# Patient Record
Sex: Male | Born: 1965 | Race: White | Hispanic: No | Marital: Married | Smoking: Never smoker
Health system: Southern US, Community
[De-identification: ages and names within clinical notes are randomized; demographics above are authoritative.]

## PROBLEM LIST (undated history)

## (undated) DIAGNOSIS — I10 Essential (primary) hypertension: Secondary | ICD-10-CM

## (undated) DIAGNOSIS — Z87442 Personal history of urinary calculi: Secondary | ICD-10-CM

## (undated) DIAGNOSIS — M1990 Unspecified osteoarthritis, unspecified site: Secondary | ICD-10-CM

## (undated) HISTORY — PX: WRIST SURGERY: SHX841

## (undated) HISTORY — PX: TOTAL SHOULDER ARTHROPLASTY: SHX126

---

## 2001-11-14 ENCOUNTER — Emergency Department (HOSPITAL_COMMUNITY): Admission: EM | Admit: 2001-11-14 | Discharge: 2001-11-15 | Payer: Self-pay | Admitting: Emergency Medicine

## 2001-11-15 ENCOUNTER — Encounter: Payer: Self-pay | Admitting: Emergency Medicine

## 2006-05-03 ENCOUNTER — Other Ambulatory Visit: Payer: Self-pay

## 2006-05-03 ENCOUNTER — Emergency Department: Payer: Self-pay | Admitting: Emergency Medicine

## 2006-05-04 ENCOUNTER — Ambulatory Visit: Payer: Self-pay | Admitting: Emergency Medicine

## 2008-04-09 ENCOUNTER — Other Ambulatory Visit: Payer: Self-pay

## 2008-04-09 ENCOUNTER — Emergency Department: Payer: Self-pay | Admitting: Emergency Medicine

## 2011-12-30 ENCOUNTER — Inpatient Hospital Stay: Payer: Self-pay | Admitting: Internal Medicine

## 2011-12-30 DIAGNOSIS — G459 Transient cerebral ischemic attack, unspecified: Secondary | ICD-10-CM

## 2011-12-30 LAB — CBC
HGB: 15.2 g/dL (ref 13.0–18.0)
MCH: 30.2 pg (ref 26.0–34.0)
MCHC: 33.5 g/dL (ref 32.0–36.0)
MCV: 90 fL (ref 80–100)
Platelet: 190 10*3/uL (ref 150–440)
RBC: 5.05 10*6/uL (ref 4.40–5.90)
WBC: 7.7 10*3/uL (ref 3.8–10.6)

## 2011-12-30 LAB — HEMOGLOBIN A1C: Hemoglobin A1C: 5.6 % (ref 4.2–6.3)

## 2011-12-30 LAB — CK TOTAL AND CKMB (NOT AT ARMC)
CK, Total: 466 U/L — ABNORMAL HIGH (ref 35–232)
CK-MB: 2.2 ng/mL (ref 0.5–3.6)

## 2011-12-30 LAB — COMPREHENSIVE METABOLIC PANEL
Albumin: 3.5 g/dL (ref 3.4–5.0)
Alkaline Phosphatase: 64 U/L (ref 50–136)
Anion Gap: 5 — ABNORMAL LOW (ref 7–16)
Bilirubin,Total: 0.4 mg/dL (ref 0.2–1.0)
Calcium, Total: 8.3 mg/dL — ABNORMAL LOW (ref 8.5–10.1)
Chloride: 108 mmol/L — ABNORMAL HIGH (ref 98–107)
Co2: 26 mmol/L (ref 21–32)
Creatinine: 0.92 mg/dL (ref 0.60–1.30)
EGFR (African American): >60
Osmolality: 278 (ref 275–301)
Total Protein: 7.4 g/dL (ref 6.4–8.2)

## 2011-12-30 LAB — LIPID PANEL
HDL Cholesterol: 46 mg/dL (ref 40–60)
Ldl Cholesterol, Calc: 102 mg/dL — ABNORMAL HIGH (ref 0–100)
VLDL Cholesterol, Calc: 16 mg/dL (ref 5–40)

## 2011-12-30 LAB — TROPONIN I
Troponin-I: <0.02 ng/mL
Troponin-I: <0.02 ng/mL

## 2013-01-31 ENCOUNTER — Emergency Department: Payer: Self-pay | Admitting: Emergency Medicine

## 2013-08-03 ENCOUNTER — Emergency Department: Payer: Self-pay | Admitting: Emergency Medicine

## 2013-08-03 LAB — COMPREHENSIVE METABOLIC PANEL
Albumin: 3.6 g/dL (ref 3.4–5.0)
Alkaline Phosphatase: 72 U/L
BUN: 14 mg/dL (ref 7–18)
Calcium, Total: 9 mg/dL (ref 8.5–10.1)
Chloride: 105 mmol/L (ref 98–107)
Creatinine: 1.14 mg/dL (ref 0.60–1.30)
EGFR (Non-African Amer.): >60
Glucose: 131 mg/dL — ABNORMAL HIGH (ref 65–99)
Osmolality: 276 (ref 275–301)
Sodium: 137 mmol/L (ref 136–145)
Total Protein: 7.6 g/dL (ref 6.4–8.2)

## 2013-08-03 LAB — CBC
HGB: 15.6 g/dL (ref 13.0–18.0)
MCH: 29.8 pg (ref 26.0–34.0)
MCHC: 33.7 g/dL (ref 32.0–36.0)
RDW: 14.4 % (ref 11.5–14.5)
WBC: 9.4 10*3/uL (ref 3.8–10.6)

## 2013-08-03 LAB — URINALYSIS, COMPLETE
Bilirubin,UR: NEGATIVE
Leukocyte Esterase: NEGATIVE
Nitrite: NEGATIVE
Ph: 5 (ref 4.5–8.0)
RBC,UR: 19 /HPF (ref 0–5)
Specific Gravity: 1.015 (ref 1.003–1.030)
Squamous Epithelial: <1
WBC UR: 2 /HPF (ref 0–5)

## 2013-08-29 ENCOUNTER — Other Ambulatory Visit: Payer: Self-pay | Admitting: Orthopedic Surgery

## 2013-09-04 NOTE — Pre-Procedure Instructions (Signed)
Hector Hancock  09/04/2013   Your procedure is scheduled on:  09/06/13  Report to [facility] short stay admitting at ? AM.  Call this number if you have problems the morning of surgery: [phone]   Remember:   Do not eat food or drink liquids after midnight.   Take these medicines the morning of surgery with A SIP OF WATER: ?   Do not wear jewelry, make-up or nail polish.  Do not wear lotions, powders, or perfumes. You may wear deodorant.  Do not shave 48 hours prior to surgery. Men may shave face and neck.  Do not bring valuables to the hospital.  [facility] is not responsible                  for any belongings or valuables.               Contacts, dentures or bridgework may not be worn into surgery.  Leave suitcase in the car. After surgery it may be brought to your room.  For patients admitted to the hospital, discharge time is determined by your                treatment team.               Patients discharged the day of surgery will not be allowed to drive  home.  Name and phone number of your driver:   Special Instructions: Incentive Spirometry - Practice and bring it with you on the day of surgery. Shower using CHG 2 nights before surgery and the night before surgery.  If you shower the day of surgery use CHG.  Use special wash - you have one bottle of CHG for all showers.  You should use approximately 1/3 of the bottle for each shower.   Please read over the following fact sheets that you were given: Pain Booklet, Coughing and Deep Breathing, Blood Transfusion Information, MRSA Information and Surgical Site Infection Prevention

## 2013-09-05 ENCOUNTER — Encounter (HOSPITAL_COMMUNITY): Payer: Self-pay | Admitting: Pharmacy Technician

## 2013-09-05 ENCOUNTER — Encounter (HOSPITAL_COMMUNITY): Payer: Self-pay | Admitting: Certified Registered Nurse Anesthetist

## 2013-09-05 ENCOUNTER — Encounter (HOSPITAL_COMMUNITY)
Admission: RE | Admit: 2013-09-05 | Discharge: 2013-09-05 | Disposition: A | Discharge status: Home / Self Care | Payer: Worker's Compensation | Source: Ambulatory Visit | Attending: Orthopedic Surgery | Admitting: Orthopedic Surgery

## 2013-09-05 ENCOUNTER — Encounter (HOSPITAL_COMMUNITY): Payer: Self-pay

## 2013-09-05 HISTORY — DX: Essential (primary) hypertension: I10

## 2013-09-05 HISTORY — DX: Personal history of urinary calculi: Z87.442

## 2013-09-05 HISTORY — DX: Unspecified osteoarthritis, unspecified site: M19.90

## 2013-09-05 LAB — COMPREHENSIVE METABOLIC PANEL
ALT: 32 U/L (ref 0–53)
AST: 25 U/L (ref 0–37)
Albumin: 3.6 g/dL (ref 3.5–5.2)
Alkaline Phosphatase: 61 U/L (ref 39–117)
BUN: 13 mg/dL (ref 6–23)
CALCIUM: 8.8 mg/dL (ref 8.4–10.5)
CO2: 25 mEq/L (ref 19–32)
Chloride: 104 mEq/L (ref 96–112)
Creatinine, Ser: 0.97 mg/dL (ref 0.50–1.35)
GFR calc Af Amer: >90 mL/min (ref >90)
GFR calc non Af Amer: >90 mL/min (ref >90)
Glucose, Bld: 115 mg/dL — ABNORMAL HIGH (ref 70–99)
Potassium: 4.2 mEq/L (ref 3.7–5.3)
SODIUM: 142 meq/L (ref 137–147)
TOTAL PROTEIN: 7.2 g/dL (ref 6.0–8.3)
Total Bilirubin: 0.5 mg/dL (ref 0.3–1.2)

## 2013-09-05 LAB — CBC WITH DIFFERENTIAL/PLATELET
BASOS ABS: 0 10*3/uL (ref 0.0–0.1)
BASOS PCT: 0 % (ref 0–1)
EOS ABS: 0.1 10*3/uL (ref 0.0–0.7)
EOS PCT: 1 % (ref 0–5)
HCT: 46.1 % (ref 39.0–52.0)
Hemoglobin: 15.6 g/dL (ref 13.0–17.0)
Lymphocytes Relative: 26 % (ref 12–46)
Lymphs Abs: 1.7 10*3/uL (ref 0.7–4.0)
MCH: 30.4 pg (ref 26.0–34.0)
MCHC: 33.8 g/dL (ref 30.0–36.0)
MCV: 89.7 fL (ref 78.0–100.0)
Monocytes Absolute: 0.7 10*3/uL (ref 0.1–1.0)
Monocytes Relative: 10 % (ref 3–12)
Neutro Abs: 4.2 10*3/uL (ref 1.7–7.7)
Neutrophils Relative %: 63 % (ref 43–77)
PLATELETS: 217 10*3/uL (ref 150–400)
RBC: 5.14 MIL/uL (ref 4.22–5.81)
RDW: 14.4 % (ref 11.5–15.5)
WBC: 6.8 10*3/uL (ref 4.0–10.5)

## 2013-09-05 LAB — URINALYSIS, ROUTINE W REFLEX MICROSCOPIC
Bilirubin Urine: NEGATIVE
Glucose, UA: NEGATIVE mg/dL
HGB URINE DIPSTICK: NEGATIVE
KETONES UR: NEGATIVE mg/dL
Leukocytes, UA: NEGATIVE
Nitrite: NEGATIVE
PROTEIN: NEGATIVE mg/dL
Specific Gravity, Urine: 1.022 (ref 1.005–1.030)
UROBILINOGEN UA: 0.2 mg/dL (ref 0.0–1.0)
pH: 5.5 (ref 5.0–8.0)

## 2013-09-05 LAB — PROTIME-INR
INR: 0.96 (ref 0.00–1.49)
Prothrombin Time: 12.6 seconds (ref 11.6–15.2)

## 2013-09-05 LAB — ABO/RH: ABO/RH(D): A POS

## 2013-09-05 LAB — APTT: aPTT: 28 seconds (ref 24–37)

## 2013-09-05 LAB — SURGICAL PCR SCREEN
MRSA, PCR: NEGATIVE
STAPHYLOCOCCUS AUREUS: NEGATIVE

## 2013-09-05 MED ORDER — CEFAZOLIN SODIUM 10 G IJ SOLR
3.0000 g | INTRAMUSCULAR | Status: AC
  Administered 2013-09-06: 3 g via INTRAVENOUS
  Filled 2013-09-05: qty 3000

## 2013-09-05 NOTE — Pre-Procedure Instructions (Signed)
Hector Hancock  09/05/2013   Your procedure is scheduled on:  Wednesday September 06, 2012 at 11:46 AM.  Report to [facility] Short Stay Admitting at 8:45 AM.  Call this number if you have problems the morning of surgery: [phone]   Remember:   Do not eat food or drink liquids after midnight.   Take these medicines the morning of surgery with A SIP OF WATER: Hydrocodone if needed for pain   Do not wear jewelry.  Do not wear lotions, powders, or colognes. You may wear deodorant.  Men may shave face and neck.  Do not bring valuables to the hospital.  [facility] is not responsible for any belongings or valuables.               Contacts, dentures or bridgework may not be worn into surgery.  Leave suitcase in the car. After surgery it may be brought to your room.  For patients admitted to the hospital, discharge time is determined by your treatment team.               Patients discharged the day of surgery will not be allowed to drive home.  Name and phone number of your driver: Family/Friend  Special Instructions: Shower using CHG tonight and tomorrow morning before surgery.  If you shower the day of surgery use CHG soap only.  Use special wash - you have one bottle of CHG for all showers.  You should use approximately 1/3 of the bottle for each shower.   Please read over the following fact sheets that you were given: Pain Booklet, Coughing and Deep Breathing, Blood Transfusion Information, MRSA Information and Surgical Site Infection Prevention

## 2013-09-05 NOTE — Progress Notes (Signed)
Surgery time has been changed to 8:30am (per the OR Scheduler).........Marland Kitchenwill be here at 6:30AM

## 2013-09-05 NOTE — Progress Notes (Signed)
No Stress test found at [facility].

## 2013-09-05 NOTE — Progress Notes (Signed)
Patient informed Nurse that he had a stress test at [facility] in the last 5 years. Will request records. Patient denied having a cardiac cath or sleep study.

## 2013-09-05 NOTE — Progress Notes (Signed)
09/05/13 1149  OBSTRUCTIVE SLEEP APNEA  Score 4 or greater  Results sent to PCP   This patient has screened at risk for sleep apnea using the STOP Bang tool used during a pre-surgical visit. A score of 4 or greater is at risk for sleep apnea.

## 2013-09-06 ENCOUNTER — Encounter (HOSPITAL_COMMUNITY): Payer: Self-pay | Admitting: *Deleted

## 2013-09-06 ENCOUNTER — Ambulatory Visit (HOSPITAL_COMMUNITY)
Admission: RE | Admit: 2013-09-06 | Discharge: 2013-09-06 | Disposition: A | Discharge status: Home / Self Care | Payer: Worker's Compensation | Source: Ambulatory Visit | Attending: Orthopedic Surgery | Admitting: Orthopedic Surgery

## 2013-09-06 ENCOUNTER — Ambulatory Visit (HOSPITAL_COMMUNITY): Payer: Worker's Compensation | Admitting: Certified Registered Nurse Anesthetist

## 2013-09-06 ENCOUNTER — Encounter (HOSPITAL_COMMUNITY)
Admission: RE | Disposition: A | Discharge status: Home / Self Care | Payer: Self-pay | Source: Ambulatory Visit | Attending: Orthopedic Surgery

## 2013-09-06 ENCOUNTER — Ambulatory Visit (HOSPITAL_COMMUNITY): Payer: Worker's Compensation

## 2013-09-06 ENCOUNTER — Encounter (HOSPITAL_COMMUNITY): Payer: Worker's Compensation | Admitting: Certified Registered Nurse Anesthetist

## 2013-09-06 DIAGNOSIS — Z01818 Encounter for other preprocedural examination: Secondary | ICD-10-CM | POA: Insufficient documentation

## 2013-09-06 DIAGNOSIS — M533 Sacrococcygeal disorders, not elsewhere classified: Secondary | ICD-10-CM | POA: Insufficient documentation

## 2013-09-06 DIAGNOSIS — Z01810 Encounter for preprocedural cardiovascular examination: Secondary | ICD-10-CM | POA: Insufficient documentation

## 2013-09-06 DIAGNOSIS — M129 Arthropathy, unspecified: Secondary | ICD-10-CM | POA: Insufficient documentation

## 2013-09-06 DIAGNOSIS — Z01812 Encounter for preprocedural laboratory examination: Secondary | ICD-10-CM | POA: Insufficient documentation

## 2013-09-06 DIAGNOSIS — I10 Essential (primary) hypertension: Secondary | ICD-10-CM | POA: Insufficient documentation

## 2013-09-06 HISTORY — PX: SACROILIAC JOINT FUSION: SHX6088

## 2013-09-06 SURGERY — SACROILIAC JOINT FUSION
Anesthesia: General | Site: Hip | Laterality: Right

## 2013-09-06 MED ORDER — MIDAZOLAM HCL 2 MG/2ML IJ SOLN
INTRAMUSCULAR | Status: AC
  Filled 2013-09-06: qty 2

## 2013-09-06 MED ORDER — PROPOFOL 10 MG/ML IV BOLUS
INTRAVENOUS | Status: DC | PRN
  Administered 2013-09-06: 270 mg via INTRAVENOUS

## 2013-09-06 MED ORDER — LIDOCAINE HCL (CARDIAC) 20 MG/ML IV SOLN
INTRAVENOUS | Status: AC
  Filled 2013-09-06: qty 5

## 2013-09-06 MED ORDER — HYDROMORPHONE HCL PF 1 MG/ML IJ SOLN
0.2500 mg | INTRAMUSCULAR | Status: DC | PRN
  Administered 2013-09-06 (×2): 0.25 mg via INTRAVENOUS
  Administered 2013-09-06: 0.5 mg via INTRAVENOUS

## 2013-09-06 MED ORDER — POVIDONE-IODINE 7.5 % EX SOLN
Freq: Once | CUTANEOUS | Status: DC
  Filled 2013-09-06: qty 118

## 2013-09-06 MED ORDER — FENTANYL CITRATE 0.05 MG/ML IJ SOLN
INTRAMUSCULAR | Status: DC | PRN
  Administered 2013-09-06: 100 ug via INTRAVENOUS
  Administered 2013-09-06: 50 ug via INTRAVENOUS

## 2013-09-06 MED ORDER — BUPIVACAINE-EPINEPHRINE (PF) 0.25% -1:200000 IJ SOLN
INTRAMUSCULAR | Status: AC
  Filled 2013-09-06: qty 30

## 2013-09-06 MED ORDER — 0.9 % SODIUM CHLORIDE (POUR BTL) OPTIME
TOPICAL | Status: DC | PRN
  Administered 2013-09-06: 1000 mL

## 2013-09-06 MED ORDER — EPINEPHRINE HCL 0.1 MG/ML IJ SOSY
PREFILLED_SYRINGE | INTRAMUSCULAR | Status: AC
  Filled 2013-09-06: qty 10

## 2013-09-06 MED ORDER — MIDAZOLAM HCL 5 MG/5ML IJ SOLN
INTRAMUSCULAR | Status: DC | PRN
  Administered 2013-09-06: 2 mg via INTRAVENOUS

## 2013-09-06 MED ORDER — FENTANYL CITRATE 0.05 MG/ML IJ SOLN: 50.0000 ug | Freq: Once | INTRAMUSCULAR | Status: DC

## 2013-09-06 MED ORDER — PHENYLEPHRINE 40 MCG/ML (10ML) SYRINGE FOR IV PUSH (FOR BLOOD PRESSURE SUPPORT)
PREFILLED_SYRINGE | INTRAVENOUS | Status: AC
  Filled 2013-09-06: qty 10

## 2013-09-06 MED ORDER — ARTIFICIAL TEARS OP OINT
TOPICAL_OINTMENT | OPHTHALMIC | Status: AC
  Filled 2013-09-06: qty 3.5

## 2013-09-06 MED ORDER — HYDROMORPHONE HCL PF 1 MG/ML IJ SOLN
INTRAMUSCULAR | Status: AC
  Filled 2013-09-06: qty 1

## 2013-09-06 MED ORDER — OXYCODONE HCL 5 MG/5ML PO SOLN: 5.0000 mg | Freq: Once | ORAL | Status: AC | PRN

## 2013-09-06 MED ORDER — BUPIVACAINE-EPINEPHRINE PF 0.25-1:200000 % IJ SOLN
INTRAMUSCULAR | Status: DC | PRN
  Administered 2013-09-06: 17 mL

## 2013-09-06 MED ORDER — ROCURONIUM BROMIDE 100 MG/10ML IV SOLN
INTRAVENOUS | Status: DC | PRN
  Administered 2013-09-06: 50 mg via INTRAVENOUS

## 2013-09-06 MED ORDER — SODIUM CHLORIDE 0.9 % IJ SOLN
INTRAMUSCULAR | Status: AC
  Filled 2013-09-06: qty 10

## 2013-09-06 MED ORDER — LACTATED RINGERS IV SOLN
INTRAVENOUS | Status: DC | PRN
  Administered 2013-09-06 (×2): via INTRAVENOUS

## 2013-09-06 MED ORDER — LIDOCAINE HCL (CARDIAC) 20 MG/ML IV SOLN
INTRAVENOUS | Status: DC | PRN
  Administered 2013-09-06: 100 mg via INTRAVENOUS

## 2013-09-06 MED ORDER — ROCURONIUM BROMIDE 50 MG/5ML IV SOLN
INTRAVENOUS | Status: AC
  Filled 2013-09-06: qty 1

## 2013-09-06 MED ORDER — ATROPINE SULFATE 0.1 MG/ML IJ SOLN
INTRAMUSCULAR | Status: AC
  Filled 2013-09-06: qty 10

## 2013-09-06 MED ORDER — OXYCODONE HCL 5 MG PO TABS
ORAL_TABLET | ORAL | Status: AC
  Filled 2013-09-06: qty 1

## 2013-09-06 MED ORDER — EPHEDRINE SULFATE 50 MG/ML IJ SOLN
INTRAMUSCULAR | Status: AC
  Filled 2013-09-06: qty 1

## 2013-09-06 MED ORDER — NEOSTIGMINE METHYLSULFATE 1 MG/ML IJ SOLN
INTRAMUSCULAR | Status: AC
  Filled 2013-09-06: qty 10

## 2013-09-06 MED ORDER — PHENYLEPHRINE HCL 10 MG/ML IJ SOLN
INTRAMUSCULAR | Status: DC | PRN
  Administered 2013-09-06: 40 ug via INTRAVENOUS
  Administered 2013-09-06: 80 ug via INTRAVENOUS
  Administered 2013-09-06: 40 ug via INTRAVENOUS

## 2013-09-06 MED ORDER — GLYCOPYRROLATE 0.2 MG/ML IJ SOLN
INTRAMUSCULAR | Status: DC | PRN
  Administered 2013-09-06: .8 mg via INTRAVENOUS

## 2013-09-06 MED ORDER — PROPOFOL 10 MG/ML IV BOLUS
INTRAVENOUS | Status: AC
  Filled 2013-09-06: qty 20

## 2013-09-06 MED ORDER — FENTANYL CITRATE 0.05 MG/ML IJ SOLN
INTRAMUSCULAR | Status: AC
  Filled 2013-09-06: qty 5

## 2013-09-06 MED ORDER — PHENYLEPHRINE HCL 10 MG/ML IJ SOLN
10.0000 mg | INTRAVENOUS | Status: DC | PRN
  Administered 2013-09-06: 15 ug/min via INTRAVENOUS

## 2013-09-06 MED ORDER — GLYCOPYRROLATE 0.2 MG/ML IJ SOLN
INTRAMUSCULAR | Status: AC
  Filled 2013-09-06: qty 4

## 2013-09-06 MED ORDER — ONDANSETRON HCL 4 MG/2ML IJ SOLN
INTRAMUSCULAR | Status: DC | PRN
  Administered 2013-09-06: 4 mg via INTRAVENOUS

## 2013-09-06 MED ORDER — OXYCODONE HCL 5 MG PO TABS
5.0000 mg | ORAL_TABLET | Freq: Once | ORAL | Status: AC | PRN
  Administered 2013-09-06: 5 mg via ORAL

## 2013-09-06 MED ORDER — MIDAZOLAM HCL 2 MG/2ML IJ SOLN: 1.0000 mg | INTRAMUSCULAR | Status: DC | PRN

## 2013-09-06 MED ORDER — SUCCINYLCHOLINE CHLORIDE 20 MG/ML IJ SOLN
INTRAMUSCULAR | Status: AC
  Filled 2013-09-06: qty 1

## 2013-09-06 MED ORDER — PROMETHAZINE HCL 25 MG/ML IJ SOLN: 6.2500 mg | INTRAMUSCULAR | Status: DC | PRN

## 2013-09-06 MED ORDER — NEOSTIGMINE METHYLSULFATE 1 MG/ML IJ SOLN
INTRAMUSCULAR | Status: DC | PRN
  Administered 2013-09-06: 5 mg via INTRAVENOUS

## 2013-09-06 MED ORDER — ONDANSETRON HCL 4 MG/2ML IJ SOLN
INTRAMUSCULAR | Status: AC
  Filled 2013-09-06: qty 2

## 2013-09-06 SURGICAL SUPPLY — 62 items
APL SKNCLS STERI-STRIP NONHPOA (GAUZE/BANDAGES/DRESSINGS) ×1
BENZOIN TINCTURE PRP APPL 2/3 (GAUZE/BANDAGES/DRESSINGS) ×2 IMPLANT
BLADE SURG 10 STRL SS (BLADE) ×2 IMPLANT
BLADE SURG 11 STRL SS (BLADE) ×2 IMPLANT
BLADE SURG ROTATE 9660 (MISCELLANEOUS) ×1 IMPLANT
CANISTER SUCTION 2500CC (MISCELLANEOUS) ×2 IMPLANT
CLOTH BEACON ORANGE TIMEOUT ST (SAFETY) ×2 IMPLANT
COVER SURGICAL LIGHT HANDLE (MISCELLANEOUS) ×4 IMPLANT
DRAPE C-ARM 42X72 X-RAY (DRAPES) ×1 IMPLANT
DRAPE C-ARMOR (DRAPES) ×2 IMPLANT
DRAPE INCISE IOBAN 66X45 STRL (DRAPES) ×2 IMPLANT
DRAPE POUCH INSTRU U-SHP 10X18 (DRAPES) ×2 IMPLANT
DRAPE SURG 17X23 STRL (DRAPES) ×9 IMPLANT
DRILL CANNULATED 7.5MM (BIT) ×1 IMPLANT
DURAPREP 26ML APPLICATOR (WOUND CARE) ×2 IMPLANT
ELECT CAUTERY BLADE 6.4 (BLADE) ×2 IMPLANT
ELECT REM PT RETURN 9FT ADLT (ELECTROSURGICAL) ×2
ELECTRODE REM PT RTRN 9FT ADLT (ELECTROSURGICAL) ×1 IMPLANT
GAUZE SPONGE 4X4 16PLY XRAY LF (GAUZE/BANDAGES/DRESSINGS) ×2 IMPLANT
GLOVE BIO SURGEON STRL SZ7 (GLOVE) ×2 IMPLANT
GLOVE BIO SURGEON STRL SZ8 (GLOVE) ×2 IMPLANT
GLOVE BIOGEL PI IND STRL 7.0 (GLOVE) ×1 IMPLANT
GLOVE BIOGEL PI IND STRL 8 (GLOVE) ×1 IMPLANT
GLOVE BIOGEL PI INDICATOR 7.0 (GLOVE) ×1
GLOVE BIOGEL PI INDICATOR 8 (GLOVE) ×1
GOWN STRL NON-REIN LRG LVL3 (GOWN DISPOSABLE) ×4 IMPLANT
GOWN STRL REIN XL XLG (GOWN DISPOSABLE) ×2 IMPLANT
K-WIRE 2.4X250 BLUNT TIP (WIRE) ×2
K-WIRE 2.4X300 SHARP (WIRE) ×6
KIT BASIN OR (CUSTOM PROCEDURE TRAY) ×2 IMPLANT
KIT ROOM TURNOVER OR (KITS) ×2 IMPLANT
KWIRE 2.4X250 BLUNT TIP (WIRE) IMPLANT
KWIRE 2.4X300 SHARP (WIRE) IMPLANT
MANIFOLD NEPTUNE II (INSTRUMENTS) ×2 IMPLANT
NDL HYPO 25GX1X1/2 BEV (NEEDLE) ×1 IMPLANT
NEEDLE 22X1 1/2 (OR ONLY) (NEEDLE) ×2 IMPLANT
NEEDLE HYPO 25GX1X1/2 BEV (NEEDLE) ×2 IMPLANT
NS IRRIG 1000ML POUR BTL (IV SOLUTION) ×2 IMPLANT
PACK UNIVERSAL I (CUSTOM PROCEDURE TRAY) ×2 IMPLANT
PAD ARMBOARD 7.5X6 YLW CONV (MISCELLANEOUS) ×5 IMPLANT
PENCIL BUTTON HOLSTER BLD 10FT (ELECTRODE) ×3 IMPLANT
PUTTY DBX 2.5CC (Putty) ×2 IMPLANT
PUTTY DBX 2.5CC DEPUY (Putty) IMPLANT
SCREW SI-LOK 10MM HA SL 50MM (Screw) ×2 IMPLANT
SCREW SI-LOK 10MM HA SL 60MM (Screw) ×1 IMPLANT
SPONGE GAUZE 4X4 12PLY (GAUZE/BANDAGES/DRESSINGS) ×2 IMPLANT
SPONGE GAUZE 4X4 12PLY STER LF (GAUZE/BANDAGES/DRESSINGS) ×1 IMPLANT
SPONGE LAP 18X18 X RAY DECT (DISPOSABLE) ×2 IMPLANT
STAPLER VISISTAT 35W (STAPLE) ×1 IMPLANT
STRIP CLOSURE SKIN 1/2X4 (GAUZE/BANDAGES/DRESSINGS) ×2 IMPLANT
SUT MNCRL AB 4-0 PS2 18 (SUTURE) ×2 IMPLANT
SUT VIC AB 0 CT1 18XCR BRD 8 (SUTURE) ×1 IMPLANT
SUT VIC AB 0 CT1 8-18 (SUTURE) ×2
SUT VIC AB 2-0 CT2 18 VCP726D (SUTURE) ×2 IMPLANT
SYR BULB IRRIGATION 50ML (SYRINGE) ×3 IMPLANT
SYR CONTROL 10ML LL (SYRINGE) ×2 IMPLANT
TAPE CLOTH SURG 4X10 WHT LF (GAUZE/BANDAGES/DRESSINGS) ×1 IMPLANT
TOWEL OR 17X24 6PK STRL BLUE (TOWEL DISPOSABLE) ×2 IMPLANT
TOWEL OR 17X26 10 PK STRL BLUE (TOWEL DISPOSABLE) ×4 IMPLANT
TUBE CONNECTING 12X1/4 (SUCTIONS) ×2 IMPLANT
WATER STERILE IRR 1000ML POUR (IV SOLUTION) ×2 IMPLANT
YANKAUER SUCT BULB TIP NO VENT (SUCTIONS) ×2 IMPLANT

## 2013-09-06 NOTE — H&P (Signed)
PREOPERATIVE H&P  Chief Complaint: right low back pain  HPI: Hector Hancock is a 48 y.o. male who presents with ongoing pain in the right low back. Patient obtained temporary benefit with a R SI injection. Patient failed conservative care.  Past Medical History  Diagnosis Date  . Hypertension   . History of kidney stones   . Arthritis    Past Surgical History  Procedure Laterality Date  . Total shoulder arthroplasty Left    History   Social History  . Marital Status: Married    Spouse Name: N/A    Number of Children: N/A  . Years of Education: N/A   Social History Main Topics  . Smoking status: Never Smoker   . Smokeless tobacco: None  . Alcohol Use: No  . Drug Use: No  . Sexual Activity: None   Other Topics Concern  . None   Social History Narrative  . None   History reviewed. No pertinent family history. No Known Allergies Prior to Admission medications   Medication Sig Start Date End Date Taking? Authorizing Provider  enalapril (VASOTEC) 5 MG tablet Take 5 mg by mouth daily.   Yes Historical Provider, MD  HYDROcodone-acetaminophen (NORCO/VICODIN) 5-325 MG per tablet Take 1 tablet by mouth every 6 (six) hours as needed for moderate pain.   Yes Historical Provider, MD     All other systems have been reviewed and were otherwise negative with the exception of those mentioned in the HPI and as above.  Physical Exam: Filed Vitals:   09/06/13 0730  BP:   Pulse:   Temp: 98.3 F (36.8 C)  Resp:     General: Alert, no acute distress Cardiovascular: No pedal edema Respiratory: No cyanosis, no use of accessory musculature GI: No organomegaly, abdomen is soft and non-tender Skin: No lesions in the area of chief complaint Neurologic: Sensation intact distally Psychiatric: Patient is competent for consent with normal mood and affect Lymphatic: No axillary or cervical lymphadenopathy  MUSCULOSKELETAL: + TTP right low back  Assessment/Plan: Right sided low back  pain Plan for Procedure(s): RIGHT SACROILIAC JOINT FUSION   Emilee Hero, MD 09/06/2013 8:21 AM

## 2013-09-06 NOTE — Anesthesia Preprocedure Evaluation (Addendum)
Anesthesia Evaluation  Patient identified by MRN, date of birth, ID band Patient awake    Reviewed: Allergy & Precautions, H&P , NPO status , Patient's Chart, lab work & pertinent test results  Airway Mallampati: II TM Distance: >3 FB Neck ROM: Full    Dental   Pulmonary  breath sounds clear to auscultation        Cardiovascular hypertension, Rhythm:Regular Rate:Normal     Neuro/Psych    GI/Hepatic   Endo/Other  Morbid obesity  Renal/GU      Musculoskeletal   Abdominal (+) + obese,   Peds  Hematology   Anesthesia Other Findings   Reproductive/Obstetrics                          Anesthesia Physical Anesthesia Plan  ASA: III  Anesthesia Plan: General   Post-op Pain Management:    Induction: Intravenous  Airway Management Planned: Oral ETT and Video Laryngoscope Planned  Additional Equipment:   Intra-op Plan:   Post-operative Plan: Extubation in OR  Informed Consent: I have reviewed the patients History and Physical, chart, labs and discussed the procedure including the risks, benefits and alternatives for the proposed anesthesia with the patient or authorized representative who has indicated his/her understanding and acceptance.     Plan Discussed with: CRNA and Surgeon  Anesthesia Plan Comments:         Anesthesia Quick Evaluation

## 2013-09-06 NOTE — Addendum Note (Signed)
Addendum created 09/06/13 1148 by Edmonia Caprio, CRNA   Modules edited: Anesthesia Responsible Staff

## 2013-09-06 NOTE — Transfer of Care (Signed)
Immediate Anesthesia Transfer of Care Note  Patient: Hector Hancock  Procedure(s) Performed: Procedure(s) with comments: SACROILIAC JOINT FUSION (Right) - Right sided sacroiliac joint fusion  Patient Location: PACU  Anesthesia Type:General  Level of Consciousness: awake, alert  and oriented  Airway & Oxygen Therapy: Patient Spontanous Breathing  Post-op Assessment: Report given to PACU RN  Post vital signs: Reviewed and stable  Complications: No apparent anesthesia complications

## 2013-09-06 NOTE — Anesthesia Postprocedure Evaluation (Signed)
  Anesthesia Post-op Note  Patient: Hector Hancock  Procedure(s) Performed: Procedure(s) with comments: SACROILIAC JOINT FUSION (Right) - Right sided sacroiliac joint fusion  Patient Location: PACU  Anesthesia Type:General  Level of Consciousness: awake  Airway and Oxygen Therapy: Patient Spontanous Breathing  Post-op Pain: mild  Post-op Assessment: Post-op Vital signs reviewed, Patient's Cardiovascular Status Stable, Respiratory Function Stable, Patent Airway, No signs of Nausea or vomiting and Pain level controlled  Post-op Vital Signs: Reviewed and stable  Complications: No apparent anesthesia complications

## 2013-09-06 NOTE — Op Note (Signed)
NAMEQUANTA, ROHER NO.:  [id number]  MEDICAL RECORD NO.:  [id number]  LOCATION:  [facility]                         FACILITY:  [facility]  PHYSICIAN:  Estill Bamberg, MD      DATE OF BIRTH:  [birth year]  DATE OF PROCEDURE:  09/06/2013                              OPERATIVE REPORT   PREOPERATIVE DIAGNOSIS:  Right-sided sacroiliac joint dysfunction.  POSTOPERATIVE DIAGNOSIS:  Right-sided sacroiliac joint dysfunction.  PROCEDURE:  Right-sided sacroiliac joint fusion using the SI-LOK sacroiliac joint fusion system.  SURGEON:  Estill Bamberg, MD.  ASSISTANTJason Coop, PAC.  ANESTHESIA:  General endotracheal anesthesia.  COMPLICATIONS:  None.  DISPOSITION:  Stable.  ESTIMATED BLOOD LOSS:  Minimal.  INDICATIONS FOR PROCEDURE:  Briefly, the patient is a very pleasant 48- year-old male.  He did initially present to me on May 16, 2013, with substantial pain at the right side of his low back.  His pain did radiate into his right leg as well.  I did review an MRI of his lumbar spine, which is negative for any neurologic compression.  Ultimately, we did proceed with a right sacroiliac joint injection.  He did get substantial relief after the injection.  His pain however did recur. His pain ultimately did become constant and severe.  We therefore did discuss proceeding with a right sacroiliac joint fusion procedure.  The patient did fully understand the risks and limitations of the procedure as outlined in my preoperative note.  OPERATIVE DETAILS:  On September 06, 2013, the patient was brought to surgery and general endotracheal anesthesia was administered.  The patient was placed prone on a well-padded flat Jackson bed.  Drills were placed on the patient's chest and hips.  All bony prominences were meticulously padded.  Antibiotics were given.  The region of the right buttock was prepped and draped in the usual sterile fashion and a time- out procedure was  performed.  I then marked out the posterior border of the sacrum.  Using lateral fluoroscopy, I then made a 3 cm incision in line with the posterior border of the sacrum, just inferior to the sacral ala.  I then advanced a total of 3 guidewires over the sacroiliac joint.  Two guidewires were advanced above the S1 foramen, and one was advanced below it.  I did liberally used the lateral and on fluoroscopic views while advancing the guidewires.  I was very pleased with the positioning of the guidewires.  I then drilled over the guidewires and I proceeded with placing the screws, 10 mm in diameter.  I did elect to use slotted screws, which were packed with DBX putty.  The superior most screw was a 10 x 16 mm screw.  The 2nd and 3rd screws were 10 x 50 mm screws.  Again, these were advanced over the guidewires.  I was extremely pleased with the press fit of each of the 3 screws.  The guidewires were then removed.  I was very pleased with the appearance of the radiographs upon review of lateral inlet and outlet views.  At this point, the wound was copiously irrigated.  The fascia was then closed using 0 Vicryl.  The  subcutaneous layer was then closed using 2-0 Vicryl and the skin was then closed using 3-0 Monocryl.  Benzoin and Steri- Strips were applied followed by sterile dressing.  All instrument counts were correct at the termination of the procedure.  Of note, Jason Coop was my assistant throughout the entirety of the procedure, and did aid in essential retraction and suctioning needed throughout the surgery.     Estill Bamberg, MD     MD/MEDQ  D:  09/06/2013  T:  09/06/2013  Job:  [id number]

## 2013-09-06 NOTE — Discharge Instructions (Signed)

## 2013-09-07 LAB — TYPE AND SCREEN
ABO/RH(D): A POS
ANTIBODY SCREEN: NEGATIVE

## 2013-09-08 ENCOUNTER — Encounter (HOSPITAL_COMMUNITY): Payer: Self-pay | Admitting: Orthopedic Surgery

## 2014-06-18 ENCOUNTER — Observation Stay: Payer: Self-pay | Admitting: Internal Medicine

## 2014-06-18 LAB — URINALYSIS, COMPLETE
Bacteria: NONE SEEN
Bilirubin,UR: NEGATIVE
Blood: NEGATIVE
Glucose,UR: NEGATIVE mg/dL (ref 0–75)
Ketone: NEGATIVE
Leukocyte Esterase: NEGATIVE
NITRITE: NEGATIVE
PH: 6 (ref 4.5–8.0)
PROTEIN: NEGATIVE
RBC,UR: 1 /HPF (ref 0–5)
SPECIFIC GRAVITY: 1.021 (ref 1.003–1.030)
WBC UR: 1 /HPF (ref 0–5)

## 2014-06-18 LAB — COMPREHENSIVE METABOLIC PANEL
Albumin: 3.7 g/dL (ref 3.4–5.0)
Alkaline Phosphatase: 68 U/L
Anion Gap: 9 (ref 7–16)
BUN: 15 mg/dL (ref 7–18)
Bilirubin,Total: 0.5 mg/dL (ref 0.2–1.0)
CHLORIDE: 105 mmol/L (ref 98–107)
CO2: 27 mmol/L (ref 21–32)
Calcium, Total: 8.1 mg/dL — ABNORMAL LOW (ref 8.5–10.1)
Creatinine: 1.14 mg/dL (ref 0.60–1.30)
EGFR (Non-African Amer.): >60
GLUCOSE: 119 mg/dL — AB (ref 65–99)
OSMOLALITY: 283 (ref 275–301)
POTASSIUM: 3.8 mmol/L (ref 3.5–5.1)
SGOT(AST): 39 U/L — ABNORMAL HIGH (ref 15–37)
SGPT (ALT): 48 U/L
Sodium: 141 mmol/L (ref 136–145)
TOTAL PROTEIN: 7.5 g/dL (ref 6.4–8.2)

## 2014-06-18 LAB — DRUG SCREEN, URINE
Amphetamines, Ur Screen: NEGATIVE (ref ?–1000)
Barbiturates, Ur Screen: NEGATIVE (ref ?–200)
Benzodiazepine, Ur Scrn: NEGATIVE (ref ?–200)
Cannabinoid 50 Ng, Ur ~~LOC~~: NEGATIVE (ref ?–50)
Cocaine Metabolite,Ur ~~LOC~~: NEGATIVE (ref ?–300)
MDMA (ECSTASY) UR SCREEN: NEGATIVE (ref ?–500)
Methadone, Ur Screen: NEGATIVE (ref ?–300)
Opiate, Ur Screen: NEGATIVE (ref ?–300)
Phencyclidine (PCP) Ur S: NEGATIVE (ref ?–25)
TRICYCLIC, UR SCREEN: NEGATIVE (ref ?–1000)

## 2014-06-18 LAB — LIPID PANEL
CHOLESTEROL: 165 mg/dL (ref 0–200)
HDL Cholesterol: 35 mg/dL — ABNORMAL LOW (ref 40–60)
Ldl Cholesterol, Calc: 91 mg/dL (ref 0–100)
TRIGLYCERIDES: 195 mg/dL (ref 0–200)
VLDL Cholesterol, Calc: 39 mg/dL (ref 5–40)

## 2014-06-18 LAB — TROPONIN I: Troponin-I: <0.02 ng/mL

## 2014-06-18 LAB — CBC
HCT: 47.2 % (ref 40.0–52.0)
HGB: 15.4 g/dL (ref 13.0–18.0)
MCH: 30.3 pg (ref 26.0–34.0)
MCHC: 32.6 g/dL (ref 32.0–36.0)
MCV: 93 fL (ref 80–100)
PLATELETS: 209 10*3/uL (ref 150–440)
RBC: 5.09 10*6/uL (ref 4.40–5.90)
RDW: 14.3 % (ref 11.5–14.5)
WBC: 9.6 10*3/uL (ref 3.8–10.6)

## 2014-06-18 LAB — CK TOTAL AND CKMB (NOT AT ARMC)
CK, TOTAL: 378 U/L — AB
CK-MB: 3 ng/mL (ref 0.5–3.6)

## 2014-06-19 DIAGNOSIS — I6789 Other cerebrovascular disease: Secondary | ICD-10-CM

## 2014-06-19 LAB — LIPID PANEL
Cholesterol: 140 mg/dL (ref 0–200)
HDL: 29 mg/dL — AB (ref 40–60)
Ldl Cholesterol, Calc: 83 mg/dL (ref 0–100)
Triglycerides: 140 mg/dL (ref 0–200)
VLDL CHOLESTEROL, CALC: 28 mg/dL (ref 5–40)

## 2014-12-08 NOTE — H&P (Signed)
PATIENT NAME:  Hector Hancock, Hector Hancock MR#:  [id number] DATE OF BIRTH:  [birth year]  DATE OF ADMISSION:  11/02/201511/09/2013  PRIMARY CARE PHYSICIAN:  Corky Downs, MD  REFERRING EMERGENCY ROOM PHYSICIAN: Sheran Fava. Fanny Bien, MD  CHIEF COMPLAINT: Facial weakness.   HISTORY OF PRESENTING ILLNESS: A 49 year old male with history of hypertension and migraine headache. Since last 3 days he has a severe headache on and off. Yesterday around 4:00 p.m., he started feeling weakness in his left hand and his face was feeling to be drawn towards left side. He also broke out with extensive sweating with this episode yesterday.  He took some rest and after 15 minutes he started feeling a little bit better, but his headache continued and his facial weakness continued, but not as severe as it was before, so he did not do anything, but then today morning again around noon, he had again this episode where his headache got worse and his facial weakness and left hand weakness got worse, so he decided to come to the emergency room. On further questioning, he denies any vision changes. He had migraine episodes in the past with some left arm numbness and after workup, he was told that it is mostly complicated migraine and sent home. He takes his medications regularly and denies any other symptoms today.  REVIEW OF SYSTEMS:  CONSTITUTIONAL: Negative for fever, fatigue, weakness, pain, or weight loss.  EYES: No blurring, double vision, discharge or redness.  EARS, NOSE, THROAT: No tinnitus, ear pain, or hearing loss.  RESPIRATORY: No cough, wheezing, hemoptysis, or shortness of breath.  CARDIOVASCULAR: No chest pain, orthopnea, edema, arrhythmia, palpitation.  GASTROINTESTINAL: No nausea, vomiting, diarrhea, abdominal pain.  GENITOURINARY: No dysuria, hematuria, increased frequency.  ENDOCRINE: No heat or cold intolerance. No excessive sweating.  SKIN: No acne, rashes, or lesions.  MUSCULOSKELETAL: No pain or swelling in the joints.   NEUROLOGICAL: The patient has facial weakness on the right side and numbness and weakness on the left hand with severe headache.  PSYCHIATRIC: No insomnia, bipolar disorder.   PAST MEDICAL HISTORY:  Hypertension and migraines.   PAST SURGICAL HISTORY:  Left shoulder reconstructive surgery after trauma and hip surgery on the right side and has screws.   HOME MEDICATIONS: Hydrochlorothiazide and lisinopril for hypertension.   SOCIAL HISTORY: He lives at home, single, no smoking. No alcohol use. He works in truck driving business.   FAMILY HISTORY: Mother and grandmother had cerebrovascular accident. Grandfather with lung cancer.   PHYSICAL EXAMINATION: VITAL SIGNS: In ER, temperature 98.4, pulse is 104, respirations 18, blood pressure 139/90, and pulse oximetry is 95 on room air.  GENERAL: The patient is fully alert and oriented to time, place, and person. Obese. Does not appear in acute distress.  HEENT: Head and neck atraumatic. Conjunctivae pink. Oral mucosa moist.  NECK: Supple. No JVD. No thyroid tenderness.  RESPIRATORY: Bilateral equal and clear air entry. No crepitation or wheezing.  CARDIOVASCULAR: S1, S2 present, regular. No murmur.  ABDOMEN: Soft, nontender. Bowel sounds present, no organomegaly.  SKIN: No acne, rashes, or lesions.  MUSCULOSKELETAL: No tenderness or swelling. LEGS:  No edema.  NEUROLOGICAL: Right-sided facial weakness appreciated. Extraocular movements preserved. Pupils reactive to light bilaterally. EXTREMITIES:  Power 5/5 in all 4 limbs. No tremor or rigidity. Follows commands.  PSYCHIATRIC: Does not appear in any acute psychiatric illness at this time.   IMPORTANT LABORATORY RESULTS: Glucose 119, BUN 15, creatinine 1.14, sodium 141, potassium is 3.8, chloride 105, CO2 of 27. Calcium is 8.1.  Total protein 7.5, albumin 3.7, bilirubin 0.5, alkaline phosphatase 68, SGOT 39, SGPT 48. Troponin less than 0.02.  Urine for toxicology screen is negative. WBC 9.6,  hemoglobin 15.4, platelet count is 209,000, MCV is 93. Urinalysis is grossly negative.   IMAGING:  CT scan of the head without contrast is normal. X-ray chest, portable, has no edema or consolidation.   ASSESSMENT AND PLAN: A 49 year old male with history of hypertension and migraine, came with headache and the right facial weakness left upper extremity weakness.  1.  Transient ischemic attack. Most likely this is a transient ischemic episode or it would be a complicated migraine as it is with severe, or it may be lacunar infarct. We will admit him to telemetry and follow his MRI, echocardiogram, and carotid Doppler study, and we will get speech and swallow evaluation. We will also check lipid panel and we will give him aspirin and statin for now.  2.  Migraine headache. Will give him magnesium, Fioricet, and Benadryl for now to help him with headache.  3.  Hypertension. We will continue hydrochlorothiazide and lisinopril as he was taking at home.   CODE STATUS: Full code.   TOTAL TIME SPENT ON THIS ADMISSION: 50 minutes.    ____________________________ Hope Pigeon Elisabeth Pigeon, MD vgv:LT D: 06/18/2014 17:45:00 ET T: 06/18/2014 18:50:47 ET JOB#: [id number]  cc: Corky Downs, MD Hope Pigeon Elisabeth Pigeon, MD, <Dictator>     Altamese Dilling MD ELECTRONICALLY SIGNED 06/27/2014 22:18

## 2014-12-08 NOTE — Discharge Summary (Signed)
PATIENT NAME:  Hector Hancock, Hector Hancock MR#:  [id number] DATE OF BIRTH:  [birth year]  DATE OF ADMISSION:  06/18/2014 DATE OF DISCHARGE:  06/19/2014  ADMISSION DIAGNOSIS: Transient ischemic attack.   DISCHARGE DIAGNOSES:  1.  Bell palsy.  2.  Hypertension.  3.  Obesity.   CONSULTATIONS: None.   IMAGING STUDIES:  1.  The patient had carotid ultrasound, which showed no hemodynamically significant stenosis.  2.  MRI of the brain showed no acute abnormalities.  3.  A 2-D echocardiogram showed normal ejection fraction without any abnormalities.   HOSPITAL COURSE: A 49 year old male who presented with right facial droop and headache. For further details, please refer to H and P.   1.  Bell palsy. The patient actually on physical examination his facial nerve root I is actually preserved. He does have a facial palsy but this is due to Bell palsy. His MRI was negative. He will be taking steroids for his Bell palsy.  2.  Hypertension. The patient has known hypertension. The patient will continue his outpatient medications.  3.  Obesity. Encouraged weight loss as tolerated.   PHYSICAL EXAMINATION AT DISCHARGE: VITAL SIGNS: Temperature 97.7, pulse 72, respirations 20, blood pressure 108/68, in room air 97%.  GENERAL: The patient is alert, oriented, not in acute distress.  CARDIOVASCULAR: Regular rate and rhythm. No murmurs, gallops or rubs. PMI was not displaced.  LUNGS: Clear to auscultation without crackles, rales, rhonchi or wheezing. Normal to percussion.  ABDOMEN: Obese, bowel sounds are positive. Hard to appreciate organomegaly due to body habitus. No rebound or guarding.  EXTREMITIES: No clubbing, cyanosis or edema.  NEUROLOGICAL: Cranial nerves II-XII are intact with the exception of cranial nerve #V, facial nerve V2 and V3. He has a facial droop. Strength is preserved, 5/5 in all extremities.   MEDICATIONS AT DISCHARGE:  1.  Prednisone 60 mg for 6 days then tapering by 10 mg every 2 days.  2.   Hydrochlorothiazide/lisinopril 12.5/10 daily.  DISCHARGE DIET: Low sodium.   DISCHARGE ACTIVITY: As tolerated.   DISCHARGE FOLLOWUP: The patient will need to follow up with Dr. Juel Burrow in 1 week.  TIME SPENT: Approximately 35 minutes.   CONDITION: The patient was stable for discharge.   ____________________________ Nikka Hakimian P. Juliene Pina, MD spm:TT D: 06/19/2014 12:05:05 ET T: 06/19/2014 21:28:57 ET JOB#: [id number]  cc: Nolen Lindamood P. Juliene Pina, MD, <Dictator> Corky Downs, MD Janyth Contes Rexford Prevo MD ELECTRONICALLY SIGNED 06/20/2014 13:31

## 2014-12-09 NOTE — H&P (Signed)
PATIENT NAME:  Hector Hancock, Hector Hancock MR#:  [id number] DATE OF BIRTH:  21-Jul-1966  DATE OF ADMISSION:  12/30/2011  REFERRING PHYSICIAN:   Dorothea Glassman, MD  ADMITTING PHYSICIAN: Hector Baas, MD   PRIMARY CARE PHYSICIAN: None    CHIEF COMPLAINT: Left arm numbness.   HISTORY OF PRESENT ILLNESS: Hector Hancock is a 49 year old obese Caucasian male with no significant past medical history as the patient has not seen a physician lately, presents to the hospital complaining of left-sided numbness and mild weakness that started yesterday. The patient says he was at home and doing his routine chores, and all of a sudden he had a headache and felt tingling numbness of his left arm and also face. It was intermittent all day yesterday with arm, leg and facial numbness with slight weakness this morning. He denies any changes in gait. No visual changes. No trouble swallowing or speaking. At the time of the examination, only mild left arm weakness persist, but other symptoms have mostly resolved. No prior similar history. No history of strokes or mini strokes but does have a strong family history of cerebrovascular accident.    PAST MEDICAL HISTORY: None.  PAST SURGICAL HISTORY: Left shoulder reconstructive surgery after trauma.  ALLERGIES: Codeine.   HOME MEDICATIONS:  None.   SOCIAL HISTORY: He lives at home. He is single. No smoking or alcohol use.   FAMILY HISTORY: Mom with cerebrovascular accident, diabetes. Maternal grandmother with cerebrovascular accident. Grandfather with lung cancer.    REVIEW OF SYSTEMS: CONSTITUTIONAL: No fever, fatigue, or weakness. EYES: No blurred vision, double vision, pain, inflammation, or glaucoma. ENT: No tinnitus, ear pain, hearing loss, epistaxis or discharge. RESPIRATORY: No cough, wheeze, hemoptysis, or chronic obstructive pulmonary disease. CARDIOVASCULAR: No chest pain, orthopnea, edema, arrhythmia, palpitations, or syncope. GI: No nausea, vomiting, diarrhea, abdominal pain,  hematemesis, or melena. GENITOURINARY: No dysuria, hematuria, renal calculus, frequency, or incontinence. ENDOCRINE: No polyuria, nocturia, thyroid problems, heat or cold intolerance. HEMATOLOGY: No anemia, easy bruising or bleeding. SKIN: No acne, rash, or lesions. MUSCULOSKELETAL: No neck, back, shoulder pain, arthritis, or gout. NEUROLOGICAL: Positive for left-sided numbness and mild weakness. No prior history of cerebrovascular accident or seizures. PSYCHOLOGICAL: No anxiety, insomnia, or depression.   PHYSICAL EXAMINATION:  VITAL SIGNS: Temperature 96.9 degrees Fahrenheit, pulse 80, respirations 18, blood pressure 168/97, pulse oximetry 100% on room air.   GENERAL: A heavily built, well nourished male sitting in bed, not in any acute distress.   HEENT: Normocephalic, atraumatic. Pupils are equal, round, reacting to light. Anicteric sclerae. Extraocular movements are intact. Oropharynx is clear without erythema, mass, or exudates.   NECK: Supple. No thyromegaly, JVD, or carotid bruits. No lymphadenopathy.   LUNGS: Clear to auscultation bilaterally. No wheeze or crackles. No use of accessory muscles for breathing.   CARDIOVASCULAR: S1, S2 regular rate and rhythm. No murmurs, rubs, or gallops.   ABDOMEN: Soft, obese, nontender, nondistended. No hepatosplenomegaly. Normal bowel sounds.   EXTREMITIES: He does have 1+ edema of both legs up to his knees. Dorsalis pedis pulses are palpable. No clubbing or cyanosis.  LYMPHATICS: No cervical lymphadenopathy.   NEUROLOGICAL: Cranial nerves are intact. Strength in the left upper extremity is 4+ out of 5, especially in the distal muscles, and 5 out of 5 in all other three extremities. He has 2+ biceps and knee jerks present bilaterally.  PSYCHOLOGICAL: The patient is awake, alert, oriented x3.   LABORATORY, DIAGNOSTIC AND RADIOLOGICAL DATA:  WBC 7.7, hemoglobin 15.2, hematocrit 45.5, platelet count 190.0. Sodium 139,  potassium 4.1, chloride 108,  bicarbonate 26, BUN 14, creatinine 0.92, calcium 8.3, glucose 107.0.  ALT 35, AST 31, alkaline phosphatase 64, total bilirubin 0.4, albumin 3.5.  CK 466, CK-MB 2.5. Troponin less than 0.02.  CT of the head without contrast showing no acute intracranial process. EKG: Normal sinus rhythm, heart rate of 79, no acute ST-T wave abnormalities. T wave inversions in V1 and V2, not significant.   ASSESSMENT AND PLAN: The patient is a 49 year old obese male with known past medical history and family history of cerebrovascular accident, comes with left-sided weakness and numbness since yesterday.   1. Possible early cerebrovascular accident: We will admit the patient to telemetry. Continue neuro checks. Get MRI of the brain, carotid Dopplers, echo. Start on low-dose aspirin and also statin. Get Physical Therapy consult and place on deep vein thrombosis prophylaxis.  2. Hypertension: No known history. We will start IV hydralazine p.r.n., permissive hypertension for cerebrovascular accident. We will not drop it suddenly. If it remains consistently elevated tomorrow, he can be started on oral medications.  3. Bilateral lower extremity edema: He is obese, could be venous impediment versus congestive heart failure. Echocardiogram will be ordered, so follow up on the echocardiogram results and treat  accordingly.  4. A lipid profile has been ordered and follow-up LDL. Statin has been started.  5. Other: He will need a primary care physician prior to discharge.   CODE STATUS:  FULL CODE.     TIME SPENT ON ADMISSION: 50 minutes.  ____________________________ Hector Baas, MD rk:cbb D: 12/30/2011 14:55:47 ET T: 12/30/2011 15:15:22 ET JOB#: [id number]  cc: Hector Baas, MD, <Dictator> Hector Baas MD ELECTRONICALLY SIGNED 12/31/2011 13:07

## 2014-12-09 NOTE — Discharge Summary (Signed)
PATIENT NAME:  Hector Hancock, Hector Hancock MR#:  [id number] DATE OF BIRTH:  31-Mar-1966  DATE OF ADMISSION:  12/30/2011 DATE OF DISCHARGE:  12/31/2011  ADMITTING DIAGNOSIS: Left arm numbness, some left-sided facial numbness.   DISCHARGE DIAGNOSES:  1. Left arm weakness, possibly transient ischemic attack/cerebrovascular accident. MRI of the brain was negative. 2. Mild hypertension.  3. Slightly elevated cholesterol.   CONSULTANTS: None.   PERTINENT LABS AND EVALUATIONS: EKG showed normal sinus rhythm without any ST-T wave changes. Cholesterol 164, triglycerides 78, HDL 48, LDL 102. Hemoglobin A1c 5.6. BMP: Glucose 107, BUN 14, creatinine 0.92, sodium 139, potassium 4.1, chloride 108, CO2 28. LFTs were normal. WBC 7.7, hemoglobin 15.2, platelet count 190. CT scan of the head shows no acute abnormality. Bilateral ultrasound shows no significant stenosis. Echocardiogram of the heart shows no source of emboli, slightly elevated left atrium. MRI of the brain shows no acute infarct. A few septal areas of hyperintensity in the subcortical white matter and pons, nonspecific for patient's age.   HOSPITAL COURSE: Please see History and Physical done by the admitting physician. The patient is a 49 year old white male with no significant medical history, has not seen any physician, who comes to the hospital with left-sided numbness and mild weakness in the left arm. The patient was seen in the ED, had a CT scan of the head which was negative. He was admitted for further evaluation. By this morning his symptoms had significantly improved. MRI of the brain showed no acute cerebrovascular accident. His carotid Dopplers did not show any significant stenosis. Echocardiogram was unrevealing. The patient currently is doing much better and is stable for discharge.   DISCHARGE MEDICATIONS:  1. Aspirin 81, 1 tab p.o. daily.  2. Enalapril 2.5 mg p.o. daily.  3. Lipitor 10 mg daily.   HOME O2: None.   DIET: Low sodium, low fat.    ACTIVITY: As tolerated.   TIMEFRAME FOR FOLLOWUP: 1 to 2 weeks with Dr. Juel Burrow in the [facility] office.     TIME SPENT: 35 minutes.   ____________________________ Lacie Scotts Allena Katz, MD shp:bjt D: 12/31/2011 15:20:40 ET T: 01/01/2012 10:22:47 ET JOB#: [id number]  cc: Keyondre Hepburn H. Allena Katz, MD, <Dictator> Corky Downs, MD Charise Carwin MD ELECTRONICALLY SIGNED 01/01/2012 14:46

## 2015-11-15 ENCOUNTER — Telehealth: Payer: Self-pay | Admitting: Gastroenterology

## 2015-11-15 NOTE — Telephone Encounter (Signed)
colonoscopy

## 2015-12-03 ENCOUNTER — Telehealth: Payer: Self-pay | Admitting: Gastroenterology

## 2015-12-03 NOTE — Telephone Encounter (Signed)
Returned pt's call .LVM for pt to return my call.

## 2015-12-03 NOTE — Telephone Encounter (Signed)
Patient left a voice message returning your phone call regarding a colonoscopy °

## 2015-12-03 NOTE — Telephone Encounter (Signed)
LVM for pt to return my call.

## 2015-12-10 ENCOUNTER — Telehealth: Payer: Self-pay | Admitting: Gastroenterology

## 2015-12-10 NOTE — Telephone Encounter (Signed)
Please call the patient between 8 -9 to triage. This is about the only time he can talk

## 2015-12-10 NOTE — Telephone Encounter (Signed)
Left message for pt to return my call. Letter mailed.

## 2015-12-11 ENCOUNTER — Other Ambulatory Visit: Payer: Self-pay

## 2015-12-11 ENCOUNTER — Telehealth: Payer: Self-pay

## 2015-12-11 NOTE — Telephone Encounter (Signed)
Gastroenterology Pre-Procedure Review  Request Date: 12/27/15 Requesting Physician: Dr.   PATIENT REVIEW QUESTIONS: The patient responded to the following health history questions as indicated:    1. Are you having any GI issues? no 2. Do you have a personal history of Polyps? no 3. Do you have a family history of Colon Cancer or Polyps? no 4. Diabetes Mellitus? no 5. Joint replacements in the past 12 months?no 6. Major health problems in the past 3 months?no 7. Any artificial heart valves, MVP, or defibrillator?no    MEDICATIONS & ALLERGIES:    Patient reports the following regarding taking any anticoagulation/antiplatelet therapy:   Plavix, Coumadin, Eliquis, Xarelto, Lovenox, Pradaxa, Brilinta, or Effient? no Aspirin? no  Patient confirms/reports the following medications:  Current Outpatient Prescriptions  Medication Sig Dispense Refill  . enalapril (VASOTEC) 5 MG tablet Take 5 mg by mouth daily.     No current facility-administered medications for this visit.    Patient confirms/reports the following allergies:  No Known Allergies  No orders of the defined types were placed in this encounter.    AUTHORIZATION INFORMATION Primary Insurance: 1D#: Group #:  Secondary Insurance: 1D#: Group #:  SCHEDULE INFORMATION: Date: 12/27/15 Time: Location: [facility]

## 2015-12-11 NOTE — Telephone Encounter (Signed)
Pt scheduled for screening colonoscopy at [facility] on 12/27/15. Instructs/rx mailed. Please precert.

## 2015-12-20 ENCOUNTER — Encounter: Payer: Self-pay | Admitting: *Deleted

## 2015-12-26 NOTE — Discharge Instructions (Signed)

## 2015-12-27 ENCOUNTER — Ambulatory Visit: Payer: BLUE CROSS/BLUE SHIELD | Admitting: Anesthesiology

## 2015-12-27 ENCOUNTER — Ambulatory Visit
Admission: RE | Admit: 2015-12-27 | Discharge: 2015-12-27 | Disposition: A | Discharge status: Home / Self Care | Payer: BLUE CROSS/BLUE SHIELD | Source: Ambulatory Visit | Attending: Gastroenterology | Admitting: Gastroenterology

## 2015-12-27 ENCOUNTER — Encounter
Admission: RE | Disposition: A | Discharge status: Home / Self Care | Payer: Self-pay | Source: Ambulatory Visit | Attending: Gastroenterology

## 2015-12-27 DIAGNOSIS — Z1211 Encounter for screening for malignant neoplasm of colon: Secondary | ICD-10-CM | POA: Insufficient documentation

## 2015-12-27 DIAGNOSIS — K635 Polyp of colon: Secondary | ICD-10-CM | POA: Diagnosis not present

## 2015-12-27 DIAGNOSIS — Z87442 Personal history of urinary calculi: Secondary | ICD-10-CM | POA: Diagnosis not present

## 2015-12-27 DIAGNOSIS — Z833 Family history of diabetes mellitus: Secondary | ICD-10-CM | POA: Insufficient documentation

## 2015-12-27 DIAGNOSIS — Z79899 Other long term (current) drug therapy: Secondary | ICD-10-CM | POA: Insufficient documentation

## 2015-12-27 DIAGNOSIS — K640 First degree hemorrhoids: Secondary | ICD-10-CM | POA: Insufficient documentation

## 2015-12-27 DIAGNOSIS — M1990 Unspecified osteoarthritis, unspecified site: Secondary | ICD-10-CM | POA: Diagnosis not present

## 2015-12-27 DIAGNOSIS — Z981 Arthrodesis status: Secondary | ICD-10-CM | POA: Insufficient documentation

## 2015-12-27 DIAGNOSIS — I10 Essential (primary) hypertension: Secondary | ICD-10-CM | POA: Diagnosis not present

## 2015-12-27 DIAGNOSIS — D125 Benign neoplasm of sigmoid colon: Secondary | ICD-10-CM | POA: Diagnosis not present

## 2015-12-27 DIAGNOSIS — Z96612 Presence of left artificial shoulder joint: Secondary | ICD-10-CM | POA: Insufficient documentation

## 2015-12-27 HISTORY — PX: POLYPECTOMY: SHX5525

## 2015-12-27 HISTORY — PX: COLONOSCOPY WITH PROPOFOL: SHX5780

## 2015-12-27 SURGERY — COLONOSCOPY WITH PROPOFOL
Anesthesia: Monitor Anesthesia Care

## 2015-12-27 MED ORDER — LIDOCAINE HCL (CARDIAC) 20 MG/ML IV SOLN
INTRAVENOUS | Status: DC | PRN
  Administered 2015-12-27: 40 mg via INTRAVENOUS

## 2015-12-27 MED ORDER — PROPOFOL 10 MG/ML IV BOLUS
INTRAVENOUS | Status: DC | PRN
  Administered 2015-12-27: 70 mg via INTRAVENOUS
  Administered 2015-12-27 (×3): 30 mg via INTRAVENOUS
  Administered 2015-12-27: 10 mg via INTRAVENOUS
  Administered 2015-12-27: 20 mg via INTRAVENOUS

## 2015-12-27 MED ORDER — SODIUM CHLORIDE 0.9 % IV SOLN: INTRAVENOUS | Status: DC

## 2015-12-27 MED ORDER — LACTATED RINGERS IV SOLN
INTRAVENOUS | Status: DC
  Administered 2015-12-27: 10:00:00 via INTRAVENOUS

## 2015-12-27 MED ORDER — SIMETHICONE 40 MG/0.6ML PO SUSP
ORAL | Status: DC | PRN
  Administered 2015-12-27: 10:00:00

## 2015-12-27 SURGICAL SUPPLY — 22 items
CANISTER SUCT 1200ML W/VALVE (MISCELLANEOUS) ×3 IMPLANT
CLIP HMST 235XBRD CATH ROT (MISCELLANEOUS) IMPLANT
CLIP RESOLUTION 360 11X235 (MISCELLANEOUS)
FCP ESCP3.2XJMB 240X2.8X (MISCELLANEOUS)
FORCEPS BIOP RAD 4 LRG CAP 4 (CUTTING FORCEPS) ×1 IMPLANT
FORCEPS BIOP RJ4 240 W/NDL (MISCELLANEOUS)
FORCEPS ESCP3.2XJMB 240X2.8X (MISCELLANEOUS) IMPLANT
GOWN CVR UNV OPN BCK APRN NK (MISCELLANEOUS) ×4 IMPLANT
GOWN ISOL THUMB LOOP REG UNIV (MISCELLANEOUS) ×6
INJECTOR VARIJECT VIN23 (MISCELLANEOUS) IMPLANT
KIT DEFENDO VALVE AND CONN (KITS) IMPLANT
KIT ENDO PROCEDURE OLY (KITS) ×3 IMPLANT
MARKER SPOT ENDO TATTOO 5ML (MISCELLANEOUS) IMPLANT
PAD GROUND ADULT SPLIT (MISCELLANEOUS) IMPLANT
PROBE APC STR FIRE (PROBE) IMPLANT
SNARE SHORT THROW 13M SML OVAL (MISCELLANEOUS) IMPLANT
SNARE SHORT THROW 30M LRG OVAL (MISCELLANEOUS) IMPLANT
SNARE SNG USE RND 15MM (INSTRUMENTS) IMPLANT
SPOT EX ENDOSCOPIC TATTOO (MISCELLANEOUS)
TRAP ETRAP POLY (MISCELLANEOUS) IMPLANT
VARIJECT INJECTOR VIN23 (MISCELLANEOUS)
WATER STERILE IRR 250ML POUR (IV SOLUTION) ×3 IMPLANT

## 2015-12-27 NOTE — Anesthesia Preprocedure Evaluation (Signed)
Anesthesia Evaluation  Patient identified by MRN, date of birth, ID band  Reviewed: Allergy & Precautions, H&P , NPO status , Patient's Chart, lab work & pertinent test results  Airway Mallampati: III  TM Distance: >3 FB Neck ROM: full    Dental no notable dental hx.    Pulmonary    Pulmonary exam normal        Cardiovascular hypertension,  Rhythm:regular Rate:Normal     Neuro/Psych    GI/Hepatic   Endo/Other  Morbid obesity  Renal/GU      Musculoskeletal   Abdominal   Peds  Hematology   Anesthesia Other Findings   Reproductive/Obstetrics                             Anesthesia Physical Anesthesia Plan  ASA: II  Anesthesia Plan: MAC   Post-op Pain Management:    Induction:   Airway Management Planned:   Additional Equipment:   Intra-op Plan:   Post-operative Plan:   Informed Consent: I have reviewed the patients History and Physical, chart, labs and discussed the procedure including the risks, benefits and alternatives for the proposed anesthesia with the patient or authorized representative who has indicated his/her understanding and acceptance.     Plan Discussed with: CRNA  Anesthesia Plan Comments:         Anesthesia Quick Evaluation

## 2015-12-27 NOTE — H&P (Signed)
  [facility]  [address] [place], [place] [address] Phone: [phone] Fax : [phone]  Primary Care Physician:  No primary care provider on file. Primary Gastroenterologist:  Dr. Servando Snare  Pre-Procedure History & Physical: HPI:  Hector Hancock is a 50 y.o. male is here for a screening colonoscopy.   Past Medical History  Diagnosis Date  . Hypertension   . History of kidney stones   . Arthritis     Past Surgical History  Procedure Laterality Date  . Total shoulder arthroplasty Left   . Sacroiliac joint fusion Right 09/06/2013    Procedure: SACROILIAC JOINT FUSION;  Surgeon: Emilee Hero, MD;  Location: [facility] OR;  Service: Orthopedics;  Laterality: Right;  Right sided sacroiliac joint fusion    Prior to Admission medications   Medication Sig Start Date End Date Taking? Authorizing Provider  enalapril (VASOTEC) 5 MG tablet Take 5 mg by mouth daily.   Yes Historical Provider, MD    Allergies as of 12/11/2015  . (No Known Allergies)    Family History  Problem Relation Age of Onset  . Diabetes Mother   . Diabetes Sister   . Diabetes Sister     Social History   Social History  . Marital Status: Married    Spouse Name: N/A  . Number of Children: N/A  . Years of Education: N/A   Occupational History  . Not on file.   Social History Main Topics  . Smoking status: Never Smoker   . Smokeless tobacco: Never Used  . Alcohol Use: No  . Drug Use: No  . Sexual Activity: Not on file   Other Topics Concern  . Not on file   Social History Narrative    Review of Systems: See HPI, otherwise negative ROS  Physical Exam: BP 124/75 mmHg  Pulse 85  Temp(Src) 98.1 F (36.7 C) (Temporal)  Resp 17  Ht 6\' 2"  (1.88 m)  Wt 331 lb (150.141 kg)  BMI 42.48 kg/m2  SpO2 97% General:   Alert,  pleasant and cooperative in NAD Head:  Normocephalic and atraumatic. Neck:  Supple; no masses or thyromegaly. Lungs:  Clear throughout to auscultation.     Heart:  Regular rate and rhythm. Abdomen:  Soft, nontender and nondistended. Normal bowel sounds, without guarding, and without rebound.   Neurologic:  Alert and  oriented x4;  grossly normal neurologically.  Impression/Plan: Hector Hancock is now here to undergo a screening colonoscopy.  Risks, benefits, and alternatives regarding colonoscopy have been reviewed with the patient.  Questions have been answered.  All parties agreeable.

## 2015-12-27 NOTE — Anesthesia Postprocedure Evaluation (Signed)
Anesthesia Post Note  Patient: Hector Hancock  Procedure(s) Performed: Procedure(s) (LRB): COLONOSCOPY WITH PROPOFOL (N/A) POLYPECTOMY  Patient location during evaluation: PACU Anesthesia Type: MAC Level of consciousness: awake and alert and oriented Pain management: satisfactory to patient Vital Signs Assessment: post-procedure vital signs reviewed and stable Respiratory status: spontaneous breathing, nonlabored ventilation and respiratory function stable Cardiovascular status: blood pressure returned to baseline and stable Postop Assessment: Adequate PO intake and No signs of nausea or vomiting Anesthetic complications: no    Cherly Beach

## 2015-12-27 NOTE — Anesthesia Procedure Notes (Signed)
Procedure Name: MAC Performed by: Natalyah Cummiskey Pre-anesthesia Checklist: Patient identified, Emergency Drugs available, Suction available, Timeout performed and Patient being monitored Patient Re-evaluated:Patient Re-evaluated prior to inductionOxygen Delivery Method: Nasal cannula Placement Confirmation: positive ETCO2       

## 2015-12-27 NOTE — Op Note (Signed)
[facility] Gastroenterology Patient Name: Hector Hancock Procedure Date: 12/27/2015 10:10 AM MRN: [id number] Account #: [id number] Date of Birth: Jan 08, 1966 Admit Type: Outpatient Age: 50 Room: [facility] OR ROOM 01 Gender: Male Note Status: Finalized Procedure:            Colonoscopy Indications:          Screening for colorectal malignant neoplasm Providers:            Midge Minium, MD Referring MD:         Corky Downs, MD (Referring MD) Medicines:            Propofol per Anesthesia Complications:        No immediate complications. Procedure:            Pre-Anesthesia Assessment:                       - Prior to the procedure, a History and Physical was                        performed, and patient medications and allergies were                        reviewed. The patient's tolerance of previous                        anesthesia was also reviewed. The risks and benefits of                        the procedure and the sedation options and risks were                        discussed with the patient. All questions were                        answered, and informed consent was obtained. Prior                        Anticoagulants: The patient has taken no previous                        anticoagulant or antiplatelet agents. ASA Grade                        Assessment: II - A patient with mild systemic disease.                        After reviewing the risks and benefits, the patient was                        deemed in satisfactory condition to undergo the                        procedure.                       After obtaining informed consent, the colonoscope was                        passed under direct vision. Throughout the procedure,  the patient's blood pressure, pulse, and oxygen                        saturations were monitored continuously. The Olympus CF                        H180AL colonoscope (S#: [id number]) was introduced through                   the anus and advanced to the the cecum, identified by                        appendiceal orifice and ileocecal valve. The                        colonoscopy was performed without difficulty. The                        patient tolerated the procedure well. The quality of                        the bowel preparation was excellent. Findings:      The perianal and digital rectal examinations were normal.      Two sessile polyps were found in the sigmoid colon. The polyps were 1 to       3 mm in size. These polyps were removed with a cold biopsy forceps.       Resection and retrieval were complete.      Non-bleeding internal hemorrhoids were found during retroflexion. The       hemorrhoids were Grade I (internal hemorrhoids that do not prolapse). Impression:           - Two 1 to 3 mm polyps in the sigmoid colon, removed                        with a cold biopsy forceps. Resected and retrieved.                       - Non-bleeding internal hemorrhoids. Recommendation:       - Repeat colonoscopy in 5 years if polyp adenoma and 10                        years if hyperplastic Procedure Code(s):    --- Professional ---                       [phone], Colonoscopy, flexible; with biopsy, single or                        multiple Diagnosis Code(s):    --- Professional ---                       Z12.11, Encounter for screening for malignant neoplasm                        of colon                       D12.5, Benign neoplasm of sigmoid colon CPT copyright 2016 [employer] Association. All rights reserved. The codes documented in this report are preliminary and upon coder review  may  be revised to meet current compliance requirements. Midge Minium, MD 12/27/2015 10:23:42 AM This report has been signed electronically. Number of Addenda: 0 Note Initiated On: 12/27/2015 10:10 AM Scope Withdrawal Time: 0 hours 6 minutes 52 seconds  Total Procedure Duration: 0 hours 9 minutes 1 second        [facility]

## 2015-12-27 NOTE — Transfer of Care (Signed)
Immediate Anesthesia Transfer of Care Note  Patient: Hector Hancock  Procedure(s) Performed: Procedure(s): COLONOSCOPY WITH PROPOFOL (N/A) POLYPECTOMY  Patient Location: PACU  Anesthesia Type: MAC  Level of Consciousness: awake, alert  and patient cooperative  Airway and Oxygen Therapy: Patient Spontanous Breathing and Patient connected to supplemental oxygen  Post-op Assessment: Post-op Vital signs reviewed, Patient's Cardiovascular Status Stable, Respiratory Function Stable, Patent Airway and No signs of Nausea or vomiting  Post-op Vital Signs: Reviewed and stable  Complications: No apparent anesthesia complications

## 2015-12-30 ENCOUNTER — Encounter: Payer: Self-pay | Admitting: Gastroenterology

## 2015-12-31 ENCOUNTER — Encounter: Payer: Self-pay | Admitting: Gastroenterology

## 2016-01-16 DIAGNOSIS — I10 Essential (primary) hypertension: Secondary | ICD-10-CM | POA: Diagnosis not present

## 2016-01-16 DIAGNOSIS — Z833 Family history of diabetes mellitus: Secondary | ICD-10-CM | POA: Diagnosis not present

## 2016-01-16 DIAGNOSIS — M2550 Pain in unspecified joint: Secondary | ICD-10-CM | POA: Diagnosis not present

## 2016-01-16 DIAGNOSIS — Z8261 Family history of arthritis: Secondary | ICD-10-CM | POA: Diagnosis not present

## 2016-01-16 DIAGNOSIS — Z885 Allergy status to narcotic agent status: Secondary | ICD-10-CM | POA: Diagnosis not present

## 2016-01-16 DIAGNOSIS — M19041 Primary osteoarthritis, right hand: Secondary | ICD-10-CM | POA: Diagnosis not present

## 2016-01-16 DIAGNOSIS — M65849 Other synovitis and tenosynovitis, unspecified hand: Secondary | ICD-10-CM | POA: Diagnosis not present

## 2016-01-16 DIAGNOSIS — M19042 Primary osteoarthritis, left hand: Secondary | ICD-10-CM | POA: Diagnosis not present

## 2016-05-15 DIAGNOSIS — Z23 Encounter for immunization: Secondary | ICD-10-CM | POA: Diagnosis not present

## 2016-06-11 DIAGNOSIS — R062 Wheezing: Secondary | ICD-10-CM | POA: Diagnosis not present

## 2016-06-11 DIAGNOSIS — J209 Acute bronchitis, unspecified: Secondary | ICD-10-CM | POA: Diagnosis not present

## 2016-10-14 DIAGNOSIS — M1990 Unspecified osteoarthritis, unspecified site: Secondary | ICD-10-CM | POA: Diagnosis not present

## 2016-11-20 DIAGNOSIS — R42 Dizziness and giddiness: Secondary | ICD-10-CM | POA: Diagnosis not present

## 2016-11-20 DIAGNOSIS — E8881 Metabolic syndrome: Secondary | ICD-10-CM | POA: Diagnosis not present

## 2016-11-20 DIAGNOSIS — I10 Essential (primary) hypertension: Secondary | ICD-10-CM | POA: Diagnosis not present

## 2016-12-10 DIAGNOSIS — M069 Rheumatoid arthritis, unspecified: Secondary | ICD-10-CM | POA: Diagnosis not present

## 2016-12-10 DIAGNOSIS — Z79899 Other long term (current) drug therapy: Secondary | ICD-10-CM | POA: Diagnosis not present

## 2017-02-22 DIAGNOSIS — R42 Dizziness and giddiness: Secondary | ICD-10-CM | POA: Diagnosis not present

## 2017-02-22 DIAGNOSIS — I83899 Varicose veins of unspecified lower extremities with other complications: Secondary | ICD-10-CM | POA: Diagnosis not present

## 2017-02-22 DIAGNOSIS — I10 Essential (primary) hypertension: Secondary | ICD-10-CM | POA: Diagnosis not present

## 2017-02-22 DIAGNOSIS — E669 Obesity, unspecified: Secondary | ICD-10-CM | POA: Diagnosis not present

## 2017-03-12 DIAGNOSIS — M19041 Primary osteoarthritis, right hand: Secondary | ICD-10-CM | POA: Diagnosis not present

## 2017-03-12 DIAGNOSIS — Z79899 Other long term (current) drug therapy: Secondary | ICD-10-CM | POA: Diagnosis not present

## 2017-03-12 DIAGNOSIS — M1990 Unspecified osteoarthritis, unspecified site: Secondary | ICD-10-CM | POA: Diagnosis not present

## 2017-03-12 DIAGNOSIS — M19042 Primary osteoarthritis, left hand: Secondary | ICD-10-CM | POA: Diagnosis not present

## 2017-03-12 DIAGNOSIS — M1380 Other specified arthritis, unspecified site: Secondary | ICD-10-CM | POA: Diagnosis not present

## 2017-03-12 DIAGNOSIS — M65841 Other synovitis and tenosynovitis, right hand: Secondary | ICD-10-CM | POA: Diagnosis not present

## 2017-03-12 DIAGNOSIS — M2550 Pain in unspecified joint: Secondary | ICD-10-CM | POA: Diagnosis not present

## 2017-03-12 DIAGNOSIS — Z791 Long term (current) use of non-steroidal anti-inflammatories (NSAID): Secondary | ICD-10-CM | POA: Diagnosis not present

## 2017-06-07 DIAGNOSIS — Z23 Encounter for immunization: Secondary | ICD-10-CM | POA: Diagnosis not present

## 2017-06-11 DIAGNOSIS — R42 Dizziness and giddiness: Secondary | ICD-10-CM | POA: Diagnosis not present

## 2017-06-11 DIAGNOSIS — H6693 Otitis media, unspecified, bilateral: Secondary | ICD-10-CM | POA: Diagnosis not present

## 2017-06-11 DIAGNOSIS — E669 Obesity, unspecified: Secondary | ICD-10-CM | POA: Diagnosis not present

## 2017-06-11 DIAGNOSIS — I10 Essential (primary) hypertension: Secondary | ICD-10-CM | POA: Diagnosis not present

## 2017-06-17 DIAGNOSIS — M79641 Pain in right hand: Secondary | ICD-10-CM | POA: Insufficient documentation

## 2017-06-17 DIAGNOSIS — M79642 Pain in left hand: Secondary | ICD-10-CM | POA: Diagnosis not present

## 2017-07-14 ENCOUNTER — Other Ambulatory Visit: Payer: Self-pay | Admitting: Internal Medicine

## 2017-07-14 ENCOUNTER — Ambulatory Visit
Admission: RE | Admit: 2017-07-14 | Discharge: 2017-07-14 | Disposition: A | Discharge status: Home / Self Care | Payer: BLUE CROSS/BLUE SHIELD | Source: Ambulatory Visit | Attending: Internal Medicine | Admitting: Internal Medicine

## 2017-07-14 DIAGNOSIS — E669 Obesity, unspecified: Secondary | ICD-10-CM | POA: Diagnosis not present

## 2017-07-14 DIAGNOSIS — I10 Essential (primary) hypertension: Secondary | ICD-10-CM | POA: Diagnosis not present

## 2017-07-14 DIAGNOSIS — Z0000 Encounter for general adult medical examination without abnormal findings: Secondary | ICD-10-CM | POA: Diagnosis not present

## 2017-07-14 DIAGNOSIS — R52 Pain, unspecified: Secondary | ICD-10-CM

## 2017-07-14 DIAGNOSIS — M79671 Pain in right foot: Secondary | ICD-10-CM | POA: Diagnosis not present

## 2017-07-16 DIAGNOSIS — M19071 Primary osteoarthritis, right ankle and foot: Secondary | ICD-10-CM | POA: Diagnosis not present

## 2017-07-16 DIAGNOSIS — M1040 Other secondary gout, unspecified site: Secondary | ICD-10-CM | POA: Diagnosis not present

## 2017-07-16 DIAGNOSIS — I10 Essential (primary) hypertension: Secondary | ICD-10-CM | POA: Diagnosis not present

## 2017-07-16 DIAGNOSIS — R42 Dizziness and giddiness: Secondary | ICD-10-CM | POA: Diagnosis not present

## 2017-08-13 DIAGNOSIS — S81851A Open bite, right lower leg, initial encounter: Secondary | ICD-10-CM | POA: Diagnosis not present

## 2017-11-24 DIAGNOSIS — N200 Calculus of kidney: Secondary | ICD-10-CM | POA: Diagnosis not present

## 2017-11-24 DIAGNOSIS — N201 Calculus of ureter: Secondary | ICD-10-CM | POA: Diagnosis not present

## 2017-11-24 DIAGNOSIS — N23 Unspecified renal colic: Secondary | ICD-10-CM | POA: Diagnosis not present

## 2017-11-30 DIAGNOSIS — N23 Unspecified renal colic: Secondary | ICD-10-CM | POA: Diagnosis not present

## 2017-11-30 DIAGNOSIS — N200 Calculus of kidney: Secondary | ICD-10-CM | POA: Diagnosis not present

## 2017-11-30 DIAGNOSIS — N201 Calculus of ureter: Secondary | ICD-10-CM | POA: Diagnosis not present

## 2017-12-01 ENCOUNTER — Other Ambulatory Visit: Payer: Self-pay | Admitting: Urology

## 2017-12-01 DIAGNOSIS — R109 Unspecified abdominal pain: Secondary | ICD-10-CM

## 2017-12-02 ENCOUNTER — Other Ambulatory Visit: Payer: Self-pay | Admitting: Urology

## 2017-12-02 DIAGNOSIS — N200 Calculus of kidney: Secondary | ICD-10-CM

## 2017-12-02 DIAGNOSIS — R109 Unspecified abdominal pain: Secondary | ICD-10-CM

## 2017-12-08 ENCOUNTER — Ambulatory Visit: Admission: RE | Admit: 2017-12-08 | Payer: BLUE CROSS/BLUE SHIELD | Source: Ambulatory Visit

## 2018-07-20 DIAGNOSIS — N201 Calculus of ureter: Secondary | ICD-10-CM | POA: Diagnosis not present

## 2018-07-20 DIAGNOSIS — N23 Unspecified renal colic: Secondary | ICD-10-CM | POA: Diagnosis not present

## 2018-07-28 ENCOUNTER — Encounter
Admission: RE | Disposition: A | Discharge status: Home / Self Care | Payer: Self-pay | Source: Home / Self Care | Attending: Urology

## 2018-07-28 ENCOUNTER — Encounter: Payer: Self-pay | Admitting: *Deleted

## 2018-07-28 ENCOUNTER — Ambulatory Visit
Admission: RE | Admit: 2018-07-28 | Discharge: 2018-07-28 | Disposition: A | Discharge status: Home / Self Care | Payer: BLUE CROSS/BLUE SHIELD | Attending: Urology | Admitting: Urology

## 2018-07-28 DIAGNOSIS — Z79899 Other long term (current) drug therapy: Secondary | ICD-10-CM | POA: Insufficient documentation

## 2018-07-28 DIAGNOSIS — N200 Calculus of kidney: Secondary | ICD-10-CM | POA: Diagnosis not present

## 2018-07-28 DIAGNOSIS — M069 Rheumatoid arthritis, unspecified: Secondary | ICD-10-CM | POA: Diagnosis not present

## 2018-07-28 DIAGNOSIS — I10 Essential (primary) hypertension: Secondary | ICD-10-CM | POA: Insufficient documentation

## 2018-07-28 DIAGNOSIS — N201 Calculus of ureter: Secondary | ICD-10-CM

## 2018-07-28 HISTORY — PX: EXTRACORPOREAL SHOCK WAVE LITHOTRIPSY: SHX1557

## 2018-07-28 SURGERY — LITHOTRIPSY, ESWL
Anesthesia: Moderate Sedation | Laterality: Left

## 2018-07-28 MED ORDER — MORPHINE SULFATE (PF) 2 MG/ML IV SOLN
10.0000 mg | Freq: Once | INTRAVENOUS | Status: DC
  Filled 2018-07-28: qty 5

## 2018-07-28 MED ORDER — DIPHENHYDRAMINE HCL 25 MG PO CAPS
25.0000 mg | ORAL_CAPSULE | Freq: Once | ORAL | Status: AC
  Administered 2018-07-28: 25 mg via ORAL

## 2018-07-28 MED ORDER — DEXTROSE-NACL 5-0.45 % IV SOLN
INTRAVENOUS | Status: DC
  Administered 2018-07-28: 13:00:00 via INTRAVENOUS

## 2018-07-28 MED ORDER — MORPHINE SULFATE (PF) 10 MG/ML IV SOLN
10.0000 mg | Freq: Once | INTRAVENOUS | Status: AC
  Administered 2018-07-28: 10 mg via INTRAMUSCULAR

## 2018-07-28 MED ORDER — NUCYNTA 50 MG PO TABS: 50.0000 mg | ORAL_TABLET | Freq: Four times a day (QID) | ORAL | 0 refills | Status: DC | PRN

## 2018-07-28 MED ORDER — MIDAZOLAM HCL 2 MG/2ML IJ SOLN
INTRAMUSCULAR | Status: AC
  Administered 2018-07-28: 1 mg via INTRAMUSCULAR
  Filled 2018-07-28: qty 2

## 2018-07-28 MED ORDER — PROMETHAZINE HCL 25 MG/ML IJ SOLN
25.0000 mg | Freq: Once | INTRAMUSCULAR | Status: AC
  Administered 2018-07-28: 25 mg via INTRAMUSCULAR

## 2018-07-28 MED ORDER — FUROSEMIDE 10 MG/ML IJ SOLN: 10.0000 mg | Freq: Once | INTRAMUSCULAR | Status: DC

## 2018-07-28 MED ORDER — ONDANSETRON 8 MG PO TBDP: 8.0000 mg | ORAL_TABLET | Freq: Four times a day (QID) | ORAL | 3 refills | Status: DC | PRN

## 2018-07-28 MED ORDER — TAMSULOSIN HCL 0.4 MG PO CAPS: 0.4000 mg | ORAL_CAPSULE | Freq: Every day | ORAL | 1 refills | Status: DC

## 2018-07-28 MED ORDER — FUROSEMIDE 40 MG PO TABS
40.0000 mg | ORAL_TABLET | Freq: Once | ORAL | Status: AC
  Administered 2018-07-28: 40 mg via ORAL
  Filled 2018-07-28: qty 1

## 2018-07-28 MED ORDER — MIDAZOLAM HCL 2 MG/2ML IJ SOLN
1.0000 mg | Freq: Once | INTRAMUSCULAR | Status: AC
  Administered 2018-07-28: 1 mg via INTRAMUSCULAR

## 2018-07-28 MED ORDER — LEVOFLOXACIN 500 MG PO TABS
500.0000 mg | ORAL_TABLET | Freq: Once | ORAL | Status: AC
  Administered 2018-07-28: 500 mg via ORAL

## 2018-07-28 MED ORDER — DIPHENHYDRAMINE HCL 25 MG PO CAPS
ORAL_CAPSULE | ORAL | Status: AC
  Administered 2018-07-28: 25 mg via ORAL
  Filled 2018-07-28: qty 1

## 2018-07-28 MED ORDER — MORPHINE SULFATE (PF) 10 MG/ML IV SOLN
INTRAVENOUS | Status: AC
  Administered 2018-07-28: 10 mg via INTRAMUSCULAR
  Filled 2018-07-28: qty 1

## 2018-07-28 MED ORDER — LEVOFLOXACIN 500 MG PO TABS
ORAL_TABLET | ORAL | Status: AC
  Filled 2018-07-28: qty 1

## 2018-07-28 MED ORDER — PROMETHAZINE HCL 25 MG/ML IJ SOLN
INTRAMUSCULAR | Status: AC
  Administered 2018-07-28: 25 mg via INTRAMUSCULAR
  Filled 2018-07-28: qty 1

## 2018-07-28 NOTE — Discharge Instructions (Signed)
Kidney Stones °Kidney stones (urolithiasis) are rock-like masses that form inside of the kidneys. Kidneys are organs that make pee (urine). A kidney stone can cause very bad pain and can block the flow of pee. The stone usually leaves your body (passes) through your pee. You may need to have a doctor take out the stone. °Follow these instructions at home: °Eating and drinking °· Drink enough fluid to keep your pee clear or pale yellow. This will help you pass the stone. °· If told by your doctor, change the foods you eat (your diet). This may include: °? Limiting how much salt (sodium) you eat. °? Eating more fruits and vegetables. °? Limiting how much meat, poultry, fish, and eggs you eat. °· Follow instructions from your doctor about eating or drinking restrictions. °General instructions °· Collect pee samples as told by your doctor. You may need to collect a pee sample: °? 24 hours after a stone comes out. °? 8-12 weeks after a stone comes out, and every 6-12 months after that. °· Strain your pee every time you pee (urinate), for as long as told. Use the strainer that your doctor recommends. °· Do not throw out the stone. Keep it so that it can be tested by your doctor. °· Take over-the-counter and prescription medicines only as told by your doctor. °· Keep all follow-up visits as told by your doctor. This is important. You may need follow-up tests. °Preventing kidney stones °To prevent another kidney stone: °· Drink enough fluid to keep your pee clear or pale yellow. This is the best way to prevent kidney stones. °· Eat healthy foods. °· Avoid certain foods as told by your doctor. You may be told to eat less protein. °· Stay at a healthy weight. ° °Contact a doctor if: °· You have pain that gets worse or does not get better with medicine. °Get help right away if: °· You have a fever or chills. °· You get very bad pain. °· You get new pain in your belly (abdomen). °· You pass out (faint). °· You cannot pee. °This  information is not intended to replace advice given to you by your health care provider. Make sure you discuss any questions you have with your health care provider. °Document Released: 01/20/2008 Document Revised: 04/21/2016 Document Reviewed: 04/21/2016 °[employer] Interactive Patient Education © 2017 [employer] ° ° °Lithotripsy °Lithotripsy is a treatment that can sometimes help eliminate kidney stones and the pain that they cause. A form of lithotripsy, also known as extracorporeal shock wave lithotripsy, is a nonsurgical procedure that crushes a kidney stone with shock waves. These shock waves pass through your body and focus on the kidney stone. They cause the kidney stone to break up while it is still in the urinary tract. This makes it easier for the smaller pieces of stone to pass in the urine. °Tell a health care provider about: °· Any allergies you have. °· All medicines you are taking, including vitamins, herbs, eye drops, creams, and over-the-counter medicines. °· Any blood disorders you have. °· Any surgeries you have had. °· Any medical conditions you have. °· Whether you are pregnant or may be pregnant. °· Any problems you or family members have had with anesthetic medicines. °What are the risks? °Generally, this is a safe procedure. However, problems may occur, including: °· Infection. °· Bleeding of the kidney. °· Bruising of the kidney or skin. °· Scarring of the kidney, which can lead to: °? Increased blood pressure. °? Poor   kidney function. °? Return (recurrence) of kidney stones. °· Damage to other structures or organs, such as the liver, colon, spleen, or pancreas. °· Blockage (obstruction) of the the tube that carries urine from the kidney to the bladder (ureter). °· Failure of the kidney stone to break into pieces (fragments). ° °What happens before the procedure? °Staying hydrated °Follow instructions from your health care provider about hydration, which may include: °· Up to 2 hours  before the procedure - you may continue to drink clear liquids, such as water, clear fruit juice, black coffee, and plain tea. ° °Eating and drinking restrictions °Follow instructions from your health care provider about eating and drinking, which may include: °· 8 hours before the procedure - stop eating heavy meals or foods such as meat, fried foods, or fatty foods. °· 6 hours before the procedure - stop eating light meals or foods, such as toast or cereal. °· 6 hours before the procedure - stop drinking milk or drinks that contain milk. °· 2 hours before the procedure - stop drinking clear liquids. ° °General instructions °· Plan to have someone take you home from the hospital or clinic. °· Ask your health care provider about: °? Changing or stopping your regular medicines. This is especially important if you are taking diabetes medicines or blood thinners. °? Taking medicines such as aspirin and ibuprofen. These medicines and other NSAIDs can thin your blood. Do not take these medicines for 7 days before your procedure if your health care provider instructs you not to. °· You may have tests, such as: °? Blood tests. °? Urine tests. °? Imaging tests, such as a CT scan. °What happens during the procedure? °· To lower your risk of infection: °? Your health care team will wash or sanitize their hands. °? Your skin will be washed with soap. °· An IV tube will be inserted into one of your veins. This tube will give you fluids and medicines. °· You will be given one or more of the following: °? A medicine to help you relax (sedative). °? A medicine to make you fall asleep (general anesthetic). °· A water-filled cushion may be placed behind your kidney or on your abdomen. In some cases you may be placed in a tub of lukewarm water. °· Your body will be positioned in a way that makes it easy to target the kidney stone. °· A flexible tube with holes in it (stent) may be placed in the ureter. This will help keep urine  flowing from the kidney if the fragments of the stone have been blocking the ureter. °· An X-ray or ultrasound exam will be done to locate your stone. °· Shock waves will be aimed at the stone. If you are awake, you may feel a tapping sensation as the shock waves pass through your body. °The procedure may vary among health care providers and hospitals. °What happens after the procedure? °· You may have an X-ray to see whether the procedure was able to break up the kidney stone and how much of the stone has passed. If large stone fragments remain after treatment, you may need to have a second procedure at a later time. °· Your blood pressure, heart rate, breathing rate, and blood oxygen level will be monitored until the medicines you were given have worn off. °· You may be given antibiotics or pain medicine as needed. °· If a stent was placed in your ureter during surgery, it may stay in place for a few   weeks. °· You may need strain your urine to collect pieces of the kidney stone for testing. °· You will need to drink plenty of water. °· Do not drive for 24 hours if you were given a sedative. °Summary °· Lithotripsy is a treatment that can sometimes help eliminate kidney stones and the pain that they cause. °· A form of lithotripsy, also known as extracorporeal shock wave lithotripsy, is a nonsurgical procedure that crushes a kidney stone with shock waves. °· Generally, this is a safe procedure. However, problems may occur, including damage to the kidney or other organs, infection, or obstruction of the tube that carries urine from the kidney to the bladder (ureter). °· When you go home, you will need to drink plenty of water. You may be asked to strain your urine to collect pieces of the kidney stone for testing. °This information is not intended to replace advice given to you by your health care provider. Make sure you discuss any questions you have with your health care provider. °Document Released: 07/31/2000  Document Revised: 06/24/2016 Document Reviewed: 06/24/2016 °[employer] Interactive Patient Education © 2018 [employer] ° ° °Lithotripsy, Care After °This sheet gives you information about how to care for yourself after your procedure. Your health care provider may also give you more specific instructions. If you have problems or questions, contact your health care provider. °What can I expect after the procedure? °After the procedure, it is common to have: °· Some blood in your urine. This should only last for a few days. °· Soreness in your back, sides, or upper abdomen for a few days. °· Blotches or bruises on your back where the pressure wave entered the skin. °· Pain, discomfort, or nausea when pieces (fragments) of the kidney stone move through the tube that carries urine from the kidney to the bladder (ureter). Stone fragments may pass soon after the procedure, but they may continue to pass for up to 4-8 weeks. °? If you have severe pain or nausea, contact your health care provider. This may be caused by a large stone that was not broken up, and this may mean that you need more treatment. °· Some pain or discomfort during urination. °· Some pain or discomfort in the lower abdomen or (in men) at the base of the penis. ° °Follow these instructions at home: °Medicines °· Take over-the-counter and prescription medicines only as told by your health care provider. °· If you were prescribed an antibiotic medicine, take it as told by your health care provider. Do not stop taking the antibiotic even if you start to feel better. °· Do not drive for 24 hours if you were given a medicine to help you relax (sedative). °· Do not drive or use heavy machinery while taking prescription pain medicine. °Eating and drinking °· Drink enough water and fluids to keep your urine clear or pale yellow. This helps any remaining pieces of the stone to pass. It can also help prevent new stones from forming. °· Eat plenty of fresh fruits  and vegetables. °· Follow instructions from your health care provider about eating and drinking restrictions. You may be instructed: °? To reduce how much salt (sodium) you eat or drink. Check ingredients and nutrition facts on packaged foods and beverages. °? To reduce how much meat you eat. °· Eat the recommended amount of calcium for your age and gender. Ask your health care provider how much calcium you should have. °General instructions °· Get plenty of rest. °·   Most people can resume normal activities 1-2 days after the procedure. Ask your health care provider what activities are safe for you. °· If directed, strain all urine through the strainer that was provided by your health care provider. °? Keep all fragments for your health care provider to see. Any stones that are found may be sent to a medical lab for examination. The stone may be as small as a grain of salt. °· Keep all follow-up visits as told by your health care provider. This is important. °Contact a health care provider if: °· You have pain that is severe or does not get better with medicine. °· You have nausea that is severe or does not go away. °· You have blood in your urine longer than your health care provider told you to expect. °· You have more blood in your urine. °· You have pain during urination that does not go away. °· You urinate more frequently than usual and this does not go away. °· You develop a rash or any other possible signs of an allergic reaction. °Get help right away if: °· You have severe pain in your back, sides, or upper abdomen. °· You have severe pain while urinating. °· Your urine is very dark red. °· You have blood in your stool (feces). °· You cannot pass any urine at all. °· You feel a strong urge to urinate after emptying your bladder. °· You have a fever or chills. °· You develop shortness of breath, difficulty breathing, or chest pain. °· You have severe nausea that leads to persistent vomiting. °· You  faint. °Summary °· After this procedure, it is common to have some pain, discomfort, or nausea when pieces (fragments) of the kidney stone move through the tube that carries urine from the kidney to the bladder (ureter). If this pain or nausea is severe, however, you should contact your health care provider. °· Most people can resume normal activities 1-2 days after the procedure. Ask your health care provider what activities are safe for you. °· Drink enough water and fluids to keep your urine clear or pale yellow. This helps any remaining pieces of the stone to pass, and it can help prevent new stones from forming. °· If directed, strain your urine and keep all fragments for your health care provider to see. Fragments or stones may be as small as a grain of salt. °· Get help right away if you have severe pain in your back, sides, or upper abdomen or have severe pain while urinating. °This information is not intended to replace advice given to you by your health care provider. Make sure you discuss any questions you have with your health care provider. °Document Released: 08/23/2007 Document Revised: 06/24/2016 Document Reviewed: 06/24/2016 °[employer] Interactive Patient Education © 2018 [employer] ° °

## 2018-07-29 ENCOUNTER — Encounter: Payer: Self-pay | Admitting: Urology

## 2018-08-11 DIAGNOSIS — N200 Calculus of kidney: Secondary | ICD-10-CM | POA: Diagnosis not present

## 2018-10-25 DIAGNOSIS — R509 Fever, unspecified: Secondary | ICD-10-CM | POA: Diagnosis not present

## 2018-10-25 DIAGNOSIS — J111 Influenza due to unidentified influenza virus with other respiratory manifestations: Secondary | ICD-10-CM | POA: Diagnosis not present

## 2018-10-29 DIAGNOSIS — Z6841 Body Mass Index (BMI) 40.0 and over, adult: Secondary | ICD-10-CM | POA: Diagnosis not present

## 2018-10-29 DIAGNOSIS — J111 Influenza due to unidentified influenza virus with other respiratory manifestations: Secondary | ICD-10-CM | POA: Diagnosis not present

## 2019-03-07 DIAGNOSIS — S93514A Sprain of interphalangeal joint of right lesser toe(s), initial encounter: Secondary | ICD-10-CM | POA: Diagnosis not present

## 2019-05-30 DIAGNOSIS — Z20828 Contact with and (suspected) exposure to other viral communicable diseases: Secondary | ICD-10-CM | POA: Diagnosis not present

## 2019-09-08 DIAGNOSIS — Z20828 Contact with and (suspected) exposure to other viral communicable diseases: Secondary | ICD-10-CM | POA: Diagnosis not present

## 2019-10-24 DIAGNOSIS — Z79899 Other long term (current) drug therapy: Secondary | ICD-10-CM | POA: Diagnosis not present

## 2019-10-24 DIAGNOSIS — M79641 Pain in right hand: Secondary | ICD-10-CM | POA: Diagnosis not present

## 2019-10-26 DIAGNOSIS — M19049 Primary osteoarthritis, unspecified hand: Secondary | ICD-10-CM | POA: Diagnosis not present

## 2019-10-26 DIAGNOSIS — M79641 Pain in right hand: Secondary | ICD-10-CM | POA: Diagnosis not present

## 2019-10-26 DIAGNOSIS — M19041 Primary osteoarthritis, right hand: Secondary | ICD-10-CM | POA: Diagnosis not present

## 2019-10-26 DIAGNOSIS — M19042 Primary osteoarthritis, left hand: Secondary | ICD-10-CM | POA: Diagnosis not present

## 2019-11-13 DIAGNOSIS — Z79899 Other long term (current) drug therapy: Secondary | ICD-10-CM | POA: Diagnosis not present

## 2019-12-04 DIAGNOSIS — Z79899 Other long term (current) drug therapy: Secondary | ICD-10-CM | POA: Diagnosis not present

## 2019-12-04 DIAGNOSIS — M069 Rheumatoid arthritis, unspecified: Secondary | ICD-10-CM | POA: Diagnosis not present

## 2020-01-02 DIAGNOSIS — N138 Other obstructive and reflux uropathy: Secondary | ICD-10-CM | POA: Diagnosis not present

## 2020-01-02 DIAGNOSIS — R21 Rash and other nonspecific skin eruption: Secondary | ICD-10-CM | POA: Diagnosis not present

## 2020-01-02 DIAGNOSIS — N401 Enlarged prostate with lower urinary tract symptoms: Secondary | ICD-10-CM | POA: Diagnosis not present

## 2020-01-02 DIAGNOSIS — I10 Essential (primary) hypertension: Secondary | ICD-10-CM | POA: Diagnosis not present

## 2020-01-02 DIAGNOSIS — R300 Dysuria: Secondary | ICD-10-CM | POA: Diagnosis not present

## 2020-01-02 DIAGNOSIS — N179 Acute kidney failure, unspecified: Secondary | ICD-10-CM | POA: Diagnosis not present

## 2020-01-02 DIAGNOSIS — N200 Calculus of kidney: Secondary | ICD-10-CM | POA: Diagnosis not present

## 2020-01-02 DIAGNOSIS — M549 Dorsalgia, unspecified: Secondary | ICD-10-CM | POA: Diagnosis not present

## 2020-01-02 DIAGNOSIS — R109 Unspecified abdominal pain: Secondary | ICD-10-CM | POA: Diagnosis not present

## 2020-01-03 DIAGNOSIS — N201 Calculus of ureter: Secondary | ICD-10-CM | POA: Diagnosis not present

## 2020-01-03 DIAGNOSIS — N132 Hydronephrosis with renal and ureteral calculous obstruction: Secondary | ICD-10-CM | POA: Diagnosis not present

## 2020-01-03 DIAGNOSIS — R110 Nausea: Secondary | ICD-10-CM | POA: Diagnosis not present

## 2020-01-03 DIAGNOSIS — N23 Unspecified renal colic: Secondary | ICD-10-CM | POA: Diagnosis not present

## 2020-01-04 ENCOUNTER — Other Ambulatory Visit: Payer: Self-pay

## 2020-01-04 ENCOUNTER — Encounter
Admission: RE | Disposition: A | Discharge status: Home / Self Care | Payer: Self-pay | Source: Home / Self Care | Attending: Urology

## 2020-01-04 ENCOUNTER — Ambulatory Visit
Admission: RE | Admit: 2020-01-04 | Discharge: 2020-01-04 | Disposition: A | Discharge status: Home / Self Care | Payer: BC Managed Care – PPO | Attending: Urology | Admitting: Urology

## 2020-01-04 ENCOUNTER — Encounter: Payer: Self-pay | Admitting: Urology

## 2020-01-04 DIAGNOSIS — Z79899 Other long term (current) drug therapy: Secondary | ICD-10-CM | POA: Insufficient documentation

## 2020-01-04 DIAGNOSIS — N200 Calculus of kidney: Secondary | ICD-10-CM | POA: Diagnosis not present

## 2020-01-04 DIAGNOSIS — Z885 Allergy status to narcotic agent status: Secondary | ICD-10-CM | POA: Diagnosis not present

## 2020-01-04 DIAGNOSIS — Z6841 Body Mass Index (BMI) 40.0 and over, adult: Secondary | ICD-10-CM | POA: Diagnosis not present

## 2020-01-04 DIAGNOSIS — N132 Hydronephrosis with renal and ureteral calculous obstruction: Secondary | ICD-10-CM | POA: Diagnosis not present

## 2020-01-04 DIAGNOSIS — N201 Calculus of ureter: Secondary | ICD-10-CM

## 2020-01-04 DIAGNOSIS — E669 Obesity, unspecified: Secondary | ICD-10-CM | POA: Insufficient documentation

## 2020-01-04 DIAGNOSIS — I10 Essential (primary) hypertension: Secondary | ICD-10-CM | POA: Insufficient documentation

## 2020-01-04 DIAGNOSIS — M069 Rheumatoid arthritis, unspecified: Secondary | ICD-10-CM | POA: Diagnosis not present

## 2020-01-04 HISTORY — PX: EXTRACORPOREAL SHOCK WAVE LITHOTRIPSY: SHX1557

## 2020-01-04 SURGERY — LITHOTRIPSY, ESWL
Anesthesia: Moderate Sedation | Laterality: Right

## 2020-01-04 MED ORDER — FUROSEMIDE 10 MG/ML IJ SOLN
INTRAMUSCULAR | Status: AC
  Filled 2020-01-04: qty 2

## 2020-01-04 MED ORDER — PROMETHAZINE HCL 25 MG/ML IJ SOLN: 25.0000 mg | Freq: Once | INTRAMUSCULAR | Status: AC

## 2020-01-04 MED ORDER — PROMETHAZINE HCL 25 MG/ML IJ SOLN
INTRAMUSCULAR | Status: AC
  Administered 2020-01-04: 25 mg via INTRAMUSCULAR
  Filled 2020-01-04: qty 1

## 2020-01-04 MED ORDER — MORPHINE SULFATE (PF) 10 MG/ML IV SOLN
INTRAVENOUS | Status: AC
  Administered 2020-01-04: 10 mg via INTRAMUSCULAR
  Filled 2020-01-04: qty 1

## 2020-01-04 MED ORDER — DEXTROSE-NACL 5-0.45 % IV SOLN: INTRAVENOUS | Status: DC

## 2020-01-04 MED ORDER — DIPHENHYDRAMINE HCL 25 MG PO CAPS
ORAL_CAPSULE | ORAL | Status: AC
  Administered 2020-01-04: 25 mg via ORAL
  Filled 2020-01-04: qty 1

## 2020-01-04 MED ORDER — MIDAZOLAM HCL 2 MG/2ML IJ SOLN: 1.0000 mg | Freq: Once | INTRAMUSCULAR | Status: AC

## 2020-01-04 MED ORDER — DIPHENHYDRAMINE HCL 25 MG PO CAPS: 25.0000 mg | ORAL_CAPSULE | Freq: Once | ORAL | Status: AC

## 2020-01-04 MED ORDER — MIDAZOLAM HCL 2 MG/2ML IJ SOLN
INTRAMUSCULAR | Status: AC
  Administered 2020-01-04: 1 mg via INTRAMUSCULAR
  Filled 2020-01-04: qty 2

## 2020-01-04 MED ORDER — SODIUM CHLORIDE 0.9 % IV SOLN
1.0000 g | Freq: Once | INTRAVENOUS | Status: AC
  Administered 2020-01-04: 1 g via INTRAVENOUS
  Filled 2020-01-04: qty 10

## 2020-01-04 MED ORDER — FUROSEMIDE 10 MG/ML IJ SOLN
10.0000 mg | Freq: Once | INTRAMUSCULAR | Status: AC
  Administered 2020-01-04: 10 mg via INTRAVENOUS

## 2020-01-04 MED ORDER — MORPHINE SULFATE (PF) 10 MG/ML IV SOLN: 10.0000 mg | Freq: Once | INTRAVENOUS | Status: AC

## 2020-01-04 NOTE — Discharge Instructions (Addendum)
Lithotripsy  Lithotripsy is a treatment that can sometimes help eliminate kidney stones and the pain that they cause. A form of lithotripsy, also known as extracorporeal shock wave lithotripsy, is a nonsurgical procedure that crushes a kidney stone with shock waves. These shock waves pass through your body and focus on the kidney stone. They cause the kidney stone to break up while it is still in the urinary tract. This makes it easier for the smaller pieces of stone to pass in the urine. Tell a health care provider about:  Any allergies you have.  All medicines you are taking, including vitamins, herbs, eye drops, creams, and over-the-counter medicines.  Any blood disorders you have.  Any surgeries you have had.  Any medical conditions you have.  Whether you are pregnant or may be pregnant.  Any problems you or family members have had with anesthetic medicines. What are the risks? Generally, this is a safe procedure. However, problems may occur, including:  Infection.  Bleeding of the kidney.  Bruising of the kidney or skin.  Scarring of the kidney, which can lead to: ? Increased blood pressure. ? Poor kidney function. ? Return (recurrence) of kidney stones.  Damage to other structures or organs, such as the liver, colon, spleen, or pancreas.  Blockage (obstruction) of the the tube that carries urine from the kidney to the bladder (ureter).  Failure of the kidney stone to break into pieces (fragments). What happens before the procedure? Staying hydrated Follow instructions from your health care provider about hydration, which may include:  Up to 2 hours before the procedure - you may continue to drink clear liquids, such as water, clear fruit juice, black coffee, and plain tea. Eating and drinking restrictions Follow instructions from your health care provider about eating and drinking, which may include:  8 hours before the procedure - stop eating heavy meals or foods  such as meat, fried foods, or fatty foods.  6 hours before the procedure - stop eating light meals or foods, such as toast or cereal.  6 hours before the procedure - stop drinking milk or drinks that contain milk.  2 hours before the procedure - stop drinking clear liquids. General instructions  Plan to have someone take you home from the hospital or clinic.  Ask your health care provider about: ? Changing or stopping your regular medicines. This is especially important if you are taking diabetes medicines or blood thinners. ? Taking medicines such as aspirin and ibuprofen. These medicines and other NSAIDs can thin your blood. Do not take these medicines for 7 days before your procedure if your health care provider instructs you not to.  You may have tests, such as: ? Blood tests. ? Urine tests. ? Imaging tests, such as a CT scan. What happens during the procedure?  To lower your risk of infection: ? Your health care team will wash or sanitize their hands. ? Your skin will be washed with soap.  An IV tube will be inserted into one of your veins. This tube will give you fluids and medicines.  You will be given one or more of the following: ? A medicine to help you relax (sedative). ? A medicine to make you fall asleep (general anesthetic).  A water-filled cushion may be placed behind your kidney or on your abdomen. In some cases you may be placed in a tub of lukewarm water.  Your body will be positioned in a way that makes it easy to target the kidney   stone.  A flexible tube with holes in it (stent) may be placed in the ureter. This will help keep urine flowing from the kidney if the fragments of the stone have been blocking the ureter.  An X-ray or ultrasound exam will be done to locate your stone.  Shock waves will be aimed at the stone. If you are awake, you may feel a tapping sensation as the shock waves pass through your body. The procedure may vary among health care  providers and hospitals. What happens after the procedure?  You may have an X-ray to see whether the procedure was able to break up the kidney stone and how much of the stone has passed. If large stone fragments remain after treatment, you may need to have a second procedure at a later time.  Your blood pressure, heart rate, breathing rate, and blood oxygen level will be monitored until the medicines you were given have worn off.  You may be given antibiotics or pain medicine as needed.  If a stent was placed in your ureter during surgery, it may stay in place for a few weeks.  You may need strain your urine to collect pieces of the kidney stone for testing.  You will need to drink plenty of water.  Do not drive for 24 hours if you were given a sedative. Summary  Lithotripsy is a treatment that can sometimes help eliminate kidney stones and the pain that they cause.  A form of lithotripsy, also known as extracorporeal shock wave lithotripsy, is a nonsurgical procedure that crushes a kidney stone with shock waves.  Generally, this is a safe procedure. However, problems may occur, including damage to the kidney or other organs, infection, or obstruction of the tube that carries urine from the kidney to the bladder (ureter).  When you go home, you will need to drink plenty of water. You may be asked to strain your urine to collect pieces of the kidney stone for testing. This information is not intended to replace advice given to you by your health care provider. Make sure you discuss any questions you have with your health care provider. Document Revised: 11/14/2018 Document Reviewed: 06/24/2016 Elsevier Patient Education  2020 [place] After This sheet gives you information about how to care for yourself after your procedure. Your health care provider may also give you more specific instructions. If you have problems or questions, contact your health care  provider. What can I expect after the procedure? After the procedure, it is common to have:  Some blood in your urine. This should only last for a few days.  Soreness in your back, sides, or upper abdomen for a few days.  Blotches or bruises on your back where the pressure wave entered the skin.  Pain, discomfort, or nausea when pieces (fragments) of the kidney stone move through the tube that carries urine from the kidney to the bladder (ureter). Stone fragments may pass soon after the procedure, but they may continue to pass for up to 4-8 weeks. ? If you have severe pain or nausea, contact your health care provider. This may be caused by a large stone that was not broken up, and this may mean that you need more treatment.  Some pain or discomfort during urination.  Some pain or discomfort in the lower abdomen or (in men) at the base of the penis. Follow these instructions at home: Medicines  Take over-the-counter and prescription medicines only as told by your health  care provider.  If you were prescribed an antibiotic medicine, take it as told by your health care provider. Do not stop taking the antibiotic even if you start to feel better.  Do not drive for 24 hours if you were given a medicine to help you relax (sedative).  Do not drive or use heavy machinery while taking prescription pain medicine. Eating and drinking      Drink enough water and fluids to keep your urine clear or pale yellow. This helps any remaining pieces of the stone to pass. It can also help prevent new stones from forming.  Eat plenty of fresh fruits and vegetables.  Follow instructions from your health care provider about eating and drinking restrictions. You may be instructed: ? To reduce how much salt (sodium) you eat or drink. Check ingredients and nutrition facts on packaged foods and beverages. ? To reduce how much meat you eat.  Eat the recommended amount of calcium for your age and gender. Ask  your health care provider how much calcium you should have. General instructions  Get plenty of rest.  Most people can resume normal activities 1-2 days after the procedure. Ask your health care provider what activities are safe for you.  Your health care provider may direct you to lie in a certain position (postural drainage) and tap firmly (percuss) over your kidney area to help stone fragments pass. Follow instructions as told by your health care provider.  If directed, strain all urine through the strainer that was provided by your health care provider. ? Keep all fragments for your health care provider to see. Any stones that are found may be sent to a medical lab for examination. The stone may be as small as a grain of salt.  Keep all follow-up visits as told by your health care provider. This is important. Contact a health care provider if:  You have pain that is severe or does not get better with medicine.  You have nausea that is severe or does not go away.  You have blood in your urine longer than your health care provider told you to expect.  You have more blood in your urine.  You have pain during urination that does not go away.  You urinate more frequently than usual and this does not go away.  You develop a rash or any other possible signs of an allergic reaction. Get help right away if:  You have severe pain in your back, sides, or upper abdomen.  You have severe pain while urinating.  Your urine is very dark red.  You have blood in your stool (feces).  You cannot pass any urine at all.  You feel a strong urge to urinate after emptying your bladder.  You have a fever or chills.  You develop shortness of breath, difficulty breathing, or chest pain.  You have severe nausea that leads to persistent vomiting.  You faint. Summary  After this procedure, it is common to have some pain, discomfort, or nausea when pieces (fragments) of the kidney stone move  through the tube that carries urine from the kidney to the bladder (ureter). If this pain or nausea is severe, however, you should contact your health care provider.  Most people can resume normal activities 1-2 days after the procedure. Ask your health care provider what activities are safe for you.  Drink enough water and fluids to keep your urine clear or pale yellow. This helps any remaining pieces of the stone to  pass, and it can help prevent new stones from forming.  If directed, strain your urine and keep all fragments for your health care provider to see. Fragments or stones may be as small as a grain of salt.  Get help right away if you have severe pain in your back, sides, or upper abdomen or have severe pain while urinating. This information is not intended to replace advice given to you by your health care provider. Make sure you discuss any questions you have with your health care provider. Document Revised: 11/14/2018 Document Reviewed: 06/24/2016 Elsevier Patient Education  2020 Else[employer] Inc[employer]   AMBULATORY SURGERY  DISCHARGE INSTRUCTIONS   1) The drugs that you were given will stay in your system until tomorrow so for the next 24 hours you should not:  A) Drive an automobile B) Make any legal decisions C) Drink any alcoholic beverage   2) You may resume regular meals tomorrow.  Today it is better to start with liquids and gradually work up to solid foods.  You may eat anything you prefer, but it is better to start with liquids, then soup and crackers, and gradually work up to solid foods.   3) Please notify your doctor immediately if you have any unusual bleeding, trouble breathing, redness and pain at the surgery site, drainage, fever, or pain not relieved by medication.    4) Additional Instructions:        Please contact your physician with any problems or Same Day Surgery at [phone], Monday through Friday 6 am to 4 pm, or [place] [facility]  number at [phone].

## 2020-03-26 DIAGNOSIS — Z6841 Body Mass Index (BMI) 40.0 and over, adult: Secondary | ICD-10-CM | POA: Diagnosis not present

## 2020-03-26 DIAGNOSIS — Z79899 Other long term (current) drug therapy: Secondary | ICD-10-CM | POA: Diagnosis not present

## 2020-03-26 DIAGNOSIS — M79641 Pain in right hand: Secondary | ICD-10-CM | POA: Diagnosis not present

## 2020-03-27 DIAGNOSIS — N200 Calculus of kidney: Secondary | ICD-10-CM | POA: Diagnosis not present

## 2020-04-19 ENCOUNTER — Other Ambulatory Visit: Payer: Self-pay

## 2020-04-19 ENCOUNTER — Encounter: Payer: Self-pay | Admitting: Internal Medicine

## 2020-04-19 ENCOUNTER — Ambulatory Visit: Payer: BC Managed Care – PPO | Admitting: Internal Medicine

## 2020-04-19 VITALS — BP 161/94 | HR 84 | Ht 74.0 in | Wt 379.2 lb

## 2020-04-19 DIAGNOSIS — I10 Essential (primary) hypertension: Secondary | ICD-10-CM | POA: Diagnosis not present

## 2020-04-19 DIAGNOSIS — Z87442 Personal history of urinary calculi: Secondary | ICD-10-CM

## 2020-04-19 DIAGNOSIS — Z6841 Body Mass Index (BMI) 40.0 and over, adult: Secondary | ICD-10-CM

## 2020-04-19 DIAGNOSIS — M05741 Rheumatoid arthritis with rheumatoid factor of right hand without organ or systems involvement: Secondary | ICD-10-CM | POA: Diagnosis not present

## 2020-04-19 DIAGNOSIS — M05742 Rheumatoid arthritis with rheumatoid factor of left hand without organ or systems involvement: Secondary | ICD-10-CM

## 2020-04-19 MED ORDER — LISINOPRIL 20 MG PO TABS: 20.0000 mg | ORAL_TABLET | Freq: Every day | ORAL | 3 refills | Status: DC

## 2020-04-19 MED ORDER — ENALAPRIL MALEATE 5 MG PO TABS: 5.0000 mg | ORAL_TABLET | Freq: Every day | ORAL | 6 refills | Status: DC

## 2020-04-19 NOTE — Assessment & Plan Note (Signed)
-   Today, the patient's blood pressure is not well managed on enalapril  Will increase dosage. - The patient will change the current treatment regimen.  - I encouraged the patient to eat a low-sodium diet to help control blood pressure. - I encouraged the patient to live an active lifestyle and complete activities that increases heart rate to 85% target heart rate at least 5 times per week for one hour.

## 2020-04-19 NOTE — Assessment & Plan Note (Signed)
No recent attack

## 2020-04-19 NOTE — Progress Notes (Signed)
Established Patient Office Visit  SUBJECTIVE:  Subjective  Patient ID: Hector Hancock, male    DOB: Sep 22, 1965  Age: 54 y.o. MRN: [id number]  CC:  Chief Complaint  Patient presents with  . Hypertension    patient's bp elevated today, he states that he has been out of meds x 3 days     HPI Hector Hancock is a 54 y.o. male presenting today for a medication refill for his HTN refill.   He is not vaccinated against COVID19. He does not have any intention to get the COVID19 vaccine at this time because he is worried about possible health side effects.   He does have some complaints about arthritis, mostly in his hands.    Past Medical History:  Diagnosis Date  . Arthritis   . History of kidney stones   . Hypertension     Past Surgical History:  Procedure Laterality Date  . COLONOSCOPY WITH PROPOFOL N/A 12/27/2015   Procedure: COLONOSCOPY WITH PROPOFOL;  Surgeon: Midge Minium, MD;  Location: [facility];  Service: Endoscopy;  Laterality: N/A;  . EXTRACORPOREAL SHOCK WAVE LITHOTRIPSY Left 07/28/2018   Procedure: EXTRACORPOREAL SHOCK WAVE LITHOTRIPSY (ESWL);  Surgeon: Orson Ape, MD;  Location: [facility];  Service: Urology;  Laterality: Left;  . EXTRACORPOREAL SHOCK WAVE LITHOTRIPSY Right 01/04/2020   Procedure: EXTRACORPOREAL SHOCK WAVE LITHOTRIPSY (ESWL);  Surgeon: Orson Ape, MD;  Location: [facility];  Service: Urology;  Laterality: Right;  . POLYPECTOMY  12/27/2015   Procedure: POLYPECTOMY;  Surgeon: Midge Minium, MD;  Location: [facility];  Service: Endoscopy;;  . SACROILIAC JOINT FUSION Right 09/06/2013   Procedure: SACROILIAC JOINT FUSION;  Surgeon: Emilee Hero, MD;  Location: [facility];  Service: Orthopedics;  Laterality: Right;  Right sided sacroiliac joint fusion  . TOTAL SHOULDER ARTHROPLASTY Left     Family History  Problem Relation Age of Onset  . Diabetes Mother   . Diabetes Sister   . Diabetes Sister     Social History    Socioeconomic History  . Marital status: Married    Spouse name: Not on file  . Number of children: Not on file  . Years of education: Not on file  . Highest education level: Not on file  Occupational History  . Not on file  Tobacco Use  . Smoking status: Never Smoker  . Smokeless tobacco: Never Used  Substance and Sexual Activity  . Alcohol use: No  . Drug use: No  . Sexual activity: Not on file  Other Topics Concern  . Not on file  Social History Narrative  . Not on file   Social Determinants of Health   Financial Resource Strain:   . Difficulty of Paying Living Expenses: Not on file  Food Insecurity:   . Worried About Programme researcher, broadcasting/film/video in the Last Year: Not on file  . Ran Out of Food in the Last Year: Not on file  Transportation Needs:   . Lack of Transportation (Medical): Not on file  . Lack of Transportation (Non-Medical): Not on file  Physical Activity:   . Days of Exercise per Week: Not on file  . Minutes of Exercise per Session: Not on file  Stress:   . Feeling of Stress : Not on file  Social Connections:   . Frequency of Communication with Friends and Family: Not on file  . Frequency of Social Gatherings with Friends and Family: Not on file  . Attends Religious Services: Not on  file  . Active Member of Clubs or Organizations: Not on file  . Attends Banker Meetings: Not on file  . Marital Status: Not on file  Intimate Partner Violence:   . Fear of Current or Ex-Partner: Not on file  . Emotionally Abused: Not on file  . Physically Abused: Not on file  . Sexually Abused: Not on file     Current Outpatient Medications:  .  enalapril (VASOTEC) 5 MG tablet, Take 1 tablet (5 mg total) by mouth daily., Disp: 30 tablet, Rfl: 6 .  hydroxychloroquine (PLAQUENIL) 200 MG tablet, Take 200 mg by mouth daily., Disp: , Rfl:  .  NUCYNTA 50 MG tablet, Take 1 tablet (50 mg total) by mouth every 6 (six) hours as needed for moderate pain. 1 TO 2 TABS Q 6  HOURS PRN PAIN, Disp: 20 tablet, Rfl: 0 .  ondansetron (ZOFRAN ODT) 8 MG disintegrating tablet, Take 1 tablet (8 mg total) by mouth every 6 (six) hours as needed for nausea or vomiting., Disp: 4 tablet, Rfl: 3 .  tamsulosin (FLOMAX) 0.4 MG CAPS capsule, Take 1 capsule (0.4 mg total) by mouth daily., Disp: 30 capsule, Rfl: 1 .  lisinopril (ZESTRIL) 20 MG tablet, Take 1 tablet (20 mg total) by mouth daily., Disp: 90 tablet, Rfl: 3   Allergies  Allergen Reactions  . Codeine Itching    ROS Review of Systems  Constitutional: Negative.   HENT: Negative.   Eyes: Negative.   Respiratory: Negative.   Cardiovascular: Negative.   Gastrointestinal: Negative.   Endocrine: Negative.   Genitourinary: Negative.   Musculoskeletal: Positive for arthralgias (hands).  Skin: Negative.   Allergic/Immunologic: Negative.   Neurological: Negative.   Hematological: Negative.   Psychiatric/Behavioral: Negative.   All other systems reviewed and are negative.    OBJECTIVE:    Physical Exam Vitals reviewed.  Constitutional:      Appearance: Normal appearance. He is obese.  HENT:     Mouth/Throat:     Mouth: Mucous membranes are moist.  Eyes:     Pupils: Pupils are equal, round, and reactive to light.  Neck:     Vascular: No carotid bruit.  Cardiovascular:     Rate and Rhythm: Normal rate and regular rhythm.     Pulses: Normal pulses.     Heart sounds: Normal heart sounds.  Pulmonary:     Effort: Pulmonary effort is normal.     Breath sounds: Normal breath sounds.  Abdominal:     General: Bowel sounds are normal.     Palpations: Abdomen is soft. There is no hepatomegaly, splenomegaly or mass.     Tenderness: There is no abdominal tenderness.     Hernia: No hernia is present.  Musculoskeletal:     Cervical back: Neck supple.     Right lower leg: No edema.     Left lower leg: No edema.  Skin:    Findings: No rash.  Neurological:     Mental Status: He is alert and oriented to person,  place, and time.     Motor: No weakness.  Psychiatric:        Mood and Affect: Mood normal.        Behavior: Behavior normal.     BP (!) 161/94   Pulse 84   Ht 6\' 2"  (1.88 m)   Wt (!) 379 lb 3.2 oz (172 kg)   BMI 48.69 kg/m  Wt Readings from Last 3 Encounters:  04/19/20 (!) 379 lb 3.2 oz (  172 kg)  01/04/20 (!) 358 lb (162.4 kg)  07/28/18 (!) 364 lb (165.1 kg)    Health Maintenance Due  Topic Date Due  . Hepatitis C Screening  Never done  . COVID-19 Vaccine (1) Never done  . HIV Screening  Never done  . TETANUS/TDAP  Never done  . INFLUENZA VACCINE  03/17/2020    There are no preventive care reminders to display for this patient.  CBC Latest Ref Rng & Units 06/18/2014 09/05/2013 08/03/2013  WBC 3.8 - 10.6 x10 3/mm 3 9.6 6.8 9.4  Hemoglobin 13.0 - 18.0 g/dL 16.1 09.6 04.5  Hematocrit 40.0 - 52.0 % 47.2 46.1 46.2  Platelets 150 - 440 x10 3/mm 3 209 217 207   CMP Latest Ref Rng & Units 06/18/2014 09/05/2013 08/03/2013  Glucose 65 - 99 mg/dL 409(W) 119(J) 478(G)  BUN 7 - 18 mg/dL Creatinine 0.60 - 1.30 mg/dL 9.56 2.13 0.86  Sodium 136 - 145 mmol/L 141 142 137  Potassium 3.5 - 5.1 mmol/L 3.8 4.2 3.8  Chloride 98 - 107 mmol/L 105 104 105  CO2 21 - 32 mmol/L Calcium 8.5 - 10.1 mg/dL 8.1(L) 8.8 9.0  Total Protein 6.4 - 8.2 g/dL 7.5 7.2 7.6  Total Bilirubin 0.2 - 1.0 mg/dL 0.5 0.5 0.5  Alkaline Phos Unit/L 68 61 72  AST 15 - 37 Unit/L 39(H) 25 29  ALT U/L 48 32 37    No results found for: TSH Lab Results  Component Value Date   ALBUMIN 3.7 06/18/2014   ANIONGAP 9 06/18/2014   Lab Results  Component Value Date   CHOL 140 06/19/2014   CHOL 165 06/18/2014   CHOL 164 12/30/2011   HDL 29 (L) 06/19/2014   HDL 35 (L) 06/18/2014   HDL 46 12/30/2011   LDLCALC 83 06/19/2014   LDLCALC 91 06/18/2014   LDLCALC 102 (H) 12/30/2011   Lab Results  Component Value Date   TRIG 140 06/19/2014   Lab Results  Component Value Date   HGBA1C 5.6 12/30/2011       ASSESSMENT & PLAN:   Problem List Items Addressed This Visit      Cardiovascular and Mediastinum   Hypertension    - Today, the patient's blood pressure is not well managed on enalapril  Will increase dosage. - The patient will change the current treatment regimen.  - I encouraged the patient to eat a low-sodium diet to help control blood pressure. - I encouraged the patient to live an active lifestyle and complete activities that increases heart rate to 85% target heart rate at least 5 times per week for one hour.          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  - I educated them on making healthy dietary choices including eating more fruits and vegetables and less fried foods. - I encouraged the patient to exercise more, and educated on the benefits of exercise including weight loss, diabetes management, and hypertension management.  -  I encouraged the pt to consider weight loss surgery.  - I encouraged the pt to get the COVID shot.       History of nephrolithiasis    No recent attack         Meds ordered this encounter  Medications  . enalapril (VASOTEC) 5 MG tablet    Sig: Take 1 tablet (5 mg total) by mouth daily.    Dispense:  30 tablet    Refill:  6  . lisinopril (ZESTRIL) 20 MG tablet    Sig: Take 1 tablet (20 mg total) by mouth daily.    Dispense:  90 tablet    Refill:  3    Follow-up: No follow-ups on file.    Dr. Woodroe Chen [facility] [address] [place], [place] [address]   By signing my name below, I, [employer], attest that  this documentation has been prepared under the direction and in the presence of Corky Downs, MD. Electronically Signed: Corky Downs, MD 04/19/20, 9:59 AM   I personally performed the services described in this documentation, which was SCRIBED in my presence. The recorded information has been reviewed and considered accurate. It has been edited as necessary during review. Corky Downs, MD

## 2020-04-19 NOTE — Assessment & Plan Note (Signed)
Stable  On plaquenil

## 2020-04-19 NOTE — Patient Instructions (Addendum)
Understanding How COVID-19 Vaccines Work Updated Jan 11, 2020  What You Need to Know . COVID-19 vaccines are safe and effective. . You may have side effects after vaccination, but these are normal. . It typically takes two weeks after you are fully vaccinated for the body to build protection (immunity) against the virus that causes COVID-19. . If you are not vaccinated, find a vaccine. Keep taking all precautions until you are fully vaccinated. . If you are fully vaccinated, you can resume activities that you did prior to the pandemic. Learn more about what you can do when you have been fully vaccinated.  Vaccine sticker and vial The Immune System--the Body's Defense Against Infection To understand how COVID-19 vaccines work, it helps to first look at how our bodies fight illness. When germs, such as the virus that causes COVID-19, invade our bodies, they attack and multiply. This invasion, called an infection, is what causes illness. Our immune system uses several tools to fight infection. Blood contains red cells, which carry oxygen to tissues and organs, and white or immune cells, which fight infection. Different types of white blood cells fight infection in different ways:  Marland Kitchen Macrophages are white blood cells that swallow up and digest germs and dead or dying cells. The macrophages leave behind parts of the invading germs, called "antigens". The body identifies antigens as dangerous and stimulates antibodies to attack them. . B-lymphocytes are defensive white blood cells. They produce antibodies that attack the pieces of the virus left behind by the macrophages. . T-lymphocytes are another type of defensive white blood cell. They attack cells in the body that have already been infected. . The first time a person is infected with the virus that causes COVID-19, it can take several days or weeks for their body to make and use all the germ-fighting tools needed to get over the infection. After the  infection, the person's immune system remembers what it learned about how to protect the body against that disease.  The body keeps a few T-lymphocytes, called "memory cells," that go into action quickly if the body encounters the same virus again. When the familiar antigens are detected, B-lymphocytes produce antibodies to attack them. Experts are still learning how long these memory cells protect a person against the virus that causes COVID-19.  How COVID-19 Vaccines Work COVID-19 vaccines help our bodies develop immunity to the virus that causes COVID-19 without [place] having to get the illness.  COVID vaccine Different types of vaccines work in different ways to offer protection. But with all types of vaccines, the body is left with a supply of "memory" T-lymphocytes as well as B-lymphocytes that will remember how to fight that virus in the future.  It typically takes a few weeks after vaccination for the body to produce T-lymphocytes and B-lymphocytes. Therefore, it is possible that a person could be infected with the virus that causes COVID-19 just before or just after vaccination and then get sick because the vaccine did not have enough time to provide protection.  Sometimes after vaccination, the process of building immunity can cause symptoms, such as fever. These symptoms are normal and are signs that the body is building immunity.  Learn more about getting your vaccine.  Types of Vaccines Currently, there are three main types of COVID-19 vaccines that are authorized and recommended or undergoing large-scale (Phase 3) clinical trials in the [place].  Below is a description of how each type of vaccine prompts our bodies to recognize and  protect [place] from the virus that causes COVID-19. None of these vaccines can give you COVID-19.  Marland Kitchen mRNA vaccines contain material from the virus that causes COVID-19 that gives our cells instructions for how to make a harmless protein that is unique to  the virus. After our cells make copies of the protein, they destroy the genetic material from the vaccine. Our bodies recognize that the protein should not be there and build T-lymphocytes and B-lymphocytes that will remember how to fight the virus that causes COVID-19 if we are infected in the future. . Protein subunit vaccines include harmless pieces (proteins) of the virus that causes COVID-19 instead of the entire germ. Once vaccinated, our bodies recognize that the protein should not be there and build T-lymphocytes and antibodies that will remember how to fight the virus that causes COVID-19 if we are infected in the future. . Vector vaccines contain a modified version of a different virus than the one that causes COVID-19. Inside the shell of the modified virus, there is material from the virus that causes COVID-19. This is called a "viral vector." Once the viral vector is inside our cells, the genetic material gives cells instructions to make a protein that is unique to the virus that causes COVID-19. Using these instructions, our cells make copies of the protein. This prompts our bodies to build T-lymphocytes and B-lymphocytes that will remember how to fight that virus if we are infected in the future.  Some COVID-19 Vaccines Require More Than One Shot To be fully vaccinated, you will need two shots of some COVID-19 vaccines.  . Two shots: If you get a COVID-19 vaccine that requires two shots, you are considered fully vaccinated two weeks after your second shot. Pfizer-BioNTech and Moderna COVID-19 vaccines require two shots. . One Shot: If you get a COVID-19 vaccine that requires one shot, you are considered fully vaccinated two weeks after your shot. [employer]'s [place] [employer] COVID-19 vaccine only requires one shot. . If it has been less than two weeks since your shot, or if you still need to get your second shot, you are NOT fully protected. Keep taking steps to protect yourself and others  until you are fully vaccinated (two weeks after your final shot).   Information from: CompDrinks.no  -----------------------------------------------------------------------------   Calorie Counting for Weight Loss Calories are units of energy. Your body needs a certain amount of calories from food to keep you going throughout the day. When you eat more calories than your body needs, your body stores the extra calories as fat. When you eat fewer calories than your body needs, your body burns fat to get the energy it needs. Calorie counting means keeping track of how many calories you eat and drink each day. Calorie counting can be helpful if you need to lose weight. If you make sure to eat fewer calories than your body needs, you should lose weight. Ask your health care provider what a healthy weight is for you. For calorie counting to work, you will need to eat the right number of calories in a day in order to lose a healthy amount of weight per week. A dietitian can help you determine how many calories you need in a day and will give you suggestions on how to reach your calorie goal.  A healthy amount of weight to lose per week is usually 1-2 lb (0.5-0.9 kg). This usually means that your daily calorie intake should be reduced by 500-750 calories.  Eating 1,200 -  1,500 calories per day can help most women lose weight.  Eating 1,500 - 1,800 calories per day can help most men lose weight. What is my plan? My goal is to have __________ calories per day. If I have this many calories per day, I should lose around __________ pounds per week. What do I need to know about calorie counting? In order to meet your daily calorie goal, you will need to:  Find out how many calories are in each food you would like to eat. Try to do this before you eat.  Decide how much of the food you plan to eat.  Write down what you ate and how  many calories it had. Doing this is called keeping a food log. To successfully lose weight, it is important to balance calorie counting with a healthy lifestyle that includes regular activity. Aim for 150 minutes of moderate exercise (such as walking) or 75 minutes of vigorous exercise (such as running) each week. Where do I find calorie information?  The number of calories in a food can be found on a Nutrition Facts label. If a food does not have a Nutrition Facts label, try to look up the calories online or ask your dietitian for help. Remember that calories are listed per serving. If you choose to have more than one serving of a food, you will have to multiply the calories per serving by the amount of servings you plan to eat. For example, the label on a package of bread might say that a serving size is 1 slice and that there are 90 calories in a serving. If you eat 1 slice, you will have eaten 90 calories. If you eat 2 slices, you will have eaten 180 calories. How do I keep a food log? Immediately after each meal, record the following information in your food log:  What you ate. Don't forget to include toppings, sauces, and other extras on the food.  How much you ate. This can be measured in cups, ounces, or number of items.  How many calories each food and drink had.  The total number of calories in the meal. Keep your food log near you, such as in a small notebook in your pocket, or use a mobile app or website. Some programs will calculate calories for you and show you how many calories you have left for the day to meet your goal. What are some calorie counting tips?  1. Use your calories on foods and drinks that will fill you up and not leave you hungry: ? Some examples of foods that fill you up are nuts and nut butters, vegetables, lean proteins, and high-fiber foods like whole grains. High-fiber foods are foods with more than 5 g fiber per serving. ? Drinks such as sodas, specialty  coffee drinks, alcohol, and juices have a lot of calories, yet do not fill you up. 2. Eat nutritious foods and avoid empty calories. Empty calories are calories you get from foods or beverages that do not have many vitamins or protein, such as candy, sweets, and soda. It is better to have a nutritious high-calorie food (such as an avocado) than a food with few nutrients (such as a bag of chips). 3. Know how many calories are in the foods you eat most often. This will help you calculate calorie counts faster. 4. Pay attention to calories in drinks. Low-calorie drinks include water and unsweetened drinks. 5. Pay attention to nutrition labels for "low fat" or "fat free"  foods. These foods sometimes have the same amount of calories or more calories than the full fat versions. They also often have added sugar, starch, or salt, to make up for flavor that was removed with the fat. 6. Find a way of tracking calories that works for you. Get creative. Try different apps or programs if writing down calories does not work for you. What are some portion control tips?  Know how many calories are in a serving. This will help you know how many servings of a certain food you can have.  Use a measuring cup to measure serving sizes. You could also try weighing out portions on a kitchen scale. With time, you will be able to estimate serving sizes for some foods.  Take some time to put servings of different foods on your favorite plates, bowls, and cups so you know what a serving looks like.  Try not to eat straight from a bag or box. Doing this can lead to overeating. Put the amount you would like to eat in a cup or on a plate to make sure you are eating the right portion.  Use smaller plates, glasses, and bowls to prevent overeating.  Try not to multitask (for example, watch TV or use your computer) while eating. If it is time to eat, sit down at a table and enjoy your food. This will help you to know when you are  full. It will also help you to be aware of what you are eating and how much you are eating. What are tips for following this plan? Reading food labels  Check the calorie count compared to the serving size. The serving size may be smaller than what you are used to eating.  Check the source of the calories. Make sure the food you are eating is high in vitamins and protein and low in saturated and trans fats. Shopping  Read nutrition labels while you shop. This will help you make healthy decisions before you decide to purchase your food.  Make a grocery list and stick to it. Cooking  Try to cook your favorite foods in a healthier way. For example, try baking instead of frying.  Use low-fat dairy products. Meal planning  Use more fruits and vegetables. Half of your plate should be fruits and vegetables.  Include lean proteins like poultry and fish. How do I count calories when eating out? 1. Ask for smaller portion sizes. 2. Consider sharing an entree and sides instead of getting your own entree. 3. If you get your own entree, eat only half. Ask for a box at the beginning of your meal and put the rest of your entree in it so you are not tempted to eat it. 4. If calories are listed on the menu, choose the lower calorie options. 5. Choose dishes that include vegetables, fruits, whole grains, low-fat dairy products, and lean protein. 6. Choose items that are boiled, broiled, grilled, or steamed. Stay away from items that are buttered, battered, fried, or served with cream sauce. Items labeled "crispy" are usually fried, unless stated otherwise. 7. Choose water, low-fat milk, unsweetened iced tea, or other drinks without added sugar. If you want an alcoholic beverage, choose a lower calorie option such as a glass of wine or light beer. 8. Ask for dressings, sauces, and syrups on the side. These are usually high in calories, so you should limit the amount you eat. 9. If you want a salad, choose  a garden salad and ask  for grilled meats. Avoid extra toppings like bacon, cheese, or fried items. Ask for the dressing on the side, or ask for olive oil and vinegar or lemon to use as dressing. 10. Estimate how many servings of a food you are given. For example, a serving of cooked rice is  cup or about the size of half a baseball. Knowing serving sizes will help you be aware of how much food you are eating at restaurants. The list below tells you how big or small some common portion sizes are based on everyday objects: ? 1 oz--4 stacked dice. ? 3 oz--1 deck of cards. ? 1 tsp--1 die. ? 1 Tbsp-- a ping-pong ball. ? 2 Tbsp--1 ping-pong ball. ?  cup-- baseball. ? 1 cup--1 baseball. Summary  Calorie counting means keeping track of how many calories you eat and drink each day. If you eat fewer calories than your body needs, you should lose weight.  A healthy amount of weight to lose per week is usually 1-2 lb (0.5-0.9 kg). This usually means reducing your daily calorie intake by 500-750 calories.  The number of calories in a food can be found on a Nutrition Facts label. If a food does not have a Nutrition Facts label, try to look up the calories online or ask your dietitian for help.  Use your calories on foods and drinks that will fill you up, and not on foods and drinks that will leave you hungry.  Use smaller plates, glasses, and bowls to prevent overeating. This information is not intended to replace advice given to you by your health care provider. Make sure you discuss any questions you have with your health care provider.  Document Revised: 04/22/2018 Document Reviewed: 07/03/2016 Elsevier Patient Education  2020 [employer].  --------------------------------------------------------------------------------------  Exercising to [employer] Exercise is structured, repetitive physical activity to improve fitness and health. Getting regular exercise is important for everyone. It is  especially important if you are overweight. Being overweight increases your risk of heart disease, stroke, diabetes, high blood pressure, and several types of cancer. Reducing your calorie intake and exercising can help you lose weight. Exercise is usually categorized as moderate or vigorous intensity. To lose weight, most people need to do a certain amount of moderate-intensity or vigorous-intensity exercise each week. Moderate-intensity exercise  Moderate-intensity exercise is any activity that gets you moving enough to burn at least three times more energy (calories) than if you were sitting. Examples of moderate exercise include:  Walking a mile in 15 minutes.  Doing light yard work.  Biking at an easy pace. Most people should get at least 150 minutes (2 hours and 30 minutes) a week of moderate-intensity exercise to maintain their body weight. Vigorous-intensity exercise Vigorous-intensity exercise is any activity that gets you moving enough to burn at least six times more calories than if you were sitting. When you exercise at this intensity, you should be working hard enough that you are not able to carry on a conversation. Examples of vigorous exercise include:  Running.  Playing a team sport, such as football, basketball, and soccer.  Jumping rope. Most people should get at least 75 minutes (1 hour and 15 minutes) a week of vigorous-intensity exercise to maintain their body weight. How can exercise affect me? When you exercise enough to burn more calories than you eat, you lose weight. Exercise also reduces body fat and builds muscle. The more muscle you have, the more calories you burn. Exercise also:  Improves mood.  Reduces stress and tension.  Improves your overall fitness, flexibility, and endurance.  Increases bone strength. The amount of exercise you need to lose weight depends on:  Your age.  The type of exercise.  Any health conditions you have.  Your overall  physical ability. Talk to your health care provider about how much exercise you need and what types of activities are safe for you. What actions can I take to lose weight? Nutrition  7. Make changes to your diet as told by your health care provider or diet and nutrition specialist (dietitian). This may include: ? Eating fewer calories. ? Eating more protein. ? Eating less unhealthy fats. ? Eating a diet that includes fresh fruits and vegetables, whole grains, low-fat dairy products, and lean protein. ? Avoiding foods with added fat, salt, and sugar. 8. Drink plenty of water while you exercise to prevent dehydration or heat stroke. Activity 11. Choose an activity that you enjoy and set realistic goals. Your health care provider can help you make an exercise plan that works for you. 12. Exercise at a moderate or vigorous intensity most days of the week. ? The intensity of exercise may vary from person to person. You can tell how intense a workout is for you by paying attention to your breathing and heartbeat. Most people will notice their breathing and heartbeat get faster with more intense exercise. 13. Do resistance training twice each week, such as: ? Push-ups. ? Sit-ups. ? Lifting weights. ? Using resistance bands. 14. Getting short amounts of exercise can be just as helpful as long structured periods of exercise. If you have trouble finding time to exercise, try to include exercise in your daily routine. ? Get up, stretch, and walk around every 30 minutes throughout the day. ? Go for a walk during your lunch break. ? Park your car farther away from your destination. ? If you take public transportation, get off one stop early and walk the rest of the way. ? Make phone calls while standing up and walking around. ? Take the stairs instead of elevators or escalators. 15. Wear comfortable clothes and shoes with good support. 16. Do not exercise so much that you hurt yourself, feel dizzy,  or get very short of breath. Where to find more information  [place] Department of Health and Human Services: ThisPath.fi  Centers for Disease Control and Prevention (CDC): FootballExhibition.com.br Contact a health care provider:  Before starting a new exercise program.  If you have questions or concerns about your weight.  If you have a medical problem that keeps you from exercising. Get help right away if you have any of the following while exercising:  Injury.  Dizziness.  Difficulty breathing or shortness of breath that does not go away when you stop exercising.  Chest pain.  Rapid heartbeat. Summary  Being overweight increases your risk of heart disease, stroke, diabetes, high blood pressure, and several types of cancer.  Losing weight happens when you burn more calories than you eat.  Reducing the amount of calories you eat in addition to getting regular moderate or vigorous exercise each week helps you lose weight. This information is not intended to replace advice given to you by your health care provider. Make sure you discuss any questions you have with your health care provider.  Document Revised: 08/16/2017 Document Reviewed: 08/16/2017 Elsevier Patient Education  2020 [employer].

## 2020-04-19 NOTE — Assessment & Plan Note (Addendum)
-   I encouraged the patient to lose weight.  - I educated them on making healthy dietary choices including eating more fruits and vegetables and less fried foods. - I encouraged the patient to exercise more, and educated on the benefits of exercise including weight loss, diabetes management, and hypertension management.  - I encouraged the pt to consider weight loss surgery.  - I encouraged the pt to get the COVID shot.

## 2020-04-23 ENCOUNTER — Other Ambulatory Visit: Payer: Self-pay | Admitting: *Deleted

## 2020-04-23 MED ORDER — ENALAPRIL MALEATE 5 MG PO TABS: 5.0000 mg | ORAL_TABLET | Freq: Every day | ORAL | 6 refills | Status: DC

## 2020-05-16 DIAGNOSIS — Z20822 Contact with and (suspected) exposure to covid-19: Secondary | ICD-10-CM | POA: Diagnosis not present

## 2020-05-20 DIAGNOSIS — Z20822 Contact with and (suspected) exposure to covid-19: Secondary | ICD-10-CM | POA: Diagnosis not present

## 2020-05-28 DIAGNOSIS — Z20822 Contact with and (suspected) exposure to covid-19: Secondary | ICD-10-CM | POA: Diagnosis not present

## 2020-06-03 DIAGNOSIS — Z20822 Contact with and (suspected) exposure to covid-19: Secondary | ICD-10-CM | POA: Diagnosis not present

## 2020-06-11 DIAGNOSIS — Z20822 Contact with and (suspected) exposure to covid-19: Secondary | ICD-10-CM | POA: Diagnosis not present

## 2020-06-17 DIAGNOSIS — Z20822 Contact with and (suspected) exposure to covid-19: Secondary | ICD-10-CM | POA: Diagnosis not present

## 2020-06-24 DIAGNOSIS — Z20822 Contact with and (suspected) exposure to covid-19: Secondary | ICD-10-CM | POA: Diagnosis not present

## 2020-07-19 DIAGNOSIS — Z20822 Contact with and (suspected) exposure to covid-19: Secondary | ICD-10-CM | POA: Diagnosis not present

## 2020-07-29 ENCOUNTER — Ambulatory Visit: Payer: BC Managed Care – PPO | Admitting: Internal Medicine

## 2020-08-08 ENCOUNTER — Other Ambulatory Visit: Payer: Self-pay | Admitting: Orthopedic Surgery

## 2020-08-08 DIAGNOSIS — M25532 Pain in left wrist: Secondary | ICD-10-CM

## 2020-08-08 DIAGNOSIS — M25522 Pain in left elbow: Secondary | ICD-10-CM

## 2020-08-12 ENCOUNTER — Other Ambulatory Visit: Payer: Self-pay | Admitting: Orthopedic Surgery

## 2020-08-12 DIAGNOSIS — M25522 Pain in left elbow: Secondary | ICD-10-CM

## 2020-08-12 DIAGNOSIS — M25532 Pain in left wrist: Secondary | ICD-10-CM

## 2020-09-03 ENCOUNTER — Other Ambulatory Visit: Payer: Self-pay | Admitting: Internal Medicine

## 2020-09-04 ENCOUNTER — Other Ambulatory Visit: Payer: Self-pay

## 2020-09-04 ENCOUNTER — Ambulatory Visit
Admission: RE | Admit: 2020-09-04 | Discharge: 2020-09-04 | Disposition: A | Discharge status: Home / Self Care | Payer: Worker's Compensation | Source: Ambulatory Visit | Attending: Orthopedic Surgery | Admitting: Orthopedic Surgery

## 2020-09-04 DIAGNOSIS — M25522 Pain in left elbow: Secondary | ICD-10-CM

## 2020-09-04 DIAGNOSIS — M25532 Pain in left wrist: Secondary | ICD-10-CM

## 2020-09-04 MED ORDER — IOPAMIDOL (ISOVUE-M 200) INJECTION 41%
4.0000 mL | Freq: Once | INTRAMUSCULAR | Status: AC
  Administered 2020-09-04: 4 mL via INTRA_ARTICULAR

## 2020-09-05 ENCOUNTER — Other Ambulatory Visit: Payer: Self-pay | Admitting: *Deleted

## 2020-09-05 MED ORDER — ENALAPRIL MALEATE 5 MG PO TABS: 5.0000 mg | ORAL_TABLET | Freq: Every day | ORAL | 2 refills | Status: DC

## 2020-12-11 ENCOUNTER — Other Ambulatory Visit: Payer: Self-pay | Admitting: *Deleted

## 2020-12-11 DIAGNOSIS — I10 Essential (primary) hypertension: Secondary | ICD-10-CM

## 2020-12-11 MED ORDER — LISINOPRIL 20 MG PO TABS: 20.0000 mg | ORAL_TABLET | Freq: Every day | ORAL | 3 refills | Status: DC

## 2020-12-12 ENCOUNTER — Other Ambulatory Visit: Payer: Self-pay | Admitting: *Deleted

## 2020-12-12 MED ORDER — ENALAPRIL MALEATE 5 MG PO TABS: 5.0000 mg | ORAL_TABLET | Freq: Every day | ORAL | 2 refills | Status: DC

## 2021-01-09 DIAGNOSIS — Z6841 Body Mass Index (BMI) 40.0 and over, adult: Secondary | ICD-10-CM | POA: Diagnosis not present

## 2021-01-09 DIAGNOSIS — Z79899 Other long term (current) drug therapy: Secondary | ICD-10-CM | POA: Diagnosis not present

## 2021-01-09 DIAGNOSIS — M79641 Pain in right hand: Secondary | ICD-10-CM | POA: Diagnosis not present

## 2021-02-24 ENCOUNTER — Other Ambulatory Visit: Payer: Self-pay

## 2021-02-24 ENCOUNTER — Encounter: Payer: Self-pay | Admitting: Internal Medicine

## 2021-02-24 ENCOUNTER — Ambulatory Visit: Payer: BC Managed Care – PPO | Admitting: Internal Medicine

## 2021-02-24 VITALS — BP 152/84 | HR 88 | Ht 74.0 in | Wt 388.9 lb

## 2021-02-24 DIAGNOSIS — R0683 Snoring: Secondary | ICD-10-CM

## 2021-02-24 DIAGNOSIS — M05742 Rheumatoid arthritis with rheumatoid factor of left hand without organ or systems involvement: Secondary | ICD-10-CM

## 2021-02-24 DIAGNOSIS — Z6841 Body Mass Index (BMI) 40.0 and over, adult: Secondary | ICD-10-CM

## 2021-02-24 DIAGNOSIS — M05741 Rheumatoid arthritis with rheumatoid factor of right hand without organ or systems involvement: Secondary | ICD-10-CM | POA: Diagnosis not present

## 2021-02-24 DIAGNOSIS — Z87442 Personal history of urinary calculi: Secondary | ICD-10-CM | POA: Diagnosis not present

## 2021-02-24 DIAGNOSIS — I10 Essential (primary) hypertension: Secondary | ICD-10-CM

## 2021-02-24 DIAGNOSIS — E66813 Obesity, class 3: Secondary | ICD-10-CM

## 2021-02-24 NOTE — Assessment & Plan Note (Signed)
We will schedule for sleep study.  Patient was advised again to lose weight.  Weight loss surgery were discussed, but patient did not want to the present time

## 2021-02-24 NOTE — Assessment & Plan Note (Signed)
No further episodes of renal colic

## 2021-02-24 NOTE — Assessment & Plan Note (Signed)

## 2021-02-24 NOTE — Assessment & Plan Note (Signed)

## 2021-02-24 NOTE — Assessment & Plan Note (Signed)
Stable at the present time. 

## 2021-02-24 NOTE — Progress Notes (Signed)
Established Patient Office Visit  Subjective:  Patient ID: Hector Hancock, male    DOB: 11/18/1965  Age: 55 y.o. MRN: [id number]  CC:  Chief Complaint  Patient presents with   Sleep study request    Patient is here to inquire about having a sleep study. Patient has been told he needs a sleep study due to obesity, hypertension and neck circumference is too large.    HPI  Hector Hancock presents for obesity, snoring,and rh  arthiritis  Past Medical History:  Diagnosis Date   Arthritis    History of kidney stones    Hypertension     Past Surgical History:  Procedure Laterality Date   COLONOSCOPY WITH PROPOFOL N/A 12/27/2015   Procedure: COLONOSCOPY WITH PROPOFOL;  Surgeon: Midge Minium, MD;  Location: [facility];  Service: Endoscopy;  Laterality: N/A;   EXTRACORPOREAL SHOCK WAVE LITHOTRIPSY Left 07/28/2018   Procedure: EXTRACORPOREAL SHOCK WAVE LITHOTRIPSY (ESWL);  Surgeon: Orson Ape, MD;  Location: [facility] ORS;  Service: Urology;  Laterality: Left;   EXTRACORPOREAL SHOCK WAVE LITHOTRIPSY Right 01/04/2020   Procedure: EXTRACORPOREAL SHOCK WAVE LITHOTRIPSY (ESWL);  Surgeon: Orson Ape, MD;  Location: [facility] ORS;  Service: Urology;  Laterality: Right;   POLYPECTOMY  12/27/2015   Procedure: POLYPECTOMY;  Surgeon: Midge Minium, MD;  Location: [facility];  Service: Endoscopy;;   SACROILIAC JOINT FUSION Right 09/06/2013   Procedure: SACROILIAC JOINT FUSION;  Surgeon: Emilee Hero, MD;  Location: [facility] OR;  Service: Orthopedics;  Laterality: Right;  Right sided sacroiliac joint fusion   TOTAL SHOULDER ARTHROPLASTY Left     Family History  Problem Relation Age of Onset   Diabetes Mother    Diabetes Sister    Diabetes Sister     Social History   Socioeconomic History   Marital status: Married    Spouse name: Not on file   Number of children: Not on file   Years of education: Not on file   Highest education level: Not on file  Occupational History    Not on file  Tobacco Use   Smoking status: Never   Smokeless tobacco: Never  Substance and Sexual Activity   Alcohol use: No   Drug use: No   Sexual activity: Not on file  Other Topics Concern   Not on file  Social History Narrative   Not on file   Social Determinants of Health   Financial Resource Strain: Not on file  Food Insecurity: Not on file  Transportation Needs: Not on file  Physical Activity: Not on file  Stress: Not on file  Social Connections: Not on file  Intimate Partner Violence: Not on file     Current Outpatient Medications:    enalapril (VASOTEC) 5 MG tablet, Take 1 tablet (5 mg total) by mouth daily., Disp: 90 tablet, Rfl: 2   hydroxychloroquine (PLAQUENIL) 200 MG tablet, Take 200 mg by mouth daily., Disp: , Rfl:    NUCYNTA 50 MG tablet, Take 1 tablet (50 mg total) by mouth every 6 (six) hours as needed for moderate pain. 1 TO 2 TABS Q 6 HOURS PRN PAIN, Disp: 20 tablet, Rfl: 0   ondansetron (ZOFRAN ODT) 8 MG disintegrating tablet, Take 1 tablet (8 mg total) by mouth every 6 (six) hours as needed for nausea or vomiting., Disp: 4 tablet, Rfl: 3   Allergies  Allergen Reactions   Codeine Itching    ROS Review of Systems  Constitutional: Negative.   HENT: Negative.  Eyes: Negative.   Respiratory: Negative.    Cardiovascular: Negative.   Gastrointestinal: Negative.   Endocrine: Negative.   Genitourinary: Negative.   Musculoskeletal: Negative.   Skin: Negative.   Allergic/Immunologic: Negative.   Neurological: Negative.   Hematological: Negative.   Psychiatric/Behavioral: Negative.    All other systems reviewed and are negative.    Objective:    Physical Exam Vitals reviewed.  Constitutional:      Appearance: Normal appearance.  HENT:     Mouth/Throat:     Mouth: Mucous membranes are moist.  Eyes:     Pupils: Pupils are equal, round, and reactive to light.  Neck:     Vascular: No carotid bruit.  Cardiovascular:     Rate and Rhythm:  Normal rate and regular rhythm.     Pulses: Normal pulses.     Heart sounds: Normal heart sounds.  Pulmonary:     Effort: Pulmonary effort is normal.     Breath sounds: Normal breath sounds.  Abdominal:     General: Bowel sounds are normal.     Palpations: Abdomen is soft. There is no hepatomegaly, splenomegaly or mass.     Tenderness: There is no abdominal tenderness.     Hernia: No hernia is present.  Musculoskeletal:     Cervical back: Neck supple.     Right lower leg: No edema.     Left lower leg: No edema.  Skin:    Findings: No rash.  Neurological:     Mental Status: He is alert and oriented to person, place, and time.     Motor: No weakness.  Psychiatric:        Mood and Affect: Mood normal.        Behavior: Behavior normal.    BP (!) 152/84   Pulse 88   Ht 6\' 2"  (1.88 m)   Wt (!) 388 lb 14.4 oz (176.4 kg)   BMI 49.93 kg/m  Wt Readings from Last 3 Encounters:  02/24/21 (!) 388 lb 14.4 oz (176.4 kg)  04/19/20 (!) 379 lb 3.2 oz (172 kg)  01/04/20 (!) 358 lb (162.4 kg)     Health Maintenance Due  Topic Date Due   COVID-19 Vaccine (1) Never done   Pneumococcal Vaccine 24-64 Years old (1 - PCV) Never done   HIV Screening  Never done   Hepatitis C Screening  Never done   TETANUS/TDAP  Never done   Zoster Vaccines- Shingrix (1 of 2) Never done    There are no preventive care reminders to display for this patient.  No results found for: TSH Lab Results  Component Value Date   WBC 9.6 06/18/2014   HGB 15.4 06/18/2014   HCT 47.2 06/18/2014   MCV 93 06/18/2014   PLT 209 06/18/2014   Lab Results  Component Value Date   NA 141 06/18/2014   K 3.8 06/18/2014   CO2 27 06/18/2014   GLUCOSE 119 (H) 06/18/2014   BUN 15 06/18/2014   CREATININE 1.14 06/18/2014   BILITOT 0.5 06/18/2014   ALKPHOS 68 06/18/2014   AST 39 (H) 06/18/2014   ALT 48 06/18/2014   PROT 7.5 06/18/2014   ALBUMIN 3.7 06/18/2014   CALCIUM 8.1 (L) 06/18/2014   ANIONGAP 9 06/18/2014   Lab  Results  Component Value Date   CHOL 140 06/19/2014   Lab Results  Component Value Date   HDL 29 (L) 06/19/2014   Lab Results  Component Value Date   LDLCALC 83 06/19/2014  Lab Results  Component Value Date   TRIG 140 06/19/2014   No results found for: [facility] Lab Results  Component Value Date   HGBA1C 5.6 12/30/2011      Assessment & Plan:   Problem List Items Addressed This Visit       Cardiovascular and Mediastinum   Hypertension - Primary     Patient denies any chest pain or shortness of breath there is no history of palpitation or paroxysmal nocturnal dyspnea   patient was advised to follow low-salt low-cholesterol diet    ideally I want to keep systolic blood pressure below 009 mmHg, patient was asked to check blood pressure one times a week and give me a report on that.  Patient will be follow-up in 3 months  or earlier as needed, patient will call me back for any change in the cardiovascular symptoms Patient was advised to buy a book from local bookstore concerning blood pressure and read several chapters  every day.  This will be supplemented by some of the material we will give him from the office.  Patient should also utilize other resources like YouTube and Internet to learn more about the blood pressure and the diet.         Musculoskeletal and Integument   Rheumatoid arthritis involving both hands with positive rheumatoid factor (HCC)    Stable at the present time.         Other   Class 3 severe obesity due to excess calories with serious comorbidity and body mass index (BMI) of 45.0 to 49.9 in adult [facility])    - I encouraged the patient to lose weight.  - I educated them on making healthy dietary choices including eating more fruits and vegetables and less fried foods. - I encouraged the patient to exercise more, and educated on the benefits of exercise including weight loss, diabetes prevention, and hypertension prevention.   Dietary counseling with a  registered dietician  Referral to a weight management support group (e.g. Weight Watchers, Overeaters Anonymous)  If your BMI is greater than 29 or you have gained more than 15 pounds you should work on weight loss.  Attend a healthy cooking class        History of nephrolithiasis    No further episodes of renal colic       Snoring    We will schedule for sleep study.  Patient was advised again to lose weight.  Weight loss surgery were discussed, but patient did not want to the present time        No orders of the defined types were placed in this encounter.        Corky Downs, MD

## 2021-03-23 ENCOUNTER — Emergency Department: Payer: BC Managed Care – PPO

## 2021-03-23 ENCOUNTER — Encounter: Payer: Self-pay | Admitting: Emergency Medicine

## 2021-03-23 ENCOUNTER — Ambulatory Visit (INDEPENDENT_AMBULATORY_CARE_PROVIDER_SITE_OTHER): Payer: BC Managed Care – PPO

## 2021-03-23 ENCOUNTER — Other Ambulatory Visit: Payer: Self-pay

## 2021-03-23 ENCOUNTER — Ambulatory Visit
Admission: EM | Admit: 2021-03-23 | Discharge: 2021-03-23 | Disposition: A | Discharge status: Home / Self Care | Payer: BC Managed Care – PPO | Attending: Emergency Medicine | Admitting: Emergency Medicine

## 2021-03-23 ENCOUNTER — Emergency Department
Admission: EM | Admit: 2021-03-23 | Discharge: 2021-03-23 | Disposition: A | Discharge status: Home / Self Care | Payer: BC Managed Care – PPO | Attending: Emergency Medicine | Admitting: Emergency Medicine

## 2021-03-23 DIAGNOSIS — R0602 Shortness of breath: Secondary | ICD-10-CM | POA: Diagnosis not present

## 2021-03-23 DIAGNOSIS — R600 Localized edema: Secondary | ICD-10-CM | POA: Insufficient documentation

## 2021-03-23 DIAGNOSIS — I10 Essential (primary) hypertension: Secondary | ICD-10-CM | POA: Diagnosis not present

## 2021-03-23 DIAGNOSIS — R079 Chest pain, unspecified: Secondary | ICD-10-CM | POA: Diagnosis not present

## 2021-03-23 DIAGNOSIS — R002 Palpitations: Secondary | ICD-10-CM | POA: Diagnosis not present

## 2021-03-23 DIAGNOSIS — R0601 Orthopnea: Secondary | ICD-10-CM | POA: Diagnosis not present

## 2021-03-23 DIAGNOSIS — M7989 Other specified soft tissue disorders: Secondary | ICD-10-CM | POA: Insufficient documentation

## 2021-03-23 DIAGNOSIS — Z96612 Presence of left artificial shoulder joint: Secondary | ICD-10-CM | POA: Insufficient documentation

## 2021-03-23 DIAGNOSIS — R0789 Other chest pain: Secondary | ICD-10-CM | POA: Diagnosis not present

## 2021-03-23 DIAGNOSIS — R0609 Other forms of dyspnea: Secondary | ICD-10-CM

## 2021-03-23 DIAGNOSIS — Z79899 Other long term (current) drug therapy: Secondary | ICD-10-CM | POA: Diagnosis not present

## 2021-03-23 DIAGNOSIS — I517 Cardiomegaly: Secondary | ICD-10-CM | POA: Diagnosis not present

## 2021-03-23 DIAGNOSIS — R0600 Dyspnea, unspecified: Secondary | ICD-10-CM

## 2021-03-23 LAB — BASIC METABOLIC PANEL
Anion gap: 10 (ref 5–15)
BUN: 15 mg/dL (ref 6–20)
CO2: 22 mmol/L (ref 22–32)
Calcium: 8.7 mg/dL — ABNORMAL LOW (ref 8.9–10.3)
Chloride: 107 mmol/L (ref 98–111)
Creatinine, Ser: 1.05 mg/dL (ref 0.61–1.24)
GFR, Estimated: >60 mL/min (ref >60)
Glucose, Bld: 133 mg/dL — ABNORMAL HIGH (ref 70–99)
Potassium: 4 mmol/L (ref 3.5–5.1)
Sodium: 139 mmol/L (ref 135–145)

## 2021-03-23 LAB — CBC
HCT: 48 % (ref 39.0–52.0)
Hemoglobin: 16.1 g/dL (ref 13.0–17.0)
MCH: 30.6 pg (ref 26.0–34.0)
MCHC: 33.5 g/dL (ref 30.0–36.0)
MCV: 91.1 fL (ref 80.0–100.0)
Platelets: 160 10*3/uL (ref 150–400)
RBC: 5.27 MIL/uL (ref 4.22–5.81)
RDW: 14.2 % (ref 11.5–15.5)
WBC: 7.6 10*3/uL (ref 4.0–10.5)
nRBC: 0 % (ref 0.0–0.2)

## 2021-03-23 LAB — TROPONIN I (HIGH SENSITIVITY)
Troponin I (High Sensitivity): 12 ng/L (ref <18)
Troponin I (High Sensitivity): 10 ng/L (ref <18)

## 2021-03-23 LAB — BRAIN NATRIURETIC PEPTIDE: B Natriuretic Peptide: 32.2 pg/mL (ref 0.0–100.0)

## 2021-03-23 NOTE — ED Provider Notes (Signed)
[facility]  ____________________________________________   Event Date/Time   First MD Initiated Contact with Patient 03/23/21 1045     (approximate)  I have reviewed the triage vital signs and the nursing notes.   HISTORY  Chief Complaint Shortness of Breath    HPI Hector Hancock is a 55 y.o. male with past medical history of hypertension and arthritis who presents with dyspnea.  Over the past 2 weeks he has felt short of breath especially with exertion.  Today he had difficulty sleeping as well was given exertional dyspnea.  He notes that when walking and he had some left-sided chest tightness with walking that was associated with his dyspnea.  He does not have chest pain at this time.  He denies cough congestion fevers or chills.  Denies abdominal pain nausea or vomiting.  He notes that he has chronic swelling of the left leg which is unchanged from previous.  No prior history of CHF or diuretic use.  He is currently being worked up for sleep apnea, but has not yet had a sleep study.    Past Medical History:  Diagnosis Date   Arthritis    History of kidney stones    Hypertension     Patient Active Problem List   Diagnosis Date Noted   Snoring 02/24/2021   Rheumatoid arthritis involving both hands with positive rheumatoid factor (HCC) 04/19/2020   Class 3 severe obesity due to excess calories with serious comorbidity and body mass index (BMI) of 45.0 to 49.9 in adult [facility]) 04/19/2020   History of nephrolithiasis 04/19/2020   Hypertension    Special screening for malignant neoplasms, colon     Past Surgical History:  Procedure Laterality Date   COLONOSCOPY WITH PROPOFOL N/A 12/27/2015   Procedure: COLONOSCOPY WITH PROPOFOL;  Surgeon: Midge Minium, MD;  Location: [facility];  Service: Endoscopy;  Laterality: N/A;   EXTRACORPOREAL SHOCK WAVE LITHOTRIPSY Left 07/28/2018   Procedure: EXTRACORPOREAL SHOCK WAVE LITHOTRIPSY (ESWL);  Surgeon:  Orson Ape, MD;  Location: [facility] ORS;  Service: Urology;  Laterality: Left;   EXTRACORPOREAL SHOCK WAVE LITHOTRIPSY Right 01/04/2020   Procedure: EXTRACORPOREAL SHOCK WAVE LITHOTRIPSY (ESWL);  Surgeon: Orson Ape, MD;  Location: [facility] ORS;  Service: Urology;  Laterality: Right;   POLYPECTOMY  12/27/2015   Procedure: POLYPECTOMY;  Surgeon: Midge Minium, MD;  Location: [facility];  Service: Endoscopy;;   SACROILIAC JOINT FUSION Right 09/06/2013   Procedure: SACROILIAC JOINT FUSION;  Surgeon: Emilee Hero, MD;  Location: [facility] OR;  Service: Orthopedics;  Laterality: Right;  Right sided sacroiliac joint fusion   TOTAL SHOULDER ARTHROPLASTY Left     Prior to Admission medications   Medication Sig Start Date End Date Taking? Authorizing Provider  enalapril (VASOTEC) 5 MG tablet Take 1 tablet (5 mg total) by mouth daily. 12/12/20   Corky Downs, MD  hydroxychloroquine (PLAQUENIL) 200 MG tablet Take 200 mg by mouth daily.    [provider]  NUCYNTA 50 MG tablet Take 1 tablet (50 mg total) by mouth every 6 (six) hours as needed for moderate pain. 1 TO 2 TABS Q 6 HOURS PRN PAIN 07/28/18   Orson Ape, MD  ondansetron [facility] ODT) 8 MG disintegrating tablet Take 1 tablet (8 mg total) by mouth every 6 (six) hours as needed for nausea or vomiting. 07/28/18   Orson Ape, MD    Allergies Codeine and Prednisone  Family History  Problem Relation Age of Onset  Diabetes Mother    Diabetes Sister    Diabetes Sister     Social History Social History   Tobacco Use   Smoking status: Never   Smokeless tobacco: Never  Substance Use Topics   Alcohol use: No   Drug use: No    Review of Systems   Review of Systems  Constitutional:  Negative for chills and fever.  Respiratory:  Positive for chest tightness and shortness of breath. Negative for cough.   Cardiovascular:  Positive for chest pain. Negative for palpitations and leg swelling.  Gastrointestinal:   Negative for abdominal pain, nausea and vomiting.  All other systems reviewed and are negative.  Physical Exam Updated Vital Signs BP 117/83   Pulse 77   Temp 98.2 F (36.8 C) (Oral)   Resp 19   Ht  (1.88 m)   Wt (!) 170 kg   SpO2 93%   BMI 48.12 kg/m   Physical Exam Vitals and nursing note reviewed.  Constitutional:      General: He is not in acute distress.    Appearance: Normal appearance.  HENT:     Head: Normocephalic and atraumatic.  Eyes:     General: No scleral icterus.    Conjunctiva/sclera: Conjunctivae normal.  Cardiovascular:     Rate and Rhythm: Normal rate and regular rhythm.  Pulmonary:     Effort: Pulmonary effort is normal. No respiratory distress.     Breath sounds: Normal breath sounds. No wheezing.  Musculoskeletal:        General: No deformity or signs of injury.     Cervical back: Normal range of motion.     Right lower leg: Edema present.     Left lower leg: Edema present.     Comments: Bilateral nonpitting edema with chronic venous stasis changes, left greater than right  Skin:    Coloration: Skin is not jaundiced or pale.  Neurological:     General: No focal deficit present.     Mental Status: He is alert and oriented to person, place, and time. Mental status is at baseline.  Psychiatric:        Mood and Affect: Mood normal.        Behavior: Behavior normal.     LABS (all labs ordered are listed, but only abnormal results are displayed)  Labs Reviewed  BASIC METABOLIC PANEL - Abnormal; Notable for the following components:      Result Value   Glucose, Bld 133 (*)    Calcium 8.7 (*)    All other components within normal limits  CBC  BRAIN NATRIURETIC PEPTIDE  TROPONIN I (HIGH SENSITIVITY)  TROPONIN I (HIGH SENSITIVITY)   ____________________________________________  EKG  Normal sinus rhythm, normal axis, no acute ischemic changes ____________________________________________  RADIOLOGY Ky Barban, personally  viewed and evaluated these images (plain radiographs) as part of my medical decision making, as well as reviewing the written report by the radiologist.  ED MD interpretation: I reviewed the chest x-ray which does not show any acute cardiopulmonary process    ____________________________________________   PROCEDURES  Procedure(s) performed (including Critical Care):  Procedures   ____________________________________________   INITIAL IMPRESSION / ASSESSMENT AND PLAN / ED COURSE     Patient is 55 year old male who presents with exertional dyspnea over the past 2 weeks.  This was associated with chest tightness today.  Seen at urgent care and referred to the ED.  His EKG is nonischemic initial high-sensitivity troponin is 12.  His chest  x-ray does not show any pulmonary edema or infiltrate.  BNP also within normal limits.  I performed a limited bedside echo which shows a normal EF and normal right heart size.  My suspicion for CHF based on his labs and echo was low.  Possible that he has some undiagnosed pulmonary hypertension.  Given he is not hypoxic or tachycardic and minimally symptomatic at rest lower suspicion for pulmonary embolism and his leg swelling that is asymmetric seems to be chronic and he has no other risk factors for PE.  I am considering unstable angina especially with his exertional chest pain today.  We will perform a second high-sensitivity troponin discussed with cardiology.  Second high-sensitivity troponin is 10.  I discussed with Dr. Gwen Pounds on-call given the potential that this is unstable angina.  He agreed with the plan for follow-up and he patient can be seen in the office tomorrow. We discussed return precautions and the need to f/u tomorrow. Patient felt comfortable with the plan.       ____________________________________________   FINAL CLINICAL IMPRESSION(S) / ED DIAGNOSES  Final diagnoses:  Dyspnea on exertion     ED Discharge Orders      None        Note:  This document was prepared using Dragon voice recognition software and may include unintentional dictation errors.    Georga Hacking, MD 03/23/21 [phone]

## 2021-03-23 NOTE — ED Triage Notes (Signed)
Pt presents today with c/o of SOB and chest tightness/burning x 1 wk.

## 2021-03-23 NOTE — Discharge Instructions (Signed)
Please call Dr. Philemon Kingdom office tomorrow so that you can be seen in clinic.

## 2021-03-23 NOTE — ED Notes (Signed)
Pt to ED POV c/o SOB upon exertion, pt states his SOB has gotten worse over the past 3 days and gets worse with activity. Pt has non radiating L sided chest tightness that occurs with exertion.  Pt went to UC for SOB, they did CXR and advised to come to ED. Pt states he has to prop pillows up at night to help him breathe. Pt LLE nonpitting edema.  Denies hx of CHF, COPD, and asthma.  A&O X4, NAD at this time.

## 2021-03-23 NOTE — ED Triage Notes (Signed)
Pt comes pov with SOB for about a week. UC sent pt here for concern over xrays and "something with the heart". Pt denies being sick. Just increasingly SOB when walking or exerting himself. Has been having to prop himself up to sleep.

## 2021-03-23 NOTE — Discharge Instructions (Addendum)
Your current condition warrants further evaluation and/or treatment which exceed services available to you in this urgent care setting. I have discussed with you your currrent condition and the need for further evaluation and/or treatment in an emergency department setting. In response to my medical recommendation, you have opted to go to the emergency department. 

## 2021-03-23 NOTE — ED Provider Notes (Signed)
CHIEF COMPLAINT:   Chief Complaint  Patient presents with   Shortness of Breath   Chest Pain     SUBJECTIVE/HPI:  HPI A very pleasant 55 y.o.Male presents today with shortness of breath and chest tightness that started about a week ago.  Patient reports burning to the left side of his chest when he has episodes of shortness of breath.  Patient states that he has recently started working 8 hours a day after a left wrist surgery.  Patient states that this seemed to come on before that.  Patient reports that very small distances of walking will make him short of breath.  Patient reports burning to the left side of his chest which is off and on.  Patient states that last night he began having trouble breathing at night and had to use extra pillows.  Patient also reports some left lower extremity swelling.  Patient reports that over the last couple of days his heart has been racing.  Patient denies any history of known CHF, diuretic use, history of anxiety, known sick contacts, recent infection with COVID-19.  Patient does not report any visual changes, weakness, tingling, headache, nausea, vomiting, diarrhea, fever, chills.   has a past medical history of Arthritis, History of kidney stones, and Hypertension. ROS:  Review of Systems See Subjective/HPI Medications, Allergies and Problem List personally reviewed in Epic today OBJECTIVE:   Vitals:   03/23/21 0815  BP: (!) 150/85  Pulse: 88  Resp: (!) 24  Temp: 98.2 F (36.8 C)  SpO2: 95%    Physical Exam   General: Appears well-developed and well-nourished. No acute distress.  HEENT Head: Normocephalic and atraumatic.  Ears: Hearing grossly intact, no drainage or visible deformity.  Eyes: Conjunctivae and EOM are normal. No eye drainage or scleral icterus bilaterally.  Neck: Normal range of motion, neck is supple.  Cardiovascular: Normal rate . Regular rhythm; no murmurs, gallops, or rubs.  Pulm/Chest: No respiratory distress.  Left  upper lung field CTA.  Right upper lung field mild rhonchi.  Left lower and right lower lung fields mildly diminished breath sounds. Musculoskeletal: No joint deformity, normal range of motion.  Left lower extremity mild swelling appreciated. Neurological: Alert and oriented to person, place, and time.  Skin: Skin is warm and dry.  Psychiatric: Normal mood, affect, behavior, and thought content.   Vital signs and nursing note reviewed.   Patient stable and cooperative with examination.  LABS/X-RAYS/EKG/MEDS:   No results found for any visits on 03/23/21. -ECG shows NSR with possible anterior infarct age-indeterminate.  There is a mildly increased PR interval from his prior in 2015, otherwise remains unchanged without any acute ST or T wave changes noted.  As read by me, overread pending.  -CXR: Patchy opacities and heart enlargement from last chest x-ray which was completed in 2015, As read by me.  Overread pending. MEDICAL DECISION MAKING:   Patient presents with shortness of breath and chest tightness that started about a week ago.  Patient reports burning to the left side of his chest when he has episodes of shortness of breath.  Patient states that he has recently started working 8 hours a day after a left wrist surgery.  Patient states that this seemed to come on before that.  Patient reports that very small distances of walking will make him short of breath.  Patient reports burning to the left side of his chest which is off and on.  Patient states that last night he began having  trouble breathing at night and had to use extra pillows.  Patient also reports some left lower extremity swelling.  Patient reports that over the last couple of days his heart has been racing.  Patient denies any history of known CHF, diuretic use, history of anxiety, known sick contacts, recent infection with COVID-19.  Patient does not report any visual changes, weakness, tingling, headache, nausea, vomiting,  diarrhea, fever, chills.  Chart review completed.  ECG shows NSR with possible anterior infarct age-indeterminate.  There is a mildly increased PR interval from his prior in 2015, otherwise remains unchanged without any acute ST or T wave changes noted.  As read by me, overread pending.  CXR: Patchy opacities and heart enlargement from last chest x-ray which was completed in 2015, As read by me.  Overread pending.  Concern for cardiomegaly versus CHF versus pneumonia versus PE.  Patient will need to be elevated to a higher level of care in the emergency department for further evaluation.  Patient is stable today in clinic and will be able to travel via private vehicle.  Return as needed.  Of note: Patient became short of breath with chest xray and began sweating.  ASSESSMENT/PLAN:  1. Shortness of breath  2. Orthopnea  3. Intermittent palpitations  4. Chest tightness  5. Swelling of left lower extremity    Plan:   Discharge Instructions      Your current condition warrants further evaluation and/or treatment which exceed services available to you in this urgent care setting. I have discussed with you your currrent condition and the need for further evaluation and/or treatment in an emergency department setting. In response to my medical recommendation, you have opted to go to the emergency department.         Amalia Greenhouse, [place] 03/23/21 [phone]

## 2021-03-25 DIAGNOSIS — I10 Essential (primary) hypertension: Secondary | ICD-10-CM | POA: Insufficient documentation

## 2021-03-25 DIAGNOSIS — R0602 Shortness of breath: Secondary | ICD-10-CM | POA: Insufficient documentation

## 2021-03-27 ENCOUNTER — Ambulatory Visit: Payer: BC Managed Care – PPO | Admitting: Internal Medicine

## 2021-03-27 ENCOUNTER — Encounter: Payer: Self-pay | Admitting: Internal Medicine

## 2021-03-27 ENCOUNTER — Other Ambulatory Visit: Payer: Self-pay

## 2021-03-27 VITALS — BP 142/96 | HR 90 | Temp 96.9°F | Resp 16 | Ht 74.0 in | Wt 376.6 lb

## 2021-03-27 DIAGNOSIS — G479 Sleep disorder, unspecified: Secondary | ICD-10-CM | POA: Diagnosis not present

## 2021-03-27 DIAGNOSIS — U099 Post covid-19 condition, unspecified: Secondary | ICD-10-CM

## 2021-03-27 DIAGNOSIS — R0600 Dyspnea, unspecified: Secondary | ICD-10-CM

## 2021-03-27 NOTE — Patient Instructions (Signed)

## 2021-03-27 NOTE — Progress Notes (Signed)
[facility] [address] [place], [place] [address]  Pulmonary Sleep Medicine   Office Visit Note  Patient Name: Hector Hancock DOB: 04-10-1966 MRN [id number]  Date of Service: 03/30/2021  Complaints/HPI: SOB for 2 weeks. He was seen in the hospital urgent care noted to have bronchial thickening noted. He states he has had covid twice. His cxr shows some GGO findings. He states he has always been a big size. He is able to complete sentences while sitting. He states he has never smoked. He does not drink. Patient states he has gained 30 pounds during the last few months. He has managed to lose some of the weight. No prior history of asthma. He had a CT scan done of his chest back in 2007 which was read as CHF., He states he does have some issues with sleep also. Patient is very active during sleep. He feels at times he snores. He wakes up farily fresh does not nap. He does not have headaches  ROS  General: (-) fever, (-) chills, (-) night sweats, (-) weakness Skin: (-) rashes, (-) itching,. Eyes: (-) visual changes, (-) redness, (-) itching. Nose and Sinuses: (-) nasal stuffiness or itchiness, (-) postnasal drip, (-) nosebleeds, (-) sinus trouble. Mouth and Throat: (-) sore throat, (-) hoarseness. Neck: (-) swollen glands, (-) enlarged thyroid, (-) neck pain. Respiratory: - cough, (-) bloody sputum, + shortness of breath, - wheezing. Cardiovascular: + ankle swelling, (-) chest pain. Lymphatic: (-) lymph node enlargement. Neurologic: (-) numbness, (-) tingling. Psychiatric: (-) anxiety, (-) depression   Current Medication: Outpatient Encounter Medications as of 03/27/2021  Medication Sig   enalapril (VASOTEC) 5 MG tablet Take 1 tablet (5 mg total) by mouth daily.   gabapentin (NEURONTIN) 300 MG capsule TAKE 1 CAP AT BEDTIME FOR 4 DAYS, 1 CAP TWICE A DAY FOR 4 DAYS, 1 CAP THREE TIMES A DAY AS STANDING DOSE UNTIL DIRECTED OTHERWISE   hydroxychloroquine (PLAQUENIL) 200 MG  tablet Take 200 mg by mouth daily.   [DISCONTINUED] NUCYNTA 50 MG tablet Take 1 tablet (50 mg total) by mouth every 6 (six) hours as needed for moderate pain. 1 TO 2 TABS Q 6 HOURS PRN PAIN (Patient not taking: Reported on 03/27/2021)   [DISCONTINUED] ondansetron (ZOFRAN ODT) 8 MG disintegrating tablet Take 1 tablet (8 mg total) by mouth every 6 (six) hours as needed for nausea or vomiting. (Patient not taking: Reported on 03/27/2021)   No facility-administered encounter medications on file as of 03/27/2021.    Surgical History: Past Surgical History:  Procedure Laterality Date   COLONOSCOPY WITH PROPOFOL N/A 12/27/2015   Procedure: COLONOSCOPY WITH PROPOFOL;  Surgeon: Midge Minium, MD;  Location: [facility];  Service: Endoscopy;  Laterality: N/A;   EXTRACORPOREAL SHOCK WAVE LITHOTRIPSY Left 07/28/2018   Procedure: EXTRACORPOREAL SHOCK WAVE LITHOTRIPSY (ESWL);  Surgeon: Orson Ape, MD;  Location: [facility] ORS;  Service: Urology;  Laterality: Left;   EXTRACORPOREAL SHOCK WAVE LITHOTRIPSY Right 01/04/2020   Procedure: EXTRACORPOREAL SHOCK WAVE LITHOTRIPSY (ESWL);  Surgeon: Orson Ape, MD;  Location: [facility] OR[facility];  Service: Urology;  Laterality: Right;   POLYPECTOMY  12/27/2015   Procedure: POLYPECTOMY;  Surgeon: Midge Minium, MD;  Location: [facility];  Service: Endoscopy;;   SACROILIAC JOINT FUSION Right 09/06/2013   Procedure: SACROILIAC JOINT FUSION;  Surgeon: Emilee Hero, MD;  Location: [facility] OR;  Service: Orthopedics;  Laterality: Right;  Right sided sacroiliac joint fusion   TOTAL SHOULDER ARTHROPLASTY Left    WRIST  SURGERY Left     Medical History: Past Medical History:  Diagnosis Date   Arthritis    History of kidney stones    Hypertension     Family History: Family History  Problem Relation Age of Onset   Diabetes Mother    Arthritis Father    Diabetes Sister    Diabetes Sister     Social History: Social History   Socioeconomic History    Marital status: Married    Spouse name: Not on file   Number of children: Not on file   Years of education: Not on file   Highest education level: Not on file  Occupational History   Not on file  Tobacco Use   Smoking status: Never   Smokeless tobacco: Never  Substance and Sexual Activity   Alcohol use: No   Drug use: No   Sexual activity: Not on file  Other Topics Concern   Not on file  Social History Narrative   Not on file   Social Determinants of Health   Financial Resource Strain: Not on file  Food Insecurity: Not on file  Transportation Needs: Not on file  Physical Activity: Not on file  Stress: Not on file  Social Connections: Not on file  Intimate Partner Violence: Not on file    Vital Signs: Blood pressure (!) 142/96, pulse 90, temperature (!) 96.9 F (36.1 C), resp. rate 16, height  (1.88 m), weight (!) 376 lb 9.6 oz (170.8 kg), SpO2 96 %.  Examination: General Appearance: The patient is well-developed, well-nourished, and in no distress. Skin: Gross inspection of skin unremarkable. Head: normocephalic, no gross deformities. Eyes: no gross deformities noted. ENT: ears appear grossly normal no exudates. Neck: Supple. No thyromegaly. No LAD. Respiratory: few rhonchi noted at this time. Cardiovascular: Normal S1 and S2 without murmur or rub. Extremities: No cyanosis. pulses are equal. Neurologic: Alert and oriented. No involuntary movements.  LABS: Recent Results (from the past 2160 hour(s))  Basic metabolic panel     Status: Abnormal   Collection Time: 03/23/21  9:32 AM  Result Value Ref Range   Sodium 139 135 - 145 mmol/L   Potassium 4.0 3.5 - 5.1 mmol/L   Chloride 107 98 - 111 mmol/L   CO2 22 22 - 32 mmol/L   Glucose, Bld 133 (H) 70 - 99 mg/dL    Comment: Glucose reference range applies only to samples taken after fasting for at least 8 hours.   BUN 15 6 - 20 mg/dL   Creatinine, Ser 1.61 0.61 - 1.24 mg/dL   Calcium 8.7 (L) 8.9 - 10.3 mg/dL    GFR, Estimated >09 >60 mL/min    Comment: (NOTE) Calculated using the CKD-EPI Creatinine Equation (2021)    Anion gap 10 5 - 15    Comment: Performed at [facility], [address] [place], [place] [address]  CBC     Status: None   Collection Time: 03/23/21  9:32 AM  Result Value Ref Range   WBC 7.6 4.0 - 10.5 K/uL   RBC 5.27 4.22 - 5.81 MIL/uL   Hemoglobin 16.1 13.0 - 17.0 g/dL   HCT 81.1 91.4 - 78.2 %   MCV 91.1 80.0 - 100.0 fL   MCH 30.6 26.0 - 34.0 pg   MCHC 33.5 30.0 - 36.0 g/dL   RDW 95.6 21.3 - 08.6 %   Platelets 160 150 - 400 K/uL   nRBC 0.0 0.0 - 0.2 %    Comment: Performed at  [facility], [address], [place], [place] [address]  Troponin I (High Sensitivity)     Status: None   Collection Time: 03/23/21  9:32 AM  Result Value Ref Range   Troponin I (High Sensitivity) 12 <18 ng/L    Comment: (NOTE) Elevated high sensitivity troponin I (hsTnI) values and significant  changes across serial measurements may suggest ACS but many other  chronic and acute conditions are known to elevate hsTnI results.  Refer to the "Links" section for chest pain algorithms and additional  guidance. Performed at [facility], [address] [place], [place] [address]   Brain natriuretic peptide     Status: None   Collection Time: 03/23/21  9:32 AM  Result Value Ref Range   B Natriuretic Peptide 32.2 0.0 - 100.0 pg/mL    Comment: Performed at [facility], [address] [place], [place] [address]  Troponin I (High Sensitivity)     Status: None   Collection Time: 03/23/21 11:43 AM  Result Value Ref Range   Troponin I (High Sensitivity) 10 <18 ng/L    Comment: (NOTE) Elevated high sensitivity troponin I (hsTnI) values and significant  changes across serial measurements may suggest ACS but many other  chronic and acute conditions are known to elevate hsTnI results.  Refer to the "Links" section for chest pain algorithms and additional   guidance. Performed at [facility], [address] [place], [place] [address]     Radiology: DG Chest 2 View  Result Date: 03/23/2021 CLINICAL DATA:  Shortness of breath EXAM: CHEST - 2 VIEW COMPARISON:  03/23/2021 a prior studies FINDINGS: UPPER limits normal heart size and mild peribronchial thickening again noted. There is no evidence of focal airspace disease, pulmonary edema, suspicious pulmonary nodule/mass, pleural effusion, or pneumothorax. No acute bony abnormalities are identified. IMPRESSION: Mild peribronchial thickening without evidence of focal pneumonia. Electronically Signed   By: Harmon Pier M.D.   On: 03/23/2021 10:04   DG Chest 2 View  Result Date: 03/23/2021 CLINICAL DATA:  55 year old male with history of shortness of breath and chest tightness. EXAM: CHEST - 2 VIEW COMPARISON:  Chest x-ray 06/18/2014. FINDINGS: Diffuse peribronchial cuffing and patchy areas of interstitial prominence, most evident throughout the right lung, particularly in the right hilar and infrahilar region. No pleural effusions. No pneumothorax. No evidence of pulmonary edema. Heart size is mildly enlarged. Upper mediastinal contours are within normal limits. Atherosclerotic calcifications in the thoracic aorta. IMPRESSION: 1. The appearance the chest is concerning for bronchitis, potentially with developing right-sided bronchopneumonia. Followup PA and lateral chest X-ray is recommended in 3-4 weeks following trial of antibiotic therapy to ensure resolution and exclude underlying malignancy. 2. Mild cardiomegaly. 3. Aortic atherosclerosis. Electronically Signed   By: Trudie Reed M.D.   On: 03/23/2021 09:25    No results found.  DG Chest 2 View  Result Date: 03/23/2021 CLINICAL DATA:  Shortness of breath EXAM: CHEST - 2 VIEW COMPARISON:  03/23/2021 a prior studies FINDINGS: UPPER limits normal heart size and mild peribronchial thickening again noted. There is no evidence of focal airspace  disease, pulmonary edema, suspicious pulmonary nodule/mass, pleural effusion, or pneumothorax. No acute bony abnormalities are identified. IMPRESSION: Mild peribronchial thickening without evidence of focal pneumonia. Electronically Signed   By: Harmon Pier M.D.   On: 03/23/2021 10:04   DG Chest 2 View  Result Date: 03/23/2021 CLINICAL DATA:  55 year old male with history of shortness of breath and chest tightness. EXAM: CHEST -  2 VIEW COMPARISON:  Chest x-ray 06/18/2014. FINDINGS: Diffuse peribronchial cuffing and patchy areas of interstitial prominence, most evident throughout the right lung, particularly in the right hilar and infrahilar region. No pleural effusions. No pneumothorax. No evidence of pulmonary edema. Heart size is mildly enlarged. Upper mediastinal contours are within normal limits. Atherosclerotic calcifications in the thoracic aorta. IMPRESSION: 1. The appearance the chest is concerning for bronchitis, potentially with developing right-sided bronchopneumonia. Followup PA and lateral chest X-ray is recommended in 3-4 weeks following trial of antibiotic therapy to ensure resolution and exclude underlying malignancy. 2. Mild cardiomegaly. 3. Aortic atherosclerosis. Electronically Signed   By: Trudie Reed M.D.   On: 03/23/2021 09:25      Assessment and Plan: Patient Active Problem List   Diagnosis Date Noted   Snoring 02/24/2021   Rheumatoid arthritis involving both hands with positive rheumatoid factor (HCC) 04/19/2020   Class 3 severe obesity due to excess calories with serious comorbidity and body mass index (BMI) of 45.0 to 49.9 in adult [facility]) 04/19/2020   History of nephrolithiasis 04/19/2020   Hypertension    Special screening for malignant neoplasms, colon     1. sob Multifactorial post COVID as well as being obese. - Pulmonary function test; Future - CT Chest High Resolution; Future  2. Obesity, morbid (HCC) Obesity Counseling: Had a lengthy discussion regarding  patients BMI and weight issues. Patient was instructed on portion control as well as increased activity. Also discussed caloric restrictions with trying to maintain intake less than 2000 Kcal. Discussions were made in accordance with the 5As of weight management. Simple actions such as not eating late and if able to, taking a walk is suggested.   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 Sleep disturbance Patient has significant symptoms consideration for snoring excessive sleepiness super morbid obesity.  Patient needs to have sleep study evaluation done to determine whether component of obstructive sleep apnea is present.  Other comorbid conditions include presence of hypertension - PSG Sleep Study; Future   General Counseling: I have discussed the findings of the evaluation and examination with Taishaun.  I have also discussed any further diagnostic evaluation thatmay be needed or ordered today. Loron verbalizes understanding of the findings of todays visit. We also reviewed his medications today and discussed drug interactions and side effects including but not limited excessive drowsiness and altered mental states. We also discussed that there is always a risk not just to him but also people around him. he has been encouraged to call the office with any questions or concerns that should arise related to todays visit.  Orders Placed This Encounter  Procedures   CT Chest High Resolution    Standing Status:   Future    Standing Expiration Date:   03/27/2022    Order Specific Question:   Preferred imaging location?    Answer:   [facility]   Pulmonary function test    Standing Status:   Future    Standing Expiration Date:   03/27/2022    Order Specific Question:   Where should this test be  performed?    Answer:   [facility]   PSG Sleep Study    Standing Status:   Future    Standing Expiration Date:   03/27/2022    Order Specific  Question:   Where should this test be performed:    Answer:   [facility]     Time spent: 75  I have personally obtained a history, examined the patient, evaluated laboratory and imaging results, formulated the assessment and plan and placed orders.    Yevonne Pax, MD [facility] Pulmonary and Critical Care Sleep medicine

## 2021-03-31 ENCOUNTER — Telehealth: Payer: Self-pay

## 2021-03-31 NOTE — Telephone Encounter (Signed)
Patient called needing to know when his sleep study will be scheduled. I gave him tele # [phone] for [facility]-Toni

## 2021-04-01 ENCOUNTER — Telehealth: Payer: Self-pay

## 2021-04-01 ENCOUNTER — Encounter: Payer: Self-pay | Admitting: Nurse Practitioner

## 2021-04-01 ENCOUNTER — Other Ambulatory Visit: Payer: Self-pay

## 2021-04-01 ENCOUNTER — Ambulatory Visit
Admission: RE | Admit: 2021-04-01 | Discharge: 2021-04-01 | Disposition: A | Discharge status: Home / Self Care | Payer: BC Managed Care – PPO | Source: Ambulatory Visit | Attending: Nurse Practitioner | Admitting: Nurse Practitioner

## 2021-04-01 ENCOUNTER — Ambulatory Visit
Admission: RE | Admit: 2021-04-01 | Discharge: 2021-04-01 | Disposition: A | Discharge status: Home / Self Care | Payer: BC Managed Care – PPO | Attending: Nurse Practitioner | Admitting: Nurse Practitioner

## 2021-04-01 ENCOUNTER — Ambulatory Visit: Payer: BC Managed Care – PPO | Admitting: Nurse Practitioner

## 2021-04-01 VITALS — BP 139/95 | HR 89 | Temp 97.3°F | Resp 16 | Ht 74.0 in | Wt 377.8 lb

## 2021-04-01 DIAGNOSIS — R0600 Dyspnea, unspecified: Secondary | ICD-10-CM | POA: Diagnosis not present

## 2021-04-01 DIAGNOSIS — R0602 Shortness of breath: Secondary | ICD-10-CM

## 2021-04-01 DIAGNOSIS — I517 Cardiomegaly: Secondary | ICD-10-CM | POA: Diagnosis not present

## 2021-04-01 DIAGNOSIS — U099 Post covid-19 condition, unspecified: Secondary | ICD-10-CM | POA: Diagnosis not present

## 2021-04-01 DIAGNOSIS — G4733 Obstructive sleep apnea (adult) (pediatric): Secondary | ICD-10-CM | POA: Diagnosis not present

## 2021-04-01 DIAGNOSIS — I10 Essential (primary) hypertension: Secondary | ICD-10-CM | POA: Diagnosis not present

## 2021-04-01 MED ORDER — ALBUTEROL SULFATE HFA 108 (90 BASE) MCG/ACT IN AERS: 2.0000 | INHALATION_SPRAY | Freq: Four times a day (QID) | RESPIRATORY_TRACT | 2 refills | Status: DC | PRN

## 2021-04-01 NOTE — Telephone Encounter (Signed)
Pt called that he like earlier appt for PFT due to he is having SOB tony reschedule him for next week and also he had sleep study tonight advised him to take a deep breath when is feel s sob and take it easy to do any work and also if he is having more go to ED or call us back and make appt

## 2021-04-01 NOTE — Telephone Encounter (Signed)
Sent Community message to Maralyn Sago with [employer] to set up oxygen at 2 liters

## 2021-04-01 NOTE — Telephone Encounter (Signed)
As per dr Welton Flakes advised pt that we can see you today and we can order chest xray

## 2021-04-01 NOTE — Telephone Encounter (Signed)
Patients PSG order has been sent to [facility] on 04/01/2021 for scheduling.

## 2021-04-01 NOTE — Progress Notes (Signed)
[facility] [address] [place], [place] [address]  Internal MEDICINE  Office Visit Note  Patient Name: Hector Hancock  [id number]  [id number]  Date of Service: 04/01/2021  Chief Complaint  Patient presents with   Acute Visit    SOB     HPI Hector Hancock presents for an acute sick visit for shortness of breath. He initially called the clinic reporting shortness of breath and Dr. Freda Munro was notified and requested that he be scheduled to be see today. His PCP is Dr. Rudie Meyer. He is seen at [facility] for pulmonary. He is currently not on any maintenance inhalers and does not have a rescue inhaler prescribed. He is scheduled for a PFT coming up soon. He had marked shortness of breath coming into the exam room for his office visit. He reports getting short of breath with minimal exertion. He gets winded after walking approx 50 steps even at a slow pace. Just walking into the clinic from the car was difficult.  -6 minute walk done in office today, oxygen level dropped to 87% during test so he qualified for home O2, was put on 2LPM nasal cannula after his oxygen level dropped to 87%.  Spirometry was done in office today and the quality was poor. FEV1, FVC and ratio demonstrated moderate obstructive lung disease.    Current Medication:  Outpatient Encounter Medications as of 04/01/2021  Medication Sig   albuterol (VENTOLIN HFA) 108 (90 Base) MCG/ACT inhaler Inhale 2 puffs into the lungs every 6 (six) hours as needed for wheezing or shortness of breath.   enalapril (VASOTEC) 5 MG tablet Take 1 tablet (5 mg total) by mouth daily.   gabapentin (NEURONTIN) 300 MG capsule TAKE 1 CAP AT BEDTIME FOR 4 DAYS, 1 CAP TWICE A DAY FOR 4 DAYS, 1 CAP THREE TIMES A DAY AS STANDING DOSE UNTIL DIRECTED OTHERWISE   hydroxychloroquine (PLAQUENIL) 200 MG tablet Take 200 mg by mouth daily.   No facility-administered encounter medications on file as of 04/01/2021.      Medical History: Past Medical  History:  Diagnosis Date   Arthritis    History of kidney stones    Hypertension      Vital Signs: BP (!) 139/95   Pulse 89   Temp (!) 97.3 F (36.3 C)   Resp 16   Ht 6\' 2"  (1.88 m)   Wt (!) 377 lb 12.8 oz (171.4 kg)   SpO2 95%   BMI 48.51 kg/m    Review of Systems  Constitutional:  Negative for chills, diaphoresis, fatigue and fever.  HENT:  Negative for congestion, postnasal drip, rhinorrhea, sinus pressure, sinus pain, sneezing, sore throat and trouble swallowing.   Respiratory:  Positive for shortness of breath and wheezing. Negative for cough and chest tightness.   Cardiovascular: Negative.  Negative for chest pain and palpitations.  Gastrointestinal: Negative.  Negative for abdominal pain, diarrhea, nausea and vomiting.  Genitourinary: Negative.   Musculoskeletal:  Positive for arthralgias and back pain. Negative for myalgias.  Neurological:  Negative for dizziness, light-headedness and headaches.  Psychiatric/Behavioral:  The patient is nervous/anxious.    Physical Exam Vitals reviewed.  Constitutional:      General: He is not in acute distress.    Appearance: Normal appearance. He is obese. He is ill-appearing.  HENT:     Head: Normocephalic and atraumatic.  Cardiovascular:     Rate and Rhythm: Normal rate and regular rhythm.     Pulses: Normal pulses.  Heart sounds: Normal heart sounds. No murmur heard.   No friction rub. No gallop.  Pulmonary:     Effort: No accessory muscle usage, respiratory distress or retractions.     Breath sounds: Decreased air movement present. Examination of the right-upper field reveals decreased breath sounds. Examination of the left-upper field reveals decreased breath sounds. Examination of the right-middle field reveals decreased breath sounds. Examination of the left-middle field reveals decreased breath sounds. Examination of the right-lower field reveals decreased breath sounds. Examination of the left-lower field reveals  decreased breath sounds. Decreased breath sounds present. No wheezing.     Comments: Increased work of breathing, SOB Neurological:     Mental Status: He is alert.      Assessment/Plan: 1. SOB (shortness of breath) Significant shortness of breath with minimal exertion. 6 minute walk and spirometry done. Qualified for home 02, order sent. Rescue inhaler prescribed. Xray ordered to rule out pneumonia.  - 6 minute walk; Future - Spirometry with Graph - For home use only DME oxygen - DG Chest 2 View; Future - albuterol (VENTOLIN HFA) 108 (90 Base) MCG/ACT inhaler; Inhale 2 puffs into the lungs every 6 (six) hours as needed for wheezing or shortness of breath.  Dispense: 8 g; Refill: 2  2. COVID-19 long hauler Chronic condition, being monitored by Dr. Freda Munro and [facility]. Schedule for PFT soon to determine if the condition is improving.    General Counseling: Aniel verbalizes understanding of the findings of todays visit and agrees with plan of treatment. I have discussed any further diagnostic evaluation that may be needed or ordered today. We also reviewed his medications today. he has been encouraged to call the office with any questions or concerns that should arise related to todays visit.    Counseling:    Orders Placed This Encounter  Procedures   For home use only DME oxygen   DG Chest 2 View   6 minute walk   Spirometry with Graph    Meds ordered this encounter  Medications   albuterol (VENTOLIN HFA) 108 (90 Base) MCG/ACT inhaler    Sig: Inhale 2 puffs into the lungs every 6 (six) hours as needed for wheezing or shortness of breath.    Dispense:  8 g    Refill:  2    Return in about 4 weeks (around 04/29/2021) for F/U, pulmonary only DSK.  [place] Controlled Substance Database was reviewed by me for overdose risk score (ORS)  Time spent:20 Minutes Time spent with patient included reviewing progress notes, labs, imaging studies, and discussing plan for follow  up.   This patient was seen by Sallyanne Kuster, FNP-C in collaboration with Dr. Beverely Risen as a part of collaborative care agreement.  Fumio Vandam R. Tedd Sias, MSN, FNP-C Internal Medicine

## 2021-04-02 ENCOUNTER — Telehealth: Payer: Self-pay

## 2021-04-02 MED ORDER — AZITHROMYCIN 250 MG PO TABS: ORAL_TABLET | ORAL | 0 refills | Status: DC

## 2021-04-02 NOTE — Telephone Encounter (Addendum)
Pt advised that as per dr Lanice Schwab khan   chest xray is no change we will send Zithromax for 5 days and used inhaler as needed and for his oxygen due to his he need to met his deductible I spoke with [employer] they will send finance waver to him and also  advised he if having trouble breathing need to go ED

## 2021-04-02 NOTE — Telephone Encounter (Signed)
Spoke with pt about chest xray result see other note

## 2021-04-03 DIAGNOSIS — I824Z2 Acute embolism and thrombosis of unspecified deep veins of left distal lower extremity: Secondary | ICD-10-CM | POA: Diagnosis not present

## 2021-04-03 DIAGNOSIS — I2699 Other pulmonary embolism without acute cor pulmonale: Secondary | ICD-10-CM | POA: Diagnosis not present

## 2021-04-03 DIAGNOSIS — I10 Essential (primary) hypertension: Secondary | ICD-10-CM | POA: Diagnosis not present

## 2021-04-03 DIAGNOSIS — M7989 Other specified soft tissue disorders: Secondary | ICD-10-CM | POA: Diagnosis not present

## 2021-04-03 DIAGNOSIS — M79662 Pain in left lower leg: Secondary | ICD-10-CM | POA: Diagnosis not present

## 2021-04-03 DIAGNOSIS — I2694 Multiple subsegmental pulmonary emboli without acute cor pulmonale: Secondary | ICD-10-CM | POA: Diagnosis not present

## 2021-04-03 DIAGNOSIS — Z885 Allergy status to narcotic agent status: Secondary | ICD-10-CM | POA: Diagnosis not present

## 2021-04-03 DIAGNOSIS — E669 Obesity, unspecified: Secondary | ICD-10-CM | POA: Diagnosis not present

## 2021-04-03 DIAGNOSIS — R0602 Shortness of breath: Secondary | ICD-10-CM | POA: Diagnosis not present

## 2021-04-03 DIAGNOSIS — Z888 Allergy status to other drugs, medicaments and biological substances status: Secondary | ICD-10-CM | POA: Diagnosis not present

## 2021-04-03 DIAGNOSIS — I82462 Acute embolism and thrombosis of left calf muscular vein: Secondary | ICD-10-CM | POA: Diagnosis not present

## 2021-04-03 DIAGNOSIS — I2602 Saddle embolus of pulmonary artery with acute cor pulmonale: Secondary | ICD-10-CM | POA: Diagnosis not present

## 2021-04-03 DIAGNOSIS — M064 Inflammatory polyarthropathy: Secondary | ICD-10-CM | POA: Diagnosis not present

## 2021-04-03 DIAGNOSIS — I2609 Other pulmonary embolism with acute cor pulmonale: Secondary | ICD-10-CM | POA: Diagnosis not present

## 2021-04-03 DIAGNOSIS — Z79899 Other long term (current) drug therapy: Secondary | ICD-10-CM | POA: Diagnosis not present

## 2021-04-03 DIAGNOSIS — I824Y2 Acute embolism and thrombosis of unspecified deep veins of left proximal lower extremity: Secondary | ICD-10-CM | POA: Diagnosis not present

## 2021-04-03 DIAGNOSIS — I82432 Acute embolism and thrombosis of left popliteal vein: Secondary | ICD-10-CM | POA: Diagnosis not present

## 2021-04-03 DIAGNOSIS — I82402 Acute embolism and thrombosis of unspecified deep veins of left lower extremity: Secondary | ICD-10-CM | POA: Diagnosis not present

## 2021-04-03 DIAGNOSIS — M1380 Other specified arthritis, unspecified site: Secondary | ICD-10-CM | POA: Diagnosis not present

## 2021-04-03 DIAGNOSIS — Z6841 Body Mass Index (BMI) 40.0 and over, adult: Secondary | ICD-10-CM | POA: Diagnosis not present

## 2021-04-03 DIAGNOSIS — Z20822 Contact with and (suspected) exposure to covid-19: Secondary | ICD-10-CM | POA: Diagnosis not present

## 2021-04-03 DIAGNOSIS — Z8616 Personal history of COVID-19: Secondary | ICD-10-CM | POA: Diagnosis not present

## 2021-04-04 ENCOUNTER — Telehealth: Payer: Self-pay

## 2021-04-04 DIAGNOSIS — I2699 Other pulmonary embolism without acute cor pulmonale: Secondary | ICD-10-CM | POA: Insufficient documentation

## 2021-04-04 DIAGNOSIS — R0609 Other forms of dyspnea: Secondary | ICD-10-CM | POA: Insufficient documentation

## 2021-04-04 DIAGNOSIS — M79605 Pain in left leg: Secondary | ICD-10-CM | POA: Insufficient documentation

## 2021-04-04 NOTE — Telephone Encounter (Signed)
PATIENT CALLED TO [facility]. HE IS CURRENTLY IN THE HOSPITAL AND THEY FOUND BLOOD CLOTS IN HIS LUNGS AND HIS LEG.

## 2021-04-07 ENCOUNTER — Telehealth: Payer: Self-pay

## 2021-04-07 NOTE — Telephone Encounter (Signed)
Please see

## 2021-04-08 ENCOUNTER — Telehealth: Payer: Self-pay

## 2021-04-08 NOTE — Telephone Encounter (Signed)
Patient called stating all his appointments were cancelled due to him being in hospital. He will call back at later date to reschedule-Toni

## 2021-04-09 ENCOUNTER — Ambulatory Visit: Payer: BC Managed Care – PPO | Admitting: Internal Medicine

## 2021-04-10 ENCOUNTER — Ambulatory Visit: Payer: BC Managed Care – PPO | Admitting: Internal Medicine

## 2021-04-11 DIAGNOSIS — R0602 Shortness of breath: Secondary | ICD-10-CM | POA: Insufficient documentation

## 2021-04-14 ENCOUNTER — Other Ambulatory Visit: Payer: Self-pay | Admitting: Internal Medicine

## 2021-04-18 DIAGNOSIS — E669 Obesity, unspecified: Secondary | ICD-10-CM | POA: Diagnosis not present

## 2021-04-18 DIAGNOSIS — I2609 Other pulmonary embolism with acute cor pulmonale: Secondary | ICD-10-CM | POA: Diagnosis not present

## 2021-04-18 DIAGNOSIS — Z7901 Long term (current) use of anticoagulants: Secondary | ICD-10-CM | POA: Diagnosis not present

## 2021-04-18 DIAGNOSIS — M064 Inflammatory polyarthropathy: Secondary | ICD-10-CM | POA: Diagnosis not present

## 2021-04-18 DIAGNOSIS — Z6841 Body Mass Index (BMI) 40.0 and over, adult: Secondary | ICD-10-CM | POA: Diagnosis not present

## 2021-04-18 DIAGNOSIS — I10 Essential (primary) hypertension: Secondary | ICD-10-CM | POA: Diagnosis not present

## 2021-04-23 ENCOUNTER — Ambulatory Visit: Payer: BC Managed Care – PPO | Admitting: Internal Medicine

## 2021-04-24 ENCOUNTER — Ambulatory Visit: Payer: BC Managed Care – PPO | Admitting: Internal Medicine

## 2021-04-24 ENCOUNTER — Ambulatory Visit
Admission: RE | Admit: 2021-04-24 | Discharge: 2021-04-24 | Disposition: A | Discharge status: Home / Self Care | Payer: BC Managed Care – PPO | Source: Ambulatory Visit | Attending: Nurse Practitioner | Admitting: Nurse Practitioner

## 2021-04-24 ENCOUNTER — Encounter: Payer: Self-pay | Admitting: Nurse Practitioner

## 2021-04-24 ENCOUNTER — Ambulatory Visit: Payer: BC Managed Care – PPO | Admitting: Nurse Practitioner

## 2021-04-24 ENCOUNTER — Other Ambulatory Visit: Payer: Self-pay

## 2021-04-24 ENCOUNTER — Telehealth: Payer: Self-pay

## 2021-04-24 VITALS — BP 140/86 | HR 88 | Temp 98.4°F | Resp 16 | Ht 74.0 in | Wt 383.4 lb

## 2021-04-24 DIAGNOSIS — I10 Essential (primary) hypertension: Secondary | ICD-10-CM | POA: Diagnosis not present

## 2021-04-24 DIAGNOSIS — R0989 Other specified symptoms and signs involving the circulatory and respiratory systems: Secondary | ICD-10-CM | POA: Diagnosis not present

## 2021-04-24 DIAGNOSIS — M79605 Pain in left leg: Secondary | ICD-10-CM

## 2021-04-24 DIAGNOSIS — R0602 Shortness of breath: Secondary | ICD-10-CM | POA: Diagnosis not present

## 2021-04-24 DIAGNOSIS — R918 Other nonspecific abnormal finding of lung field: Secondary | ICD-10-CM | POA: Diagnosis not present

## 2021-04-24 DIAGNOSIS — R079 Chest pain, unspecified: Secondary | ICD-10-CM | POA: Diagnosis not present

## 2021-04-24 DIAGNOSIS — Z885 Allergy status to narcotic agent status: Secondary | ICD-10-CM | POA: Diagnosis not present

## 2021-04-24 DIAGNOSIS — Z20822 Contact with and (suspected) exposure to covid-19: Secondary | ICD-10-CM | POA: Diagnosis not present

## 2021-04-24 DIAGNOSIS — L03116 Cellulitis of left lower limb: Secondary | ICD-10-CM | POA: Insufficient documentation

## 2021-04-24 DIAGNOSIS — I82412 Acute embolism and thrombosis of left femoral vein: Secondary | ICD-10-CM | POA: Diagnosis not present

## 2021-04-24 DIAGNOSIS — R109 Unspecified abdominal pain: Secondary | ICD-10-CM | POA: Diagnosis not present

## 2021-04-24 DIAGNOSIS — Z86718 Personal history of other venous thrombosis and embolism: Secondary | ICD-10-CM | POA: Diagnosis not present

## 2021-04-24 DIAGNOSIS — Z7689 Persons encountering health services in other specified circumstances: Secondary | ICD-10-CM

## 2021-04-24 DIAGNOSIS — I2699 Other pulmonary embolism without acute cor pulmonale: Secondary | ICD-10-CM | POA: Diagnosis not present

## 2021-04-24 DIAGNOSIS — Z6841 Body Mass Index (BMI) 40.0 and over, adult: Secondary | ICD-10-CM

## 2021-04-24 DIAGNOSIS — Z7901 Long term (current) use of anticoagulants: Secondary | ICD-10-CM | POA: Diagnosis not present

## 2021-04-24 DIAGNOSIS — M7989 Other specified soft tissue disorders: Secondary | ICD-10-CM | POA: Diagnosis not present

## 2021-04-24 DIAGNOSIS — I82432 Acute embolism and thrombosis of left popliteal vein: Secondary | ICD-10-CM | POA: Diagnosis not present

## 2021-04-24 DIAGNOSIS — I871 Compression of vein: Secondary | ICD-10-CM | POA: Diagnosis not present

## 2021-04-24 DIAGNOSIS — K760 Fatty (change of) liver, not elsewhere classified: Secondary | ICD-10-CM | POA: Diagnosis not present

## 2021-04-24 DIAGNOSIS — M79662 Pain in left lower leg: Secondary | ICD-10-CM | POA: Diagnosis not present

## 2021-04-24 DIAGNOSIS — M1990 Unspecified osteoarthritis, unspecified site: Secondary | ICD-10-CM | POA: Diagnosis not present

## 2021-04-24 DIAGNOSIS — R7301 Impaired fasting glucose: Secondary | ICD-10-CM

## 2021-04-24 DIAGNOSIS — N200 Calculus of kidney: Secondary | ICD-10-CM | POA: Diagnosis not present

## 2021-04-24 DIAGNOSIS — Z86711 Personal history of pulmonary embolism: Secondary | ICD-10-CM | POA: Diagnosis not present

## 2021-04-24 MED ORDER — SULFAMETHOXAZOLE-TRIMETHOPRIM 800-160 MG PO TABS: 1.0000 | ORAL_TABLET | Freq: Two times a day (BID) | ORAL | 0 refills | Status: AC

## 2021-04-24 NOTE — Telephone Encounter (Signed)
Spoke with Ethelene Browns w/ [facility] scheduling for stat u/s. Patient scheduled @ [facility] @ 1:00 with 12:45 arrival time. Patient advised-Toni

## 2021-04-24 NOTE — Progress Notes (Signed)
[facility] [place], [place] [address]  Internal MEDICINE  Office Visit Note  Patient Name: Hector Hancock  [id number]  [id number]  Date of Service: 04/24/2021   Complaints/HPI Pt is here for establishment of PCP. Chief Complaint  Patient presents with   New Patient (Initial Visit)    Left leg swelling, redness, blood clots   HPI Zyden presents for a new patient visit to establish care for PCP. He was already established for pulmonary care only with Dr. Devona Konig at [facility].  He has a history of inflammatory polyarthritis, hypertension, kidney stones and obesity. He has a recent history of bilateral pulmonary embolism and left lower extremity DVT of the left popliteal vein which he was hospitalized for at [facility]. He is following up with [facility] hematology DVT clinic. 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 He was denied his DOT card due to his recent hospitalization and will need follow up with Dr. 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 He is a nonsmoker, denies alcohol use and denies recreation drug use. His colonoscopy was done in 2017, his tetanus vaccine was done in 2015.   His previous office visit was on 04/01/21 for SOB. He was admitted to [facility] in [place] on 04/04/21 for DVT and pulmonary embolism and was treated with intravenous heparin, had a cardiac catheterization and was discharged on Eliquis. When he was admitted, his left leg was very swollen and discolored purple. Today his left leg is swollen with a large patch of erythema on the lower anterior medial area. He reports that it hurts to walk on the left leg, it is throbbing especially in the calf and he feels "electric shocks", Shooting pains. He states that the redness covers a larger area than when he was in the hospital last month but his leg is not as swollen. They did find large blood  clots in the left lower leg when he was hospitalized.     Current Medication: Outpatient Encounter Medications as of 04/24/2021  Medication Sig   ELIQUIS 5 MG TABS tablet Take by mouth.   gabapentin (NEURONTIN) 300 MG capsule TAKE 1 CAP AT BEDTIME FOR 4 DAYS, 1 CAP TWICE A DAY FOR 4 DAYS, 1 CAP THREE TIMES A DAY AS STANDING DOSE UNTIL DIRECTED OTHERWISE   hydroxychloroquine (PLAQUENIL) 200 MG tablet Take 200 mg by mouth daily.   sulfamethoxazole-trimethoprim (BACTRIM DS) 800-160 MG tablet Take 1 tablet by mouth 2 (two) times daily for 14 days.   [DISCONTINUED] albuterol (VENTOLIN HFA) 108 (90 Base) MCG/ACT inhaler Inhale 2 puffs into the lungs every 6 (six) hours as needed for wheezing or shortness of breath. (Patient not taking: Reported on 04/24/2021)   [DISCONTINUED] azithromycin (ZITHROMAX) 250 MG tablet Use as directed for 5 days (Patient not taking: Reported on 04/24/2021)   [DISCONTINUED] enalapril (VASOTEC) 5 MG tablet Take 1 tablet (5 mg total) by mouth daily. (Patient not taking: Reported on 04/24/2021)   No facility-administered encounter medications on file as of 04/24/2021.    Surgical History: Past Surgical History:  Procedure Laterality Date   COLONOSCOPY WITH PROPOFOL N/A 12/27/2015   Procedure: COLONOSCOPY WITH PROPOFOL;  Surgeon: Lucilla Lame, MD;  Location: [facility];  Service: Endoscopy;  Laterality: N/A;   EXTRACORPOREAL SHOCK WAVE LITHOTRIPSY Left 07/28/2018   Procedure: EXTRACORPOREAL SHOCK WAVE LITHOTRIPSY (ESWL);  Surgeon:  Royston Cowper, MD;  Location: [facility];  Service: Urology;  Laterality: Left;   EXTRACORPOREAL SHOCK WAVE LITHOTRIPSY Right 01/04/2020   Procedure: EXTRACORPOREAL SHOCK WAVE LITHOTRIPSY (ESWL);  Surgeon: Royston Cowper, MD;  Location: [facility];  Service: Urology;  Laterality: Right;   POLYPECTOMY  12/27/2015   Procedure: POLYPECTOMY;  Surgeon: Lucilla Lame, MD;  Location: [facility];  Service: Endoscopy;;   SACROILIAC JOINT FUSION  Right 09/06/2013   Procedure: SACROILIAC JOINT FUSION;  Surgeon: Sinclair Ship, MD;  Location: [place];  Service: Orthopedics;  Laterality: Right;  Right sided sacroiliac joint fusion   TOTAL SHOULDER ARTHROPLASTY Left    WRIST SURGERY Left     Medical History: Past Medical History:  Diagnosis Date   Arthritis    History of kidney stones    Hypertension     Family History: Family History  Problem Relation Age of Onset   Diabetes Mother    Arthritis Father    Diabetes Sister    Diabetes Sister     Social History   Socioeconomic History   Marital status: Married    Spouse name: Not on file   Number of children: Not on file   Years of education: Not on file   Highest education level: Not on file  Occupational History   Not on file  Tobacco Use   Smoking status: Never   Smokeless tobacco: Never  Substance and Sexual Activity   Alcohol use: No   Drug use: No   Sexual activity: Not on file  Other Topics Concern   Not on file  Social History Narrative   Not on file   Social Determinants of Health   Financial Resource Strain: Not on file  Food Insecurity: Not on file  Transportation Needs: Not on file  Physical Activity: Not on file  Stress: Not on file  Social Connections: Not on file  Intimate Partner Violence: Not on file     Review of Systems  Constitutional:  Positive for fatigue.  HENT: Negative.    Respiratory:  Positive for shortness of breath (residual, patient was told to expect to still have some SOB after his hospitalization for blood clots.). Negative for cough, chest tightness and wheezing.   Cardiovascular: Negative.  Negative for chest pain and palpitations.  Gastrointestinal: Negative.  Negative for abdominal pain, constipation, diarrhea, nausea and vomiting.  Musculoskeletal:        Left lower leg pain and swelling.  Neurological:  Negative for dizziness, light-headedness and headaches.  Psychiatric/Behavioral: Negative.     Vital  Signs: BP 140/86 Comment: 140/88  Pulse 88   Temp 98.4 F (36.9 C)   Resp 16   Ht $R'6\' 2"'TB$  (1.88 m)   Wt (!) 383 lb 6.4 oz (173.9 kg)   SpO2 96%   BMI 49.23 kg/m    Physical Exam Vitals reviewed.  Constitutional:      General: He is not in acute distress.    Appearance: Normal appearance. He is obese. He is ill-appearing (left lower leg). He is not toxic-appearing or diaphoretic.  HENT:     Head: Normocephalic and atraumatic.  Eyes:     Extraocular Movements: Extraocular movements intact.     Pupils: Pupils are equal, round, and reactive to light.  Cardiovascular:     Rate and Rhythm: Normal rate and regular rhythm.  Pulmonary:     Effort: Pulmonary effort is normal. No respiratory distress.  Musculoskeletal:     Left lower leg: 2+ Pitting  Edema present.  Skin:    General: Skin is warm and dry.     Capillary Refill: Capillary refill takes less than 2 seconds.     Findings: Erythema (redness and swelling of left lower leg) present.  Neurological:     Mental Status: He is alert and oriented to person, place, and time.     Cranial Nerves: No cranial nerve deficit.     Coordination: Coordination normal.     Gait: Gait abnormal (limping at times due to pain in left lower leg).  Psychiatric:        Mood and Affect: Mood normal.        Behavior: Behavior normal.      Assessment/Plan: 1. Acute pain of left lower extremity Venous ultrasound ordered stat due to presenting features and recent hospitalization for DVT and bilateral PE. Symptoms are similar to that previous episode and worsening, rule out new DVT. Patient is already on blood thinner.  - US Venous Img Lower Unilateral Left (DVT); Future  2. Cellulitis of left lower extremity Cannot rule out acute cellulitis due to patient presentation, empiric antibiotic treatment prescribed  - sulfamethoxazole-trimethoprim (BACTRIM DS) 800-160 MG tablet; Take 1 tablet by mouth 2 (two) times daily for 14 days.  Dispense: 28 tablet;  Refill: 0 - US Venous Img Lower Unilateral Left (DVT); Future - CBC with Differential/Platelet  3. Pulmonary embolism, bilateral ([place]) Recent hospitalization for bilateral pulmonary embolism s/p mechanical embolectomy. According to the hospital records, there are still small residual clots that were unable to be removed. Patient was informed at discharge that  - CBC with Differential/Platelet  4. History of deep vein thrombosis (DVT) of lower extremity Recent hospitalization for DVT of left lower extremity, on Eliquis now.   5. Class 3 severe obesity due to excess calories with serious comorbidity and body mass index (BMI) of 45.0 to 49.9 in adult [facility]) Patient has severe obesity, BMI 49. Last lipid panel was checked in 2015.  - Lipid Profile  6. Impaired fasting glucose Previous A1C was borderline prediabetic at 5.6, has had multiple elevated glucose levels on past labs, thyroid levels have never been checked. Labs ordered, will discuss at his annual wellness visit.  - CMP14+EGFR - TSH + free T4 - Hemoglobin A1c  7. Encounter to establish care with new doctor Patient presents to establish primary care provider. After recent hospitalization, he would prefer to have his specialist and PCP under the same roof. He is overdue for CPE so will schedule office visit for CPE after his follow up next week. Labs ordered for him to have drawn prior to his CPE appointment. He had his colonoscopy in 2017. He may need prostate cancer screening with labs.   Today's visit was complicated, complex and involved high level thinking and decision making     General Counseling: Dillon verbalizes understanding of the findings of todays visit and agrees with plan of treatment. I have discussed any further diagnostic evaluation that may be needed or ordered today. We also reviewed his medications today. he has been encouraged to call the office with any questions or concerns that should arise related to todays  visit.    Counseling:  [place] Controlled Substance Database was reviewed by me.  Orders Placed This Encounter  Procedures   US Venous Img Lower Unilateral Left (DVT)   CBC with Differential/Platelet   CMP14+EGFR   Lipid Profile   TSH + free T4   Hemoglobin A1c    Meds ordered this encounter  Medications   sulfamethoxazole-trimethoprim (BACTRIM DS) 800-160 MG tablet    Sig: Take 1 tablet by mouth 2 (two) times daily for 14 days.    Dispense:  28 tablet    Refill:  0    Return in about 1 week (around 05/01/2021) for F/U, Kassia Demarinis PCP for cellulitis/DVT.  Time spent:30 Minutes  This patient was seen by Jonetta Osgood, FNP-C in collaboration with Dr. Clayborn Bigness as a part of collaborative care agreement.    Teniya Filter R. Valetta Fuller, MSN, FNP-C Internal Medicine

## 2021-04-24 NOTE — Progress Notes (Signed)
Results called to provider by radiology tech. New acute DVT involving the left mid and distal femoral veins, popliteal vein and poor visualization of the lower calf veins consistent with thrombus, current imaging compared to imaging from August 2022. Discussed results with the patient, recommended patient go to ER. Was previously admitted to [facility] medical center and would prefer to go there so a disc with imaging and report was requested for the patient to take with him. Patient is agreeable to the plan and his wife will transport him to [facility] medical center emergency department.

## 2021-04-25 DIAGNOSIS — R109 Unspecified abdominal pain: Secondary | ICD-10-CM | POA: Diagnosis not present

## 2021-04-25 DIAGNOSIS — K760 Fatty (change of) liver, not elsewhere classified: Secondary | ICD-10-CM | POA: Diagnosis not present

## 2021-04-25 DIAGNOSIS — M7989 Other specified soft tissue disorders: Secondary | ICD-10-CM | POA: Diagnosis not present

## 2021-04-25 DIAGNOSIS — I871 Compression of vein: Secondary | ICD-10-CM | POA: Diagnosis not present

## 2021-04-25 DIAGNOSIS — R0602 Shortness of breath: Secondary | ICD-10-CM | POA: Diagnosis not present

## 2021-04-25 DIAGNOSIS — N200 Calculus of kidney: Secondary | ICD-10-CM | POA: Diagnosis not present

## 2021-04-25 DIAGNOSIS — Z86711 Personal history of pulmonary embolism: Secondary | ICD-10-CM | POA: Diagnosis not present

## 2021-04-29 ENCOUNTER — Encounter: Payer: Self-pay | Admitting: Nurse Practitioner

## 2021-04-29 ENCOUNTER — Ambulatory Visit: Payer: BC Managed Care – PPO | Admitting: Nurse Practitioner

## 2021-04-29 ENCOUNTER — Other Ambulatory Visit: Payer: Self-pay

## 2021-04-29 VITALS — BP 112/90 | HR 90 | Temp 98.4°F | Resp 16 | Ht 74.0 in | Wt 381.8 lb

## 2021-04-29 DIAGNOSIS — I82492 Acute embolism and thrombosis of other specified deep vein of left lower extremity: Secondary | ICD-10-CM | POA: Diagnosis not present

## 2021-04-29 DIAGNOSIS — L03116 Cellulitis of left lower limb: Secondary | ICD-10-CM | POA: Diagnosis not present

## 2021-04-29 DIAGNOSIS — I2699 Other pulmonary embolism without acute cor pulmonale: Secondary | ICD-10-CM

## 2021-04-29 NOTE — Progress Notes (Signed)
[facility] [address] [place], [place] [address]  Internal MEDICINE  Office Visit Note  Patient Name: Hector Hancock  [id number]  [id number]  Date of Service: 04/29/2021  Chief Complaint  Patient presents with   Follow-up    cellulitis/DVT   Hypertension    HPI [place] presents for a follow up visit for cellulitis and DVT left leg. At his previous visit, he was a new PCP patient establishing care with new PCP and had a large area of possible cellulitis after a recent hospitalization for bilateral pulmonary embolism and extensive DVT of the left leg. Last week, he had a stat vascular ultrasound of the left leg and it showed worsening blood clot in that leg. He was strongly encouraged to go to the hospital which he did after calling the [facility] DVT clinic and discussing it with them. They agreed with his PCP's instructions. At his previous office visit, he was also prescribed empiric antibiotic treatment for cellulitis with Bactrim DS and is currently taking the medication now. While he went to the hospital on Friday evening, his wife was also transported to the hospital because she was having a stroke.  His visit to the ER confirmed that the DVT in his left leg was still there but [facility] said it was not any worse than when he was hospitalized in august. He was also encouraged by the [facility] hospitalists to take the prescribed antibiotic for cellulitis that his PCP ordered as the redness and inflammation and swelling is most likely a developing cellulitis.    Current Medication: Outpatient Encounter Medications as of 04/29/2021  Medication Sig   ELIQUIS 5 MG TABS tablet Take by mouth.   gabapentin (NEURONTIN) 300 MG capsule TAKE 1 CAP AT BEDTIME FOR 4 DAYS, 1 CAP TWICE A DAY FOR 4 DAYS, 1 CAP THREE TIMES A DAY AS STANDING DOSE UNTIL DIRECTED OTHERWISE   hydroxychloroquine (PLAQUENIL) 200 MG tablet Take 200 mg by mouth daily.   [EXPIRED] sulfamethoxazole-trimethoprim (BACTRIM DS) 800-160 MG  tablet Take 1 tablet by mouth 2 (two) times daily for 14 days.   No facility-administered encounter medications on file as of 04/29/2021.    Surgical History: Past Surgical History:  Procedure Laterality Date   COLONOSCOPY WITH PROPOFOL N/A 12/27/2015   Procedure: COLONOSCOPY WITH PROPOFOL;  Surgeon: Midge Minium, MD;  Location: [facility];  Service: Endoscopy;  Laterality: N/A;   EXTRACORPOREAL SHOCK WAVE LITHOTRIPSY Left 07/28/2018   Procedure: EXTRACORPOREAL SHOCK WAVE LITHOTRIPSY (ESWL);  Surgeon: Orson Ape, MD;  Location: [facility];  Service: Urology;  Laterality: Left;   EXTRACORPOREAL SHOCK WAVE LITHOTRIPSY Right 01/04/2020   Procedure: EXTRACORPOREAL SHOCK WAVE LITHOTRIPSY (ESWL);  Surgeon: Orson Ape, MD;  Location: [facility];  Service: Urology;  Laterality: Right;   POLYPECTOMY  12/27/2015   Procedure: POLYPECTOMY;  Surgeon: Midge Minium, MD;  Location: [facility];  Service: Endoscopy;;   SACROILIAC JOINT FUSION Right 09/06/2013   Procedure: SACROILIAC JOINT FUSION;  Surgeon: Emilee Hero, MD;  Location: [facility];  Service: Orthopedics;  Laterality: Right;  Right sided sacroiliac joint fusion   TOTAL SHOULDER ARTHROPLASTY Left    WRIST SURGERY Left     Medical History: Past Medical History:  Diagnosis Date   Arthritis    History of kidney stones    Hypertension     Family History: Family History  Problem Relation Age of Onset   Diabetes Mother    Arthritis Father    Diabetes Sister  Diabetes Sister     Social History   Socioeconomic History   Marital status: Married    Spouse name: Not on file   Number of children: Not on file   Years of education: Not on file   Highest education level: Not on file  Occupational History   Not on file  Tobacco Use   Smoking status: Never   Smokeless tobacco: Never  Substance and Sexual Activity   Alcohol use: No   Drug use: No   Sexual activity: Not on file  Other Topics Concern   Not  on file  Social History Narrative   Not on file   Social Determinants of Health   Financial Resource Strain: Not on file  Food Insecurity: Not on file  Transportation Needs: Not on file  Physical Activity: Not on file  Stress: Not on file  Social Connections: Not on file  Intimate Partner Violence: Not on file      Review of Systems  Constitutional:  Positive for fatigue.  HENT: Negative.    Respiratory:  Positive for shortness of breath (residual, patient was told to expect to still have some SOB after his hospitalization for blood clots.). Negative for cough, chest tightness and wheezing.   Cardiovascular: Negative.  Negative for chest pain and palpitations.  Gastrointestinal: Negative.  Negative for abdominal pain, constipation, diarrhea, nausea and vomiting.  Musculoskeletal:        Left lower leg pain and swelling, has been improving since starting antibiotic  Neurological:  Negative for dizziness, light-headedness and headaches.  Psychiatric/Behavioral: Negative.     Vital Signs: BP 112/90   Pulse 90   Temp 98.4 F (36.9 C)   Resp 16   Ht 6\' 2"  (1.88 m)   Wt (!) 381 lb 12.8 oz (173.2 kg)   SpO2 98%   BMI 49.02 kg/m    Physical Exam Vitals reviewed.  Constitutional:      General: He is not in acute distress.    Appearance: Normal appearance. He is obese. He is not ill-appearing (left lower leg), toxic-appearing or diaphoretic.  HENT:     Head: Normocephalic and atraumatic.  Eyes:     Extraocular Movements: Extraocular movements intact.     Pupils: Pupils are equal, round, and reactive to light.  Cardiovascular:     Rate and Rhythm: Normal rate and regular rhythm.  Pulmonary:     Effort: Pulmonary effort is normal. No respiratory distress.  Musculoskeletal:     Left lower leg: 2+ Pitting Edema (improving) present.  Skin:    General: Skin is warm and dry.     Capillary Refill: Capillary refill takes less than 2 seconds.     Findings: Erythema (left lower  leg: improving) present.  Neurological:     Mental Status: He is alert and oriented to person, place, and time.     Cranial Nerves: No cranial nerve deficit.     Coordination: Coordination normal.     Gait: Gait (limping at times due to pain in left lower leg) normal.  Psychiatric:        Mood and Affect: Mood normal.        Behavior: Behavior normal.     Assessment/Plan: 1. Cellulitis of left lower extremity Complete course of Bactrim DS, take all doses until gone. Visual signs of improvement noted on today's exam.   2. Acute deep vein thrombosis (DVT) of other specified vein of left lower extremity (HCC) According to imaging and [facility] hospital encounter, clot  is located in left calf veins, popliteal vein, and proximal and distal femoral vein. Continue eliquis as prescribed and follow up with [facility] DVT clinic as instructed.   3. Pulmonary embolism, bilateral (HCC) Hospitalized in august for this problem, continue eliquis as prescribed. Patient is aware that residual intermittent shortness of breath is to be expected.    General Counseling: Flemon verbalizes understanding of the findings of todays visit and agrees with plan of treatment. I have discussed any further diagnostic evaluation that may be needed or ordered today. We also reviewed his medications today. he has been encouraged to call the office with any questions or concerns that should arise related to todays visit.    No orders of the defined types were placed in this encounter.   No orders of the defined types were placed in this encounter.   Return in about 7 weeks (around 06/17/2021) for CPE and DOT physical, need 6 min walk repeated, Prisilla Kocsis PCP.   Total time spent:20 Minutes Time spent includes review of chart, medications, test results, and follow up plan with the patient.   [place] Controlled Substance Database was reviewed by me.  This patient was seen by Sallyanne Kuster, FNP-C in collaboration with Dr. Beverely Risen  as a part of collaborative care agreement.   Sharel Behne R. Tedd Sias, MSN, FNP-C Internal medicine

## 2021-05-08 ENCOUNTER — Ambulatory Visit: Payer: BC Managed Care – PPO | Admitting: Internal Medicine

## 2021-05-11 ENCOUNTER — Encounter: Payer: Self-pay | Admitting: Nurse Practitioner

## 2021-05-14 ENCOUNTER — Telehealth: Payer: Self-pay

## 2021-05-14 NOTE — Telephone Encounter (Signed)
Patients order for a ct is being canceled due to the patient having a CTA CHEST W CONTRAST being done on the 04/04/2021 and on 04/25/2021. These were preformed at [facility].

## 2021-05-19 DIAGNOSIS — H538 Other visual disturbances: Secondary | ICD-10-CM | POA: Diagnosis not present

## 2021-05-19 DIAGNOSIS — R0602 Shortness of breath: Secondary | ICD-10-CM | POA: Diagnosis not present

## 2021-05-19 DIAGNOSIS — I10 Essential (primary) hypertension: Secondary | ICD-10-CM | POA: Diagnosis not present

## 2021-05-19 DIAGNOSIS — R9431 Abnormal electrocardiogram [ECG] [EKG]: Secondary | ICD-10-CM | POA: Diagnosis not present

## 2021-05-19 DIAGNOSIS — R197 Diarrhea, unspecified: Secondary | ICD-10-CM | POA: Diagnosis not present

## 2021-05-19 DIAGNOSIS — R0600 Dyspnea, unspecified: Secondary | ICD-10-CM | POA: Diagnosis not present

## 2021-05-19 DIAGNOSIS — R918 Other nonspecific abnormal finding of lung field: Secondary | ICD-10-CM | POA: Diagnosis not present

## 2021-05-19 DIAGNOSIS — K760 Fatty (change of) liver, not elsewhere classified: Secondary | ICD-10-CM | POA: Diagnosis not present

## 2021-05-19 DIAGNOSIS — J811 Chronic pulmonary edema: Secondary | ICD-10-CM | POA: Diagnosis not present

## 2021-05-19 DIAGNOSIS — Z7901 Long term (current) use of anticoagulants: Secondary | ICD-10-CM | POA: Diagnosis not present

## 2021-05-19 DIAGNOSIS — Z20822 Contact with and (suspected) exposure to covid-19: Secondary | ICD-10-CM | POA: Diagnosis not present

## 2021-05-19 DIAGNOSIS — Z86718 Personal history of other venous thrombosis and embolism: Secondary | ICD-10-CM | POA: Diagnosis not present

## 2021-05-19 DIAGNOSIS — Z86711 Personal history of pulmonary embolism: Secondary | ICD-10-CM | POA: Diagnosis not present

## 2021-05-19 DIAGNOSIS — M7989 Other specified soft tissue disorders: Secondary | ICD-10-CM | POA: Diagnosis not present

## 2021-05-19 DIAGNOSIS — H547 Unspecified visual loss: Secondary | ICD-10-CM | POA: Diagnosis not present

## 2021-05-19 DIAGNOSIS — Z888 Allergy status to other drugs, medicaments and biological substances status: Secondary | ICD-10-CM | POA: Diagnosis not present

## 2021-05-19 DIAGNOSIS — R519 Headache, unspecified: Secondary | ICD-10-CM | POA: Diagnosis not present

## 2021-05-19 DIAGNOSIS — Z885 Allergy status to narcotic agent status: Secondary | ICD-10-CM | POA: Diagnosis not present

## 2021-05-21 DIAGNOSIS — R079 Chest pain, unspecified: Secondary | ICD-10-CM | POA: Diagnosis not present

## 2021-05-21 DIAGNOSIS — R0602 Shortness of breath: Secondary | ICD-10-CM | POA: Diagnosis not present

## 2021-05-21 DIAGNOSIS — J811 Chronic pulmonary edema: Secondary | ICD-10-CM | POA: Diagnosis not present

## 2021-05-21 DIAGNOSIS — I10 Essential (primary) hypertension: Secondary | ICD-10-CM | POA: Diagnosis not present

## 2021-05-21 DIAGNOSIS — R600 Localized edema: Secondary | ICD-10-CM | POA: Diagnosis not present

## 2021-05-21 DIAGNOSIS — R0789 Other chest pain: Secondary | ICD-10-CM | POA: Diagnosis not present

## 2021-05-21 DIAGNOSIS — R2242 Localized swelling, mass and lump, left lower limb: Secondary | ICD-10-CM | POA: Diagnosis not present

## 2021-05-21 DIAGNOSIS — I110 Hypertensive heart disease with heart failure: Secondary | ICD-10-CM | POA: Diagnosis not present

## 2021-05-21 DIAGNOSIS — M064 Inflammatory polyarthropathy: Secondary | ICD-10-CM | POA: Diagnosis not present

## 2021-05-21 DIAGNOSIS — Z6841 Body Mass Index (BMI) 40.0 and over, adult: Secondary | ICD-10-CM | POA: Diagnosis not present

## 2021-05-22 ENCOUNTER — Ambulatory Visit: Payer: BC Managed Care – PPO | Admitting: Internal Medicine

## 2021-05-22 DIAGNOSIS — R0602 Shortness of breath: Secondary | ICD-10-CM | POA: Diagnosis not present

## 2021-05-22 DIAGNOSIS — M064 Inflammatory polyarthropathy: Secondary | ICD-10-CM | POA: Diagnosis not present

## 2021-05-22 DIAGNOSIS — I110 Hypertensive heart disease with heart failure: Secondary | ICD-10-CM | POA: Diagnosis not present

## 2021-05-22 DIAGNOSIS — I5020 Unspecified systolic (congestive) heart failure: Secondary | ICD-10-CM | POA: Diagnosis not present

## 2021-05-22 DIAGNOSIS — R0600 Dyspnea, unspecified: Secondary | ICD-10-CM | POA: Diagnosis not present

## 2021-05-23 DIAGNOSIS — I10 Essential (primary) hypertension: Secondary | ICD-10-CM | POA: Diagnosis not present

## 2021-05-23 DIAGNOSIS — I110 Hypertensive heart disease with heart failure: Secondary | ICD-10-CM | POA: Diagnosis not present

## 2021-05-23 DIAGNOSIS — R0609 Other forms of dyspnea: Secondary | ICD-10-CM | POA: Diagnosis not present

## 2021-05-23 DIAGNOSIS — G4733 Obstructive sleep apnea (adult) (pediatric): Secondary | ICD-10-CM | POA: Diagnosis not present

## 2021-05-23 DIAGNOSIS — I5020 Unspecified systolic (congestive) heart failure: Secondary | ICD-10-CM | POA: Diagnosis not present

## 2021-05-23 DIAGNOSIS — Z9981 Dependence on supplemental oxygen: Secondary | ICD-10-CM | POA: Diagnosis not present

## 2021-05-25 DIAGNOSIS — I5020 Unspecified systolic (congestive) heart failure: Secondary | ICD-10-CM | POA: Diagnosis not present

## 2021-05-25 DIAGNOSIS — I82502 Chronic embolism and thrombosis of unspecified deep veins of left lower extremity: Secondary | ICD-10-CM | POA: Diagnosis not present

## 2021-05-25 DIAGNOSIS — R0609 Other forms of dyspnea: Secondary | ICD-10-CM | POA: Diagnosis not present

## 2021-05-25 DIAGNOSIS — I110 Hypertensive heart disease with heart failure: Secondary | ICD-10-CM | POA: Diagnosis not present

## 2021-05-26 DIAGNOSIS — R0609 Other forms of dyspnea: Secondary | ICD-10-CM | POA: Diagnosis not present

## 2021-05-27 DIAGNOSIS — R600 Localized edema: Secondary | ICD-10-CM | POA: Diagnosis not present

## 2021-05-27 DIAGNOSIS — Z9989 Dependence on other enabling machines and devices: Secondary | ICD-10-CM | POA: Diagnosis not present

## 2021-05-27 DIAGNOSIS — R0609 Other forms of dyspnea: Secondary | ICD-10-CM | POA: Diagnosis not present

## 2021-05-27 DIAGNOSIS — G4733 Obstructive sleep apnea (adult) (pediatric): Secondary | ICD-10-CM | POA: Diagnosis not present

## 2021-05-29 DIAGNOSIS — G4733 Obstructive sleep apnea (adult) (pediatric): Secondary | ICD-10-CM | POA: Diagnosis not present

## 2021-05-29 DIAGNOSIS — I5031 Acute diastolic (congestive) heart failure: Secondary | ICD-10-CM | POA: Diagnosis not present

## 2021-05-29 DIAGNOSIS — R0609 Other forms of dyspnea: Secondary | ICD-10-CM | POA: Diagnosis not present

## 2021-05-29 DIAGNOSIS — I110 Hypertensive heart disease with heart failure: Secondary | ICD-10-CM | POA: Diagnosis not present

## 2021-05-29 DIAGNOSIS — R600 Localized edema: Secondary | ICD-10-CM | POA: Diagnosis not present

## 2021-05-29 DIAGNOSIS — I5030 Unspecified diastolic (congestive) heart failure: Secondary | ICD-10-CM | POA: Diagnosis not present

## 2021-05-30 DIAGNOSIS — I82402 Acute embolism and thrombosis of unspecified deep veins of left lower extremity: Secondary | ICD-10-CM | POA: Insufficient documentation

## 2021-05-30 DIAGNOSIS — I509 Heart failure, unspecified: Secondary | ICD-10-CM | POA: Insufficient documentation

## 2021-05-30 DIAGNOSIS — I110 Hypertensive heart disease with heart failure: Secondary | ICD-10-CM | POA: Diagnosis not present

## 2021-05-30 DIAGNOSIS — I5030 Unspecified diastolic (congestive) heart failure: Secondary | ICD-10-CM | POA: Insufficient documentation

## 2021-06-05 ENCOUNTER — Other Ambulatory Visit: Payer: Self-pay

## 2021-06-05 ENCOUNTER — Encounter: Payer: Self-pay | Admitting: Internal Medicine

## 2021-06-05 ENCOUNTER — Ambulatory Visit: Payer: BC Managed Care – PPO | Admitting: Internal Medicine

## 2021-06-05 VITALS — BP 122/90 | HR 83 | Temp 97.9°F | Resp 16 | Ht 74.0 in | Wt 366.8 lb

## 2021-06-05 DIAGNOSIS — J449 Chronic obstructive pulmonary disease, unspecified: Secondary | ICD-10-CM

## 2021-06-05 DIAGNOSIS — J4489 Other specified chronic obstructive pulmonary disease: Secondary | ICD-10-CM

## 2021-06-05 DIAGNOSIS — I2699 Other pulmonary embolism without acute cor pulmonale: Secondary | ICD-10-CM

## 2021-06-05 DIAGNOSIS — G4733 Obstructive sleep apnea (adult) (pediatric): Secondary | ICD-10-CM

## 2021-06-05 DIAGNOSIS — Z7189 Other specified counseling: Secondary | ICD-10-CM

## 2021-06-05 DIAGNOSIS — R0602 Shortness of breath: Secondary | ICD-10-CM

## 2021-06-05 MED ORDER — TRELEGY ELLIPTA 100-62.5-25 MCG/ACT IN AEPB
1.0000 | INHALATION_SPRAY | Freq: Every day | RESPIRATORY_TRACT | 11 refills | Status: DC
Start: 2021-06-05 — End: 2022-08-30

## 2021-06-05 NOTE — Patient Instructions (Signed)

## 2021-06-05 NOTE — Progress Notes (Signed)
[facility] [address] [place], [place] [address]  Pulmonary Sleep Medicine   Office Visit Note  Patient Name: Hector Hancock DOB: 11-22-1965  MRN [id number]  Date of Service: 06/05/2021  Complaints/HPI: follow up  He had CT scan PET done which did not show myocardial ischemia. Echo done which shows mild dilated RV possibly and also normal size LV estimated EF >55% Cath was done which revealed normal pressures and normal CO with depressed index. He still has some SOB which is likely due to underlying pulmonary disease maybe also the PE and also his size  ROS  General: (-) fever, (-) chills, (-) night sweats, (-) weakness Skin: (-) rashes, (-) itching,. Eyes: (-) visual changes, (-) redness, (-) itching. Nose and Sinuses: (-) nasal stuffiness or itchiness, (-) postnasal drip, (-) nosebleeds, (-) sinus trouble. Mouth and Throat: (-) sore throat, (-) hoarseness. Neck: (-) swollen glands, (-) enlarged thyroid, (-) neck pain. Respiratory: - cough, (-) bloody sputum, + shortness of breath, - wheezing. Cardiovascular: + ankle swelling, (-) chest pain. Lymphatic: (-) lymph node enlargement. Neurologic: (-) numbness, (-) tingling. Psychiatric: (-) anxiety, (-) depression   Current Medication: Outpatient Encounter Medications as of 06/05/2021  Medication Sig   diphenhydrAMINE (BENADRYL) 25 mg capsule Take by mouth.   ELIQUIS 5 MG TABS tablet Take by mouth 2 (two) times daily.   empagliflozin (JARDIANCE) 10 MG TABS tablet Take 1 tablet by mouth daily.   enalapril (VASOTEC) 5 MG tablet Take 1 tablet by mouth daily.   gabapentin (NEURONTIN) 300 MG capsule Take by mouth. Take 3 capsules at bedtime   hydroxychloroquine (PLAQUENIL) 200 MG tablet Take 200 mg by mouth daily.   spironolactone (ALDACTONE) 25 MG tablet Take 25 mg by mouth daily.   Torsemide 40 MG TABS Take 1 tablet by mouth daily.   [DISCONTINUED] gabapentin (NEURONTIN) 300 MG capsule TAKE 1 CAP AT BEDTIME FOR 4  DAYS, 1 CAP TWICE A DAY FOR 4 DAYS, 1 CAP THREE TIMES A DAY AS STANDING DOSE UNTIL DIRECTED OTHERWISE (Patient not taking: Reported on 06/05/2021)   [DISCONTINUED] naproxen sodium (ALEVE) 220 MG tablet Take by mouth. (Patient not taking: Reported on 06/05/2021)   No facility-administered encounter medications on file as of 06/05/2021.    Surgical History: Past Surgical History:  Procedure Laterality Date   COLONOSCOPY WITH PROPOFOL N/A 12/27/2015   Procedure: COLONOSCOPY WITH PROPOFOL;  Surgeon: Midge Minium, MD;  Location: [facility];  Service: Endoscopy;  Laterality: N/A;   EXTRACORPOREAL SHOCK WAVE LITHOTRIPSY Left 07/28/2018   Procedure: EXTRACORPOREAL SHOCK WAVE LITHOTRIPSY (ESWL);  Surgeon: Orson Ape, MD;  Location: [facility];  Service: Urology;  Laterality: Left;   EXTRACORPOREAL SHOCK WAVE LITHOTRIPSY Right 01/04/2020   Procedure: EXTRACORPOREAL SHOCK WAVE LITHOTRIPSY (ESWL);  Surgeon: Orson Ape, MD;  Location: [facility];  Service: Urology;  Laterality: Right;   POLYPECTOMY  12/27/2015   Procedure: POLYPECTOMY;  Surgeon: Midge Minium, MD;  Location: [facility];  Service: Endoscopy;;   SACROILIAC JOINT FUSION Right 09/06/2013   Procedure: SACROILIAC JOINT FUSION;  Surgeon: Emilee Hero, MD;  Location: [facility];  Service: Orthopedics;  Laterality: Right;  Right sided sacroiliac joint fusion   TOTAL SHOULDER ARTHROPLASTY Left    WRIST SURGERY Left     Medical History: Past Medical History:  Diagnosis Date   Arthritis    History of kidney stones    Hypertension     Family History: Family History  Problem Relation Age of Onset  Diabetes Mother    Arthritis Father    Diabetes Sister    Diabetes Sister     Social History: Social History   Socioeconomic History   Marital status: Married    Spouse name: Not on file   Number of children: Not on file   Years of education: Not on file   Highest education level: Not on file  Occupational  History   Not on file  Tobacco Use   Smoking status: Never   Smokeless tobacco: Never  Substance and Sexual Activity   Alcohol use: No   Drug use: No   Sexual activity: Not on file  Other Topics Concern   Not on file  Social History Narrative   Not on file   Social Determinants of Health   Financial Resource Strain: Not on file  Food Insecurity: Not on file  Transportation Needs: Not on file  Physical Activity: Not on file  Stress: Not on file  Social Connections: Not on file  Intimate Partner Violence: Not on file    Vital Signs: Blood pressure 122/90, pulse 83, temperature 97.9 F (36.6 C), resp. rate 16, height 6\' 2"  (1.88 m), weight (!) 366 lb 12.8 oz (166.4 kg), SpO2 97 %.  Examination: General Appearance: The patient is well-developed, well-nourished, and in no distress. Skin: Gross inspection of skin unremarkable. Head: normocephalic, no gross deformities. Eyes: no gross deformities noted. ENT: ears appear grossly normal no exudates. Neck: Supple. No thyromegaly. No LAD. Respiratory: no rhonchi noted. Cardiovascular: Normal S1 and S2 without murmur or rub. Extremities: No cyanosis. pulses are equal. Neurologic: Alert and oriented. No involuntary movements.  LABS: Recent Results (from the past 2160 hour(s))  Basic metabolic panel     Status: Abnormal   Collection Time: 03/23/21  9:32 AM  Result Value Ref Range   Sodium 139 135 - 145 mmol/L   Potassium 4.0 3.5 - 5.1 mmol/L   Chloride 107 98 - 111 mmol/L   CO2 22 22 - 32 mmol/L   Glucose, Bld 133 (H) 70 - 99 mg/dL    Comment: Glucose reference range applies only to samples taken after fasting for at least 8 hours.   BUN 15 6 - 20 mg/dL   Creatinine, Ser 05/23/21 0.61 - 1.24 mg/dL   Calcium 8.7 (L) 8.9 - 10.3 mg/dL   GFR, Estimated 0.35 >00 mL/min    Comment: (NOTE) Calculated using the CKD-EPI Creatinine Equation (2021)    Anion gap 10 5 - 15    Comment: Performed at [facility], [address] [place], [place]  CBC     Status: None   Collection Time: 03/23/21  9:32 AM  Result Value Ref Range   WBC 7.6 4.0 - 10.5 K/uL   RBC 5.27 4.22 - 5.81 MIL/uL   Hemoglobin 16.1 13.0 - 17.0 g/dL   HCT 05/23/21 93.7 - 16.9 %   MCV 91.1 80.0 - 100.0 fL   MCH 30.6 26.0 - 34.0 pg   MCHC 33.5 30.0 - 36.0 g/dL   RDW 67.8 93.8 - 10.1 %   Platelets 160 150 - 400 K/uL   nRBC 0.0 0.0 - 0.2 %    Comment: Performed at [facility], [address] [place], [place]  Troponin I (High Sensitivity)     Status: None   Collection Time: 03/23/21  9:32 AM  Result Value Ref Range   Troponin I (High Sensitivity) 12 <18 ng/L    Comment: (NOTE) Elevated  high sensitivity troponin I (hsTnI) values and significant  changes across serial measurements may suggest ACS but many other  chronic and acute conditions are known to elevate hsTnI results.  Refer to the "Links" section for chest pain algorithms and additional  guidance. Performed at [facility], [address] [place], [place] [address]   Brain natriuretic peptide     Status: None   Collection Time: 03/23/21  9:32 AM  Result Value Ref Range   B Natriuretic Peptide 32.2 0.0 - 100.0 pg/mL    Comment: Performed at [facility], [address] [place], [place] [address]  Troponin I (High Sensitivity)     Status: None   Collection Time: 03/23/21 11:43 AM  Result Value Ref Range   Troponin I (High Sensitivity) 10 <18 ng/L    Comment: (NOTE) Elevated high sensitivity troponin I (hsTnI) values and significant  changes across serial measurements may suggest ACS but many other  chronic and acute conditions are known to elevate hsTnI results.  Refer to the "Links" section for chest pain algorithms and additional  guidance. Performed at [facility], [address], [place], [place] [address]     Radiology: US Venous Img Lower Unilateral Left (DVT)  Result Date: 04/24/2021 CLINICAL DATA:  L03.116  and M79.605, M79.89 EXAM: LEFT LOWER EXTREMITY VENOUS DOPPLER ULTRASOUND TECHNIQUE: Gray-scale sonography with graded compression, as well as color Doppler and duplex ultrasound were performed to evaluate the lower extremity deep venous systems from the level of the common femoral vein and including the common femoral, femoral, profunda femoral, popliteal and calf veins including the posterior tibial, peroneal and gastrocnemius veins when visible. The superficial great saphenous vein was also interrogated. Spectral Doppler was utilized to evaluate flow at rest and with distal augmentation maneuvers in the common femoral, femoral and popliteal veins. COMPARISON:  None. FINDINGS: Contralateral Common Femoral Vein: Respiratory phasicity is normal and symmetric with the symptomatic side. No evidence of thrombus. Normal compressibility. Common Femoral Vein: No evidence of thrombus. Normal compressibility, respiratory phasicity and response to augmentation. Saphenofemoral Junction: No evidence of thrombus. Normal compressibility and flow on color Doppler imaging. Profunda Femoral Vein: No evidence of thrombus. Normal compressibility and flow on color Doppler imaging. Femoral Vein: The proximal femoral vein is patent. There is expansile hypoechoic occlusive thrombus involving the mid and distal femoral vein. Popliteal Vein: Expansile hypoechoic occlusive thrombus. Calf Veins: Visualized calf veins demonstrate poor compressibility consistent with thrombus. Other Findings:  Calf edema. IMPRESSION: There is acute-appearing occlusive DVT involving the left lower extremity deep veins extending up to the mid left femoral vein. These results will be called to the ordering clinician or representative by the Radiologist Assistant, and communication documented in the PACS or [employer]. Electronically Signed   By: Olive Bass M.D.   On: 04/24/2021 13:34    No results found.  No results found.    Assessment and  Plan: Patient Active Problem List   Diagnosis Date Noted   Shortness of breath 04/11/2021   Snoring 02/24/2021   Rheumatoid arthritis involving both hands with positive rheumatoid factor (HCC) 04/19/2020   Class 3 severe obesity due to excess calories with serious comorbidity and body mass index (BMI) of 45.0 to 49.9 in adult [facility]) 04/19/2020   History of nephrolithiasis 04/19/2020   Hypertension    Special screening for malignant neoplasms, colon    1. SOB (shortness of breath) We will go ahead and do follow-up pulmonary functions on him. - Pulmonary function  test; Future  2. Obesity, morbid (HCC) Obesity Counseling: Had a lengthy discussion regarding patients BMI and weight issues. Patient was instructed on portion control as well as increased activity. Also discussed caloric restrictions with trying to maintain intake less than 2000 Kcal. Discussions were made in accordance with the 5As of weight management. Simple actions such as not eating late and if able to, taking a walk is suggested.   3. OSA (obstructive sleep apnea) He needs to have a CPAP titration went ahead and ordered this - Cpap titration; Future  4. CPAP use counseling Patient needs to be compliant with recommended therapy with Pap once this is done  5. Pulmonary embolism, bilateral (HCC) Has been on anticoagulation some residual shortness of breath is better we will need to continue to monitor if the symptoms do not completely resolve would do a follow-up echocardiogram to detect for pulmonary hypertension  6. Obstructive chronic bronchitis without exacerbation (HCC) Doing fine with inhalers went ahead and gave him a prescription for renewals - Fluticasone-Umeclidin-Vilant (TRELEGY ELLIPTA) 100-62.5-25 MCG/ACT AEPB; Inhale 1 puff into the lungs daily.  Dispense: 1 each; Refill: 11   General Counseling: I have discussed the findings of the evaluation and examination with Swanson.  I have also discussed any further  diagnostic evaluation thatmay be needed or ordered today. Daymen verbalizes understanding of the findings of todays visit. We also reviewed his medications today and discussed drug interactions and side effects including but not limited excessive drowsiness and altered mental states. We also discussed that there is always a risk not just to him but also people around him. he has been encouraged to call the office with any questions or concerns that should arise related to todays visit.  Orders Placed This Encounter  Procedures   Pulmonary function test    Standing Status:   Future    Standing Expiration Date:   06/05/2022    Order Specific Question:   Where should this test be performed?    Answer:   [facility]   Cpap titration    Standing Status:   Future    Standing Expiration Date:   06/05/2022    Order Specific Question:   Where should this test be performed:    Answer:   [facility]     Time spent: 70  I have personally obtained a history, examined the patient, evaluated laboratory and imaging results, formulated the assessment and plan and placed orders.    Yevonne Pax, MD [facility] Pulmonary and Critical Care Sleep medicine

## 2021-06-06 ENCOUNTER — Inpatient Hospital Stay: Payer: BC Managed Care – PPO | Admitting: Nurse Practitioner

## 2021-06-13 ENCOUNTER — Telehealth: Payer: Self-pay

## 2021-06-13 NOTE — Telephone Encounter (Signed)
Patients CPAP titration order was completed and signed. It was placed in the FG folder for pick up.

## 2021-06-18 ENCOUNTER — Ambulatory Visit: Payer: BC Managed Care – PPO | Admitting: Internal Medicine

## 2021-06-23 ENCOUNTER — Encounter: Payer: BC Managed Care – PPO | Admitting: Nurse Practitioner

## 2021-07-14 ENCOUNTER — Ambulatory Visit: Payer: BC Managed Care – PPO | Admitting: Internal Medicine

## 2021-08-20 ENCOUNTER — Other Ambulatory Visit: Payer: Self-pay | Admitting: Internal Medicine

## 2021-10-13 ENCOUNTER — Ambulatory Visit (INDEPENDENT_AMBULATORY_CARE_PROVIDER_SITE_OTHER): Payer: BC Managed Care – PPO | Admitting: Internal Medicine

## 2021-10-13 VITALS — BP 139/87 | HR 93 | Resp 18 | Ht 74.0 in | Wt 362.0 lb

## 2021-10-13 DIAGNOSIS — Z9989 Dependence on other enabling machines and devices: Secondary | ICD-10-CM | POA: Diagnosis not present

## 2021-10-13 DIAGNOSIS — Z7189 Other specified counseling: Secondary | ICD-10-CM | POA: Diagnosis not present

## 2021-10-13 DIAGNOSIS — G4733 Obstructive sleep apnea (adult) (pediatric): Secondary | ICD-10-CM

## 2021-10-13 DIAGNOSIS — N200 Calculus of kidney: Secondary | ICD-10-CM | POA: Insufficient documentation

## 2021-10-13 NOTE — Patient Instructions (Signed)

## 2021-10-13 NOTE — Progress Notes (Signed)
[facility] [place], [place] [address]  Pulmonary Sleep Medicine   Office Visit Note  Patient Name: Hector Hancock DOB: [birth year] MRN [id number]    Chief Complaint: Obstructive Sleep Apnea visit  Brief History:  Parish is seen today for initial consult to establish care.   The patient has a 7 month history of sleep apnea and initial conditions included  Arthritis, kidney disease, hypertension,  snoring,   blood clots in legs.  Patient is using PAP nightly.  The patient feels better after sleeping with PAP.  The patient reports feeling no difference. from PAP use, but snoring rarely.  Normal bedtime midnight  - 1 am and wake up around 7 am. Epworth Sleepiness Score is 0 out of 24. The patient does not take naps. The patient complains of the following: nose irritation  from small wide N3oi nasal mask.   The compliance download shows  compliance with an average use time of 5:27 hours@ 100%. The AHI is 0.7  The patient does not complain of limb movements disrupting sleep.  ROS  General: (-) fever, (-) chills, (-) night sweat Nose and Sinuses: (-) nasal stuffiness or itchiness, (-) postnasal drip, (-) nosebleeds, (-) sinus trouble. Mouth and Throat: (-) sore throat, (-) hoarseness. Neck: (-) swollen glands, (-) enlarged thyroid, (-) neck pain. Respiratory: - cough, + shortness of breath, + wheezing. Neurologic: - numbness, - tingling. Psychiatric: - anxiety, - depression   Current Medication: Outpatient Encounter Medications as of 10/13/2021  Medication Sig   apixaban (ELIQUIS) 5 MG TABS tablet Take by mouth.   spironolactone (ALDACTONE) 50 MG tablet Take by mouth.   diphenhydrAMINE (BENADRYL) 25 mg capsule Take by mouth.   ELIQUIS 5 MG TABS tablet Take by mouth 2 (two) times daily.   enalapril (VASOTEC) 5 MG tablet TAKE 1 TABLET (5 MG TOTAL) BY MOUTH DAILY.   Fluticasone-Umeclidin-Vilant (TRELEGY ELLIPTA) 100-62.5-25 MCG/ACT AEPB Inhale 1 puff into the lungs  daily.   gabapentin (NEURONTIN) 300 MG capsule Take by mouth. Take 3 capsules at bedtime   hydroxychloroquine (PLAQUENIL) 200 MG tablet Take 200 mg by mouth daily.   JARDIANCE 10 MG TABS tablet Take 10 mg by mouth daily.   spironolactone (ALDACTONE) 25 MG tablet Take 25 mg by mouth daily.   tamsulosin (FLOMAX) 0.4 MG CAPS capsule Take 0.4 mg by mouth daily.   torsemide (DEMADEX) 20 MG tablet Take 20 mg by mouth daily.   No facility-administered encounter medications on file as of 10/13/2021.    Surgical History: Past Surgical History:  Procedure Laterality Date   COLONOSCOPY WITH PROPOFOL N/A 12/27/2015   Procedure: COLONOSCOPY WITH PROPOFOL;  Surgeon: Lucilla Lame, MD;  Location: [facility];  Service: Endoscopy;  Laterality: N/A;   EXTRACORPOREAL SHOCK WAVE LITHOTRIPSY Left 07/28/2018   Procedure: EXTRACORPOREAL SHOCK WAVE LITHOTRIPSY (ESWL);  Surgeon: Royston Cowper, MD;  Location: [facility];  Service: Urology;  Laterality: Left;   EXTRACORPOREAL SHOCK WAVE LITHOTRIPSY Right 01/04/2020   Procedure: EXTRACORPOREAL SHOCK WAVE LITHOTRIPSY (ESWL);  Surgeon: Royston Cowper, MD;  Location: [facility];  Service: Urology;  Laterality: Right;   POLYPECTOMY  12/27/2015   Procedure: POLYPECTOMY;  Surgeon: Lucilla Lame, MD;  Location: [facility];  Service: Endoscopy;;   SACROILIAC JOINT FUSION Right 09/06/2013   Procedure: SACROILIAC JOINT FUSION;  Surgeon: Sinclair Ship, MD;  Location: [facility];  Service: Orthopedics;  Laterality: Right;  Right sided sacroiliac joint fusion   TOTAL SHOULDER ARTHROPLASTY Left  WRIST SURGERY Left     Medical History: Past Medical History:  Diagnosis Date   Arthritis    History of kidney stones    Hypertension     Family History: Non contributory to the present illness  Social History: Social History   Socioeconomic History   Marital status: Married    Spouse name: Not on file   Number of children: Not on file   Years of  education: Not on file   Highest education level: Not on file  Occupational History   Not on file  Tobacco Use   Smoking status: Never   Smokeless tobacco: Never  Substance and Sexual Activity   Alcohol use: No   Drug use: No   Sexual activity: Not on file  Other Topics Concern   Not on file  Social History Narrative   Not on file   Social Determinants of Health   Financial Resource Strain: Not on file  Food Insecurity: Not on file  Transportation Needs: Not on file  Physical Activity: Not on file  Stress: Not on file  Social Connections: Not on file  Intimate Partner Violence: Not on file    Vital Signs: Blood pressure 139/87, pulse 93, resp. rate 18, height 6\' 2"  (1.88 m), weight (!) 362 lb (164.2 kg), SpO2 95 %. Body mass index is 46.48 kg/m.    Examination: General Appearance: The patient is well-developed, well-nourished, and in no distress. Neck Circumference: 45.5 cm Skin: Gross inspection of skin unremarkable. Head: normocephalic, no gross deformities. Eyes: no gross deformities noted. ENT: ears appear grossly normal Neurologic: Alert and oriented. No involuntary movements.    EPWORTH SLEEPINESS SCALE:  Scale:  (0)= no chance of dozing; (1)= slight chance of dozing; (2)= moderate chance of dozing; (3)= high chance of dozing  Chance  Situtation    Sitting and reading: 0    Watching TV: 0    Sitting Inactive in public: 0    As a passenger in car: 0      Lying down to rest: 0    Sitting and talking: 0    Sitting quielty after lunch: 0    In a car, stopped in traffic: 0   TOTAL SCORE:   0 out of 24    SLEEP STUDIES:  PSG 04/01/21  -  overall AHI 101,  min SpO2 59% in REM   CPAP COMPLIANCE DATA:  Date Range: 09/10/21 - 10/09/2021  Average Daily Use:  7:29 hours  Median Use: 5:20 hours  Compliance for > 4 Hours: 100% days  AHI: 0.7 respiratory events per hour  Days Used: 30/30  Mask Leak: 11.2 lpm  95th Percentile Pressure:  11.2 cmH2O   LABS: No results found for this or any previous visit (from the past 2160 hour(s)).  Radiology: US Venous Img Lower Unilateral Left (DVT)  Result Date: 04/24/2021 CLINICAL DATA:  L03.116 and M79.605, M79.89 EXAM: LEFT LOWER EXTREMITY VENOUS DOPPLER ULTRASOUND TECHNIQUE: Gray-scale sonography with graded compression, as well as color Doppler and duplex ultrasound were performed to evaluate the lower extremity deep venous systems from the level of the common femoral vein and including the common femoral, femoral, profunda femoral, popliteal and calf veins including the posterior tibial, peroneal and gastrocnemius veins when visible. The superficial great saphenous vein was also interrogated. Spectral Doppler was utilized to evaluate flow at rest and with distal augmentation maneuvers in the common femoral, femoral and popliteal veins. COMPARISON:  None. FINDINGS: Contralateral Common Femoral Vein: Respiratory phasicity is  normal and symmetric with the symptomatic side. No evidence of thrombus. Normal compressibility. Common Femoral Vein: No evidence of thrombus. Normal compressibility, respiratory phasicity and response to augmentation. Saphenofemoral Junction: No evidence of thrombus. Normal compressibility and flow on color Doppler imaging. Profunda Femoral Vein: No evidence of thrombus. Normal compressibility and flow on color Doppler imaging. Femoral Vein: The proximal femoral vein is patent. There is expansile hypoechoic occlusive thrombus involving the mid and distal femoral vein. Popliteal Vein: Expansile hypoechoic occlusive thrombus. Calf Veins: Visualized calf veins demonstrate poor compressibility consistent with thrombus. Other Findings:  Calf edema. IMPRESSION: There is acute-appearing occlusive DVT involving the left lower extremity deep veins extending up to the mid left femoral vein. These results will be called to the ordering clinician or representative by the Radiologist  Assistant, and communication documented in the PACS or [employer]. Electronically Signed   By: Albin Felling M.D.   On: 04/24/2021 13:34    No results found.  No results found.    Assessment and Plan: Patient Active Problem List   Diagnosis Date Noted   OSA on CPAP 10/13/2021   CPAP use counseling 10/13/2021   Morbid obesity ([place]) 10/13/2021   Kidney stone 10/13/2021   Acute exacerbation of CHF (congestive heart failure) ([place]) 05/30/2021   Deep vein thrombosis (DVT) of left lower extremity ([place]) 05/30/2021   Heart failure with preserved ejection fraction ([place]) 05/30/2021   Shortness of breath 04/11/2021   Bilateral pulmonary embolism ([place]) 04/04/2021   Pain and swelling of left lower extremity 04/04/2021   DOE (dyspnea on exertion) 04/04/2021   Benign essential HTN 03/25/2021   SOBOE (shortness of breath on exertion) 03/25/2021   Snoring 02/24/2021   Rheumatoid arthritis involving both hands with positive rheumatoid factor ([place]) 04/19/2020   Class 3 severe obesity due to excess calories with serious comorbidity and body mass index (BMI) of 45.0 to 49.9 in adult [facility]) 04/19/2020   History of nephrolithiasis 04/19/2020   Hypertension    Pain of right hand 06/17/2017   Special screening for malignant neoplasms, colon     1. OSA on CPAP The patient does tolerate PAP and reports  benefit from PAP use. The patient was reminded how to clean equipment and advised to replace supplies routinely. The patient was also counselled on weight loss. The compliance is excellent. The AHI is 0.7.   OSA- continue with excellent compliance. F/u 34m.    2. CPAP use counseling CPAP Counseling: had a lengthy discussion with the patient regarding the importance of PAP therapy in management of the sleep apnea. Patient appears to understand the risk factor reduction and also understands the risks associated with untreated sleep apnea. Patient will try to make a good faith effort to remain  compliant with therapy. Also instructed the patient on proper cleaning of the device including the water must be changed daily if possible and use of distilled water is preferred. Patient understands that the machine should be regularly cleaned with appropriate recommended cleaning solutions that do not damage the PAP machine for example given white vinegar and water rinses. Other methods such as ozone treatment may not be as good as these simple methods to achieve cleaning.   3. Morbid obesity ([place]) Obesity Counseling: Had a lengthy discussion regarding patients BMI and weight issues. Patient was instructed on portion control as well as increased activity. Also discussed caloric restrictions with trying to maintain intake less than 2000 Kcal. Discussions were made in accordance with the 5As of weight management.  Simple actions such as not eating late and if able to, taking a walk is suggested.    General Counseling: I have discussed the findings of the evaluation and examination with Demarian.  I have also discussed any further diagnostic evaluation thatmay be needed or ordered today. Ozzy verbalizes understanding of the findings of todays visit. We also reviewed his medications today and discussed drug interactions and side effects including but not limited excessive drowsiness and altered mental states. We also discussed that there is always a risk not just to him but also people around him. he has been encouraged to call the office with any questions or concerns that should arise related to todays visit.  No orders of the defined types were placed in this encounter.       I have personally obtained a history, examined the patient, evaluated laboratory and imaging results, formulated the assessment and plan and placed orders. This patient was seen today by Tressie Ellis, PA-C in collaboration with Dr. Devona Konig.   Allyne Gee, MD [facility] Diplomate ABMS Pulmonary Critical Care Medicine and Sleep  Medicine

## 2022-03-20 DIAGNOSIS — E1165 Type 2 diabetes mellitus with hyperglycemia: Secondary | ICD-10-CM | POA: Diagnosis present

## 2022-04-06 ENCOUNTER — Ambulatory Visit (INDEPENDENT_AMBULATORY_CARE_PROVIDER_SITE_OTHER): Payer: BC Managed Care – PPO | Admitting: Internal Medicine

## 2022-04-06 VITALS — BP 120/77 | HR 100 | Resp 16 | Ht 74.0 in | Wt 350.0 lb

## 2022-04-06 DIAGNOSIS — Z9989 Dependence on other enabling machines and devices: Secondary | ICD-10-CM

## 2022-04-06 DIAGNOSIS — Z7189 Other specified counseling: Secondary | ICD-10-CM

## 2022-04-06 DIAGNOSIS — G4733 Obstructive sleep apnea (adult) (pediatric): Secondary | ICD-10-CM

## 2022-04-06 NOTE — Progress Notes (Signed)
[facility] [address] [place], [place] [address]  Pulmonary Sleep Medicine   Office Visit Note  Patient Name: Hector Hancock DOB: 06-30-1966 MRN [id number]    Chief Complaint: Obstructive Sleep Apnea visit  Brief History:  Hector Hancock is seen today for a 6 month follow up on APAP at 4-20 cmh20.  The patient has a 1 year history of sleep apnea. Patient is not using PAP nightly.  The patient feels rested with PAP or without PAP.  The patient reports feeling the same when not using PAP and from PAP use. Reported sleepiness is  improved and the Epworth Sleepiness Score is 0 out of 24. The patient does not take naps. The patient complains of the following: No complaints with the therapy.  The compliance download shows  43% compliance with an average use time of 3 hours 29 minutes. The AHI is 0.9.  The patient does not complain of limb movements disrupting sleep.  ROS  General: (-) fever, (-) chills, (-) night sweat Nose and Sinuses: (-) nasal stuffiness or itchiness, (-) postnasal drip, (-) nosebleeds, (-) sinus trouble. Mouth and Throat: (-) sore throat, (-) hoarseness. Neck: (-) swollen glands, (-) enlarged thyroid, (-) neck pain. Respiratory: - cough, - shortness of breath, - wheezing. Neurologic: - numbness, - tingling. Psychiatric: - anxiety, - depression   Current Medication: Outpatient Encounter Medications as of 04/06/2022  Medication Sig   apixaban (ELIQUIS) 5 MG TABS tablet Take by mouth.   diphenhydrAMINE (BENADRYL) 25 mg capsule Take by mouth.   ELIQUIS 5 MG TABS tablet Take by mouth 2 (two) times daily.   enalapril (VASOTEC) 5 MG tablet TAKE 1 TABLET (5 MG TOTAL) BY MOUTH DAILY.   Fluticasone-Umeclidin-Vilant (TRELEGY ELLIPTA) 100-62.5-25 MCG/ACT AEPB Inhale 1 puff into the lungs daily.   gabapentin (NEURONTIN) 300 MG capsule Take by mouth. Take 3 capsules at bedtime   hydroxychloroquine (PLAQUENIL) 200 MG tablet Take 200 mg by mouth daily.   JARDIANCE 10 MG  TABS tablet Take 10 mg by mouth daily.   spironolactone (ALDACTONE) 25 MG tablet Take 25 mg by mouth daily.   spironolactone (ALDACTONE) 50 MG tablet Take by mouth.   tamsulosin (FLOMAX) 0.4 MG CAPS capsule Take 0.4 mg by mouth daily.   torsemide (DEMADEX) 20 MG tablet Take 20 mg by mouth daily.   No facility-administered encounter medications on file as of 04/06/2022.    Surgical History: Past Surgical History:  Procedure Laterality Date   COLONOSCOPY WITH PROPOFOL N/A 12/27/2015   Procedure: COLONOSCOPY WITH PROPOFOL;  Surgeon: Midge Minium, MD;  Location: [facility];  Service: Endoscopy;  Laterality: N/A;   EXTRACORPOREAL SHOCK WAVE LITHOTRIPSY Left 07/28/2018   Procedure: EXTRACORPOREAL SHOCK WAVE LITHOTRIPSY (ESWL);  Surgeon: Orson Ape, MD;  Location: [facility];  Service: Urology;  Laterality: Left;   EXTRACORPOREAL SHOCK WAVE LITHOTRIPSY Right 01/04/2020   Procedure: EXTRACORPOREAL SHOCK WAVE LITHOTRIPSY (ESWL);  Surgeon: Orson Ape, MD;  Location: [facility];  Service: Urology;  Laterality: Right;   POLYPECTOMY  12/27/2015   Procedure: POLYPECTOMY;  Surgeon: Midge Minium, MD;  Location[facility];  Service: Endoscopy;;   SACROILIAC JOINT FUSION Right 09/06/2013   Procedure: SACROILIAC JOINT FUSION;  Surgeon: Emilee Hero, MD;  Location: [facility];  Service: Orthopedics;  Laterality: Right;  Right sided sacroiliac joint fusion   TOTAL SHOULDER ARTHROPLASTY Left    WRIST SURGERY Left     Medical History: Past Medical History:  Diagnosis Date   Arthritis  History of kidney stones    Hypertension     Family History: Non contributory to the present illness  Social History: Social History   Socioeconomic History   Marital status: Married    Spouse name: Not on file   Number of children: Not on file   Years of education: Not on file   Highest education level: Not on file  Occupational History   Not on file  Tobacco Use   Smoking  status: Never   Smokeless tobacco: Never  Substance and Sexual Activity   Alcohol use: No   Drug use: No   Sexual activity: Not on file  Other Topics Concern   Not on file  Social History Narrative   Not on file   Social Determinants of Health   Financial Resource Strain: Not on file  Food Insecurity: Not on file  Transportation Needs: Not on file  Physical Activity: Not on file  Stress: Not on file  Social Connections: Not on file  Intimate Partner Violence: Not on file    Vital Signs: Blood pressure 120/77, pulse 100, resp. rate 16, height 6\' 2"  (1.88 m), weight (!) 350 lb (158.8 kg), SpO2 95 %. Body mass index is 44.94 kg/m.    Examination: General Appearance: The patient is well-developed, well-nourished, and in no distress. Neck Circumference: 47 cm Skin: Gross inspection of skin unremarkable. Head: normocephalic, no gross deformities. Eyes: no gross deformities noted. ENT: ears appear grossly normal Neurologic: Alert and oriented. No involuntary movements.  STOP BANG RISK ASSESSMENT S (snore) Have you been told that you snore?     NO   T (tired) Are you often tired, fatigued, or sleepy during the day?   NO  O (obstruction) Do you stop breathing, choke, or gasp during sleep? NO   P (pressure) Do you have or are you being treated for high blood pressure? YES   B (BMI) Is your body index greater than 35 kg/m? YES   A (age) Are you 64 years old or older? YES   N (neck) Do you have a neck circumference greater than 16 inches?   YES   G (gender) Are you a male? YES   TOTAL STOP/BANG "YES" ANSWERS 5       A STOP-Bang score of 2 or less is considered low risk, and a score of 5 or more is high risk for having either moderate or severe OSA. For people who score 3 or 4, doctors may need to perform further assessment to determine how likely they are to have OSA.         EPWORTH SLEEPINESS SCALE:  Scale:  (0)= no chance of dozing; (1)= slight chance of dozing;  (2)= moderate chance of dozing; (3)= high chance of dozing  Chance  Situtation    Sitting and reading: 0    Watching TV: 0    Sitting Inactive in public: 0    As a passenger in car: 0      Lying down to rest: 0    Sitting and talking: 0    Sitting quielty after lunch: 0    In a car, stopped in traffic: 0   TOTAL SCORE:   0 out of 24    SLEEP STUDIES:  PSG 04/01/21  -  overall AHI 101,  min SpO2 59% in REM   CPAP COMPLIANCE DATA:  Date Range: 10/04/21 - 04/01/22  Average Daily Use: 3 hours 29 minutes  Median Use: 3 hours 47 minutes  Compliance for > 4 Hours: 87 days  AHI: 0.9 respiratory events per hour  Days Used: 165/180  Mask Leak: 24.3  95th Percentile Pressure: 12.7 cmh20         LABS: No results found for this or any previous visit (from the past 2160 hour(s)).  Radiology: US Venous Img Lower Unilateral Left (DVT)  Result Date: 04/24/2021 CLINICAL DATA:  L03.116 and M79.605, M79.89 EXAM: LEFT LOWER EXTREMITY VENOUS DOPPLER ULTRASOUND TECHNIQUE: Gray-scale sonography with graded compression, as well as color Doppler and duplex ultrasound were performed to evaluate the lower extremity deep venous systems from the level of the common femoral vein and including the common femoral, femoral, profunda femoral, popliteal and calf veins including the posterior tibial, peroneal and gastrocnemius veins when visible. The superficial great saphenous vein was also interrogated. Spectral Doppler was utilized to evaluate flow at rest and with distal augmentation maneuvers in the common femoral, femoral and popliteal veins. COMPARISON:  None. FINDINGS: Contralateral Common Femoral Vein: Respiratory phasicity is normal and symmetric with the symptomatic side. No evidence of thrombus. Normal compressibility. Common Femoral Vein: No evidence of thrombus. Normal compressibility, respiratory phasicity and response to augmentation. Saphenofemoral Junction: No evidence of  thrombus. Normal compressibility and flow on color Doppler imaging. Profunda Femoral Vein: No evidence of thrombus. Normal compressibility and flow on color Doppler imaging. Femoral Vein: The proximal femoral vein is patent. There is expansile hypoechoic occlusive thrombus involving the mid and distal femoral vein. Popliteal Vein: Expansile hypoechoic occlusive thrombus. Calf Veins: Visualized calf veins demonstrate poor compressibility consistent with thrombus. Other Findings:  Calf edema. IMPRESSION: There is acute-appearing occlusive DVT involving the left lower extremity deep veins extending up to the mid left femoral vein. These results will be called to the ordering clinician or representative by the Radiologist Assistant, and communication documented in the PACS or [employer]. Electronically Signed   By: Olive Bass M.D.   On: 04/24/2021 13:34    No results found.  No results found.    Assessment and Plan: Patient Active Problem List   Diagnosis Date Noted   OSA on CPAP 10/13/2021   CPAP use counseling 10/13/2021   Morbid obesity ([facility]) 10/13/2021   Kidney stone 10/13/2021   Acute exacerbation of CHF (congestive heart failure) ([facility]) 05/30/2021   Deep vein thrombosis (DVT) of left lower extremity ([facility]) 05/30/2021   Heart failure with preserved ejection fraction (HCC) 05/30/2021   Shortness of breath 04/11/2021   Bilateral pulmonary embolism (HCC) 04/04/2021   Pain and swelling of left lower extremity 04/04/2021   DOE (dyspnea on exertion) 04/04/2021   Benign essential HTN 03/25/2021   SOBOE (shortness of breath on exertion) 03/25/2021   Snoring 02/24/2021   Rheumatoid arthritis involving both hands with positive rheumatoid factor (HCC) 04/19/2020   Class 3 severe obesity due to excess calories with serious comorbidity and body mass index (BMI) of 45.0 to 49.9 in adult [facility]) 04/19/2020   History of nephrolithiasis 04/19/2020   Hypertension    Pain of right hand 06/17/2017    Special screening for malignant neoplasms, colon    1. OSA on CPAP The patient does tolerate PAP and reports  benefit from PAP use. The patient was reminded how to clean equipment and advised to replace supplies routinely. The patient was also counselled on weight loss. The compliance is poor. The AHI is 0.9.   OSA on cpap- CPAP continues to be medically necessary to treat this patient's OSA.  Increase hours of use. F/u one  year.   2. CPAP use counseling CPAP Counseling: had a lengthy discussion with the patient regarding the importance of PAP therapy in management of the sleep apnea. Patient appears to understand the risk factor reduction and also understands the risks associated with untreated sleep apnea. Patient will try to make a good faith effort to remain compliant with therapy. Also instructed the patient on proper cleaning of the device including the water must be changed daily if possible and use of distilled water is preferred. Patient understands that the machine should be regularly cleaned with appropriate recommended cleaning solutions that do not damage the PAP machine for example given white vinegar and water rinses. Other methods such as ozone treatment may not be as good as these simple methods to achieve cleaning.   3. Morbid obesity ([facility]) Obesity Counseling: Had a lengthy discussion regarding patients BMI and weight issues. Patient was instructed on portion control as well as increased activity. Also discussed caloric restrictions with trying to maintain intake less than 2000 Kcal. Discussions were made in accordance with the 5As of weight management. Simple actions such as not eating late and if able to, taking a walk is suggested.     General Counseling: I have discussed the findings of the evaluation and examination with Jonathandavid.  I have also discussed any further diagnostic evaluation thatmay be needed or ordered today. Taggart verbalizes understanding of the findings of  todays visit. We also reviewed his medications today and discussed drug interactions and side effects including but not limited excessive drowsiness and altered mental states. We also discussed that there is always a risk not just to him but also people around him. he has been encouraged to call the office with any questions or concerns that should arise related to todays visit.  No orders of the defined types were placed in this encounter.       I have personally obtained a history, examined the patient, evaluated laboratory and imaging results, formulated the assessment and plan and placed orders. This patient was seen today by Emmaline Kluver, PA-C in collaboration with Dr. Freda Munro.   Yevonne Pax, MD [facility] Diplomate ABMS Pulmonary Critical Care Medicine and Sleep Medicine

## 2022-04-06 NOTE — Patient Instructions (Signed)

## 2022-06-02 DIAGNOSIS — E669 Obesity, unspecified: Secondary | ICD-10-CM | POA: Insufficient documentation

## 2022-08-25 ENCOUNTER — Encounter: Payer: Self-pay | Admitting: Emergency Medicine

## 2022-08-25 ENCOUNTER — Inpatient Hospital Stay
Admission: EM | Admit: 2022-08-25 | Discharge: 2022-08-30 | DRG: 291 | Disposition: A | Discharge status: Home / Self Care | Payer: BC Managed Care – PPO | Attending: Internal Medicine | Admitting: Internal Medicine

## 2022-08-25 ENCOUNTER — Emergency Department: Payer: BC Managed Care – PPO

## 2022-08-25 DIAGNOSIS — M069 Rheumatoid arthritis, unspecified: Secondary | ICD-10-CM | POA: Diagnosis present

## 2022-08-25 DIAGNOSIS — R0609 Other forms of dyspnea: Secondary | ICD-10-CM

## 2022-08-25 DIAGNOSIS — Z888 Allergy status to other drugs, medicaments and biological substances status: Secondary | ICD-10-CM

## 2022-08-25 DIAGNOSIS — I110 Hypertensive heart disease with heart failure: Secondary | ICD-10-CM | POA: Diagnosis not present

## 2022-08-25 DIAGNOSIS — Z7901 Long term (current) use of anticoagulants: Secondary | ICD-10-CM

## 2022-08-25 DIAGNOSIS — I5033 Acute on chronic diastolic (congestive) heart failure: Secondary | ICD-10-CM | POA: Diagnosis not present

## 2022-08-25 DIAGNOSIS — Z86711 Personal history of pulmonary embolism: Secondary | ICD-10-CM

## 2022-08-25 DIAGNOSIS — N179 Acute kidney failure, unspecified: Secondary | ICD-10-CM

## 2022-08-25 DIAGNOSIS — R0902 Hypoxemia: Secondary | ICD-10-CM

## 2022-08-25 DIAGNOSIS — R079 Chest pain, unspecified: Principal | ICD-10-CM

## 2022-08-25 DIAGNOSIS — Z1152 Encounter for screening for COVID-19: Secondary | ICD-10-CM

## 2022-08-25 DIAGNOSIS — Z885 Allergy status to narcotic agent status: Secondary | ICD-10-CM

## 2022-08-25 DIAGNOSIS — G4733 Obstructive sleep apnea (adult) (pediatric): Secondary | ICD-10-CM

## 2022-08-25 DIAGNOSIS — Z86718 Personal history of other venous thrombosis and embolism: Secondary | ICD-10-CM

## 2022-08-25 DIAGNOSIS — Z8261 Family history of arthritis: Secondary | ICD-10-CM

## 2022-08-25 DIAGNOSIS — Z91199 Patient's noncompliance with other medical treatment and regimen due to unspecified reason: Secondary | ICD-10-CM

## 2022-08-25 DIAGNOSIS — Z833 Family history of diabetes mellitus: Secondary | ICD-10-CM

## 2022-08-25 DIAGNOSIS — M1990 Unspecified osteoarthritis, unspecified site: Secondary | ICD-10-CM | POA: Diagnosis present

## 2022-08-25 DIAGNOSIS — T502X5A Adverse effect of carbonic-anhydrase inhibitors, benzothiadiazides and other diuretics, initial encounter: Secondary | ICD-10-CM | POA: Diagnosis not present

## 2022-08-25 DIAGNOSIS — Z6841 Body Mass Index (BMI) 40.0 and over, adult: Secondary | ICD-10-CM

## 2022-08-25 DIAGNOSIS — E1165 Type 2 diabetes mellitus with hyperglycemia: Secondary | ICD-10-CM | POA: Diagnosis present

## 2022-08-25 DIAGNOSIS — R0602 Shortness of breath: Secondary | ICD-10-CM

## 2022-08-25 DIAGNOSIS — E876 Hypokalemia: Secondary | ICD-10-CM | POA: Diagnosis not present

## 2022-08-25 DIAGNOSIS — Z981 Arthrodesis status: Secondary | ICD-10-CM

## 2022-08-25 DIAGNOSIS — I10 Essential (primary) hypertension: Secondary | ICD-10-CM | POA: Diagnosis present

## 2022-08-25 DIAGNOSIS — M05741 Rheumatoid arthritis with rheumatoid factor of right hand without organ or systems involvement: Secondary | ICD-10-CM | POA: Diagnosis present

## 2022-08-25 DIAGNOSIS — R519 Headache, unspecified: Secondary | ICD-10-CM | POA: Clinically undetermined

## 2022-08-25 DIAGNOSIS — J9601 Acute respiratory failure with hypoxia: Secondary | ICD-10-CM | POA: Insufficient documentation

## 2022-08-25 DIAGNOSIS — Z79899 Other long term (current) drug therapy: Secondary | ICD-10-CM

## 2022-08-25 DIAGNOSIS — Z96612 Presence of left artificial shoulder joint: Secondary | ICD-10-CM | POA: Diagnosis present

## 2022-08-25 LAB — CBC
HCT: 48.1 % (ref 39.0–52.0)
Hemoglobin: 15.6 g/dL (ref 13.0–17.0)
MCH: 29 pg (ref 26.0–34.0)
MCHC: 32.4 g/dL (ref 30.0–36.0)
MCV: 89.4 fL (ref 80.0–100.0)
Platelets: 218 10*3/uL (ref 150–400)
RBC: 5.38 MIL/uL (ref 4.22–5.81)
RDW: 14.8 % (ref 11.5–15.5)
WBC: 11.7 10*3/uL — ABNORMAL HIGH (ref 4.0–10.5)
nRBC: 0 % (ref 0.0–0.2)

## 2022-08-25 LAB — BASIC METABOLIC PANEL
Anion gap: 12 (ref 5–15)
BUN: 26 mg/dL — ABNORMAL HIGH (ref 6–20)
CO2: 21 mmol/L — ABNORMAL LOW (ref 22–32)
Calcium: 8.8 mg/dL — ABNORMAL LOW (ref 8.9–10.3)
Chloride: 101 mmol/L (ref 98–111)
Creatinine, Ser: 1.78 mg/dL — ABNORMAL HIGH (ref 0.61–1.24)
GFR, Estimated: 44 mL/min — ABNORMAL LOW (ref >60)
Glucose, Bld: 229 mg/dL — ABNORMAL HIGH (ref 70–99)
Potassium: 4.4 mmol/L (ref 3.5–5.1)
Sodium: 134 mmol/L — ABNORMAL LOW (ref 135–145)

## 2022-08-25 LAB — BRAIN NATRIURETIC PEPTIDE: B Natriuretic Peptide: 24.5 pg/mL (ref 0.0–100.0)

## 2022-08-25 LAB — TROPONIN I (HIGH SENSITIVITY): Troponin I (High Sensitivity): 3 ng/L (ref <18)

## 2022-08-25 MED ORDER — IOHEXOL 350 MG/ML SOLN
75.0000 mL | Freq: Once | INTRAVENOUS | Status: AC | PRN
  Administered 2022-08-25: 75 mL via INTRAVENOUS

## 2022-08-25 NOTE — ED Triage Notes (Signed)
FIRST NURSE NOTE:  pt arrived via [facility], pt recently dc'd had DVTs bilaterally, pt is on Xarelto recently changed from eliquids  Lungs clear   [place] 2L, does not wear home O2  Took 3 81mg  ASA prior to EMS  BP changed with EMS.   944/96  ---> 95 systolic  No blood in stool  C/o pain still but not as intense.

## 2022-08-25 NOTE — ED Triage Notes (Incomplete)
Pt presents via [facility] with complaints of mid-sternal CP that started ~30 mins ago. Hx of CHF, DVT and PE - recently D/C for DVT. Pt was recently    Denies SOB.

## 2022-08-25 NOTE — ED Provider Triage Note (Signed)
Emergency Medicine Provider Triage Evaluation Note  Hector Hancock , a 57 y.o. male  was evaluated in triage.  Pt complains of shortness of breath.  History of PE and DVT on Xarelto.  Physical Exam  BP 102/84   Pulse 88   Temp 98 F (36.7 C) (Oral)   Resp (!) 26   Ht 6\' 2"  (1.88 m)   Wt (!) 153.3 kg   SpO2 96%   BMI 43.40 kg/m  Gen:   Awake, no distress   Resp:  Normal effort  MSK:   Moves extremities without difficulty  Other:    Medical Decision Making  Medically screening exam initiated at 10:19 PM.  Appropriate orders placed.  Hector Hancock was informed that the remainder of the evaluation will be completed by another provider, this initial triage assessment does not replace that evaluation, and the importance of remaining in the ED until their evaluation is complete.    Victorino Dike, FNP 08/25/22 2302

## 2022-08-26 ENCOUNTER — Other Ambulatory Visit: Payer: Self-pay

## 2022-08-26 ENCOUNTER — Encounter: Payer: Self-pay | Admitting: Internal Medicine

## 2022-08-26 ENCOUNTER — Inpatient Hospital Stay
Admit: 2022-08-26 | Discharge: 2022-08-26 | Disposition: A | Discharge status: Home / Self Care | Payer: BC Managed Care – PPO | Attending: Internal Medicine | Admitting: Internal Medicine

## 2022-08-26 DIAGNOSIS — Z86711 Personal history of pulmonary embolism: Secondary | ICD-10-CM | POA: Diagnosis not present

## 2022-08-26 DIAGNOSIS — I5033 Acute on chronic diastolic (congestive) heart failure: Secondary | ICD-10-CM | POA: Diagnosis present

## 2022-08-26 DIAGNOSIS — E876 Hypokalemia: Secondary | ICD-10-CM | POA: Diagnosis not present

## 2022-08-26 DIAGNOSIS — Z7901 Long term (current) use of anticoagulants: Secondary | ICD-10-CM | POA: Diagnosis not present

## 2022-08-26 DIAGNOSIS — R519 Headache, unspecified: Secondary | ICD-10-CM | POA: Diagnosis present

## 2022-08-26 DIAGNOSIS — I110 Hypertensive heart disease with heart failure: Secondary | ICD-10-CM | POA: Diagnosis present

## 2022-08-26 DIAGNOSIS — G4733 Obstructive sleep apnea (adult) (pediatric): Secondary | ICD-10-CM | POA: Diagnosis present

## 2022-08-26 DIAGNOSIS — Z6841 Body Mass Index (BMI) 40.0 and over, adult: Secondary | ICD-10-CM | POA: Diagnosis not present

## 2022-08-26 DIAGNOSIS — N179 Acute kidney failure, unspecified: Secondary | ICD-10-CM | POA: Diagnosis present

## 2022-08-26 DIAGNOSIS — J9601 Acute respiratory failure with hypoxia: Secondary | ICD-10-CM | POA: Diagnosis present

## 2022-08-26 DIAGNOSIS — Z833 Family history of diabetes mellitus: Secondary | ICD-10-CM | POA: Diagnosis not present

## 2022-08-26 DIAGNOSIS — M1990 Unspecified osteoarthritis, unspecified site: Secondary | ICD-10-CM | POA: Diagnosis present

## 2022-08-26 DIAGNOSIS — Z79899 Other long term (current) drug therapy: Secondary | ICD-10-CM | POA: Diagnosis not present

## 2022-08-26 DIAGNOSIS — Z1152 Encounter for screening for COVID-19: Secondary | ICD-10-CM | POA: Diagnosis not present

## 2022-08-26 DIAGNOSIS — M069 Rheumatoid arthritis, unspecified: Secondary | ICD-10-CM | POA: Diagnosis present

## 2022-08-26 DIAGNOSIS — Z8261 Family history of arthritis: Secondary | ICD-10-CM | POA: Diagnosis not present

## 2022-08-26 DIAGNOSIS — Z96612 Presence of left artificial shoulder joint: Secondary | ICD-10-CM | POA: Diagnosis present

## 2022-08-26 DIAGNOSIS — T502X5A Adverse effect of carbonic-anhydrase inhibitors, benzothiadiazides and other diuretics, initial encounter: Secondary | ICD-10-CM | POA: Diagnosis not present

## 2022-08-26 DIAGNOSIS — E1165 Type 2 diabetes mellitus with hyperglycemia: Secondary | ICD-10-CM | POA: Diagnosis present

## 2022-08-26 DIAGNOSIS — Z86718 Personal history of other venous thrombosis and embolism: Secondary | ICD-10-CM | POA: Diagnosis not present

## 2022-08-26 DIAGNOSIS — Z888 Allergy status to other drugs, medicaments and biological substances status: Secondary | ICD-10-CM | POA: Diagnosis not present

## 2022-08-26 DIAGNOSIS — Z981 Arthrodesis status: Secondary | ICD-10-CM | POA: Diagnosis not present

## 2022-08-26 DIAGNOSIS — Z885 Allergy status to narcotic agent status: Secondary | ICD-10-CM | POA: Diagnosis not present

## 2022-08-26 HISTORY — DX: Acute respiratory failure with hypoxia: J96.01

## 2022-08-26 LAB — ECHOCARDIOGRAM COMPLETE
AR max vel: 2.21 cm2
AV Area VTI: 1.71 cm2
AV Area mean vel: 1.94 cm2
AV Mean grad: 1 mmHg
AV Peak grad: 1.8 mmHg
Ao pk vel: 0.68 m/s
Area-P 1/2: 3.91 cm2
Height: 74 in
S' Lateral: 3 cm
Weight: 5408 oz

## 2022-08-26 LAB — TROPONIN I (HIGH SENSITIVITY)
Troponin I (High Sensitivity): 3 ng/L (ref <18)
Troponin I (High Sensitivity): 2 ng/L (ref <18)
Troponin I (High Sensitivity): 2 ng/L (ref <18)

## 2022-08-26 LAB — CBG MONITORING, ED
Glucose-Capillary: 133 mg/dL — ABNORMAL HIGH (ref 70–99)
Glucose-Capillary: 202 mg/dL — ABNORMAL HIGH (ref 70–99)
Glucose-Capillary: 127 mg/dL — ABNORMAL HIGH (ref 70–99)
Glucose-Capillary: 172 mg/dL — ABNORMAL HIGH (ref 70–99)

## 2022-08-26 LAB — RESP PANEL BY RT-PCR (RSV, FLU A&B, COVID)  RVPGX2
Influenza A by PCR: NEGATIVE
Influenza B by PCR: NEGATIVE
Resp Syncytial Virus by PCR: NEGATIVE
SARS Coronavirus 2 by RT PCR: NEGATIVE

## 2022-08-26 MED ORDER — SPIRONOLACTONE 25 MG PO TABS
50.0000 mg | ORAL_TABLET | Freq: Every day | ORAL | Status: DC
  Administered 2022-08-26: 50 mg via ORAL
  Filled 2022-08-26: qty 2

## 2022-08-26 MED ORDER — RIVAROXABAN 15 MG PO TABS: 15.0000 mg | ORAL_TABLET | Freq: Two times a day (BID) | ORAL | Status: DC

## 2022-08-26 MED ORDER — FUROSEMIDE 10 MG/ML IJ SOLN
40.0000 mg | Freq: Two times a day (BID) | INTRAMUSCULAR | Status: DC
  Administered 2022-08-26 – 2022-08-30 (×9): 40 mg via INTRAVENOUS
  Filled 2022-08-26 (×9): qty 4

## 2022-08-26 MED ORDER — INSULIN ASPART 100 UNIT/ML IJ SOLN
0.0000 [IU] | Freq: Three times a day (TID) | INTRAMUSCULAR | Status: DC
  Administered 2022-08-26: 3 [IU] via SUBCUTANEOUS
  Administered 2022-08-26: 7 [IU] via SUBCUTANEOUS
  Administered 2022-08-26 – 2022-08-27 (×2): 3 [IU] via SUBCUTANEOUS
  Administered 2022-08-27 – 2022-08-28 (×3): 4 [IU] via SUBCUTANEOUS
  Administered 2022-08-28: 3 [IU] via SUBCUTANEOUS
  Administered 2022-08-28 – 2022-08-29 (×2): 4 [IU] via SUBCUTANEOUS
  Administered 2022-08-29: 3 [IU] via SUBCUTANEOUS
  Administered 2022-08-29 – 2022-08-30 (×2): 4 [IU] via SUBCUTANEOUS
  Filled 2022-08-26 (×13): qty 1

## 2022-08-26 MED ORDER — BUMETANIDE 0.25 MG/ML IJ SOLN
1.0000 mg | Freq: Once | INTRAMUSCULAR | Status: AC
  Administered 2022-08-26: 1 mg via INTRAVENOUS
  Filled 2022-08-26: qty 4

## 2022-08-26 MED ORDER — ALBUTEROL SULFATE (2.5 MG/3ML) 0.083% IN NEBU
2.5000 mg | INHALATION_SOLUTION | Freq: Once | RESPIRATORY_TRACT | Status: AC
  Administered 2022-08-26: 2.5 mg via RESPIRATORY_TRACT
  Filled 2022-08-26: qty 3

## 2022-08-26 MED ORDER — HYDROXYCHLOROQUINE SULFATE 200 MG PO TABS
200.0000 mg | ORAL_TABLET | Freq: Every day | ORAL | Status: DC
  Administered 2022-08-26 – 2022-08-30 (×5): 200 mg via ORAL
  Filled 2022-08-26 (×6): qty 1

## 2022-08-26 MED ORDER — EMPAGLIFLOZIN 25 MG PO TABS
25.0000 mg | ORAL_TABLET | Freq: Every day | ORAL | Status: DC
  Administered 2022-08-26: 25 mg via ORAL
  Filled 2022-08-26 (×2): qty 1

## 2022-08-26 MED ORDER — ACETAMINOPHEN 650 MG RE SUPP: 650.0000 mg | Freq: Four times a day (QID) | RECTAL | Status: DC | PRN

## 2022-08-26 MED ORDER — MORPHINE SULFATE (PF) 4 MG/ML IV SOLN
4.0000 mg | Freq: Once | INTRAVENOUS | Status: AC
  Administered 2022-08-26: 4 mg via INTRAVENOUS
  Filled 2022-08-26: qty 1

## 2022-08-26 MED ORDER — RIVAROXABAN 15 MG PO TABS
15.0000 mg | ORAL_TABLET | Freq: Two times a day (BID) | ORAL | Status: DC
  Administered 2022-08-26 – 2022-08-30 (×9): 15 mg via ORAL
  Filled 2022-08-26 (×9): qty 1

## 2022-08-26 MED ORDER — RIVAROXABAN 20 MG PO TABS: 20.0000 mg | ORAL_TABLET | Freq: Every day | ORAL | Status: DC

## 2022-08-26 MED ORDER — MORPHINE SULFATE (PF) 2 MG/ML IV SOLN
2.0000 mg | INTRAVENOUS | Status: DC | PRN
  Administered 2022-08-26: 2 mg via INTRAVENOUS
  Filled 2022-08-26: qty 1

## 2022-08-26 MED ORDER — TAMSULOSIN HCL 0.4 MG PO CAPS: 0.4000 mg | ORAL_CAPSULE | Freq: Every day | ORAL | Status: DC

## 2022-08-26 MED ORDER — ACETAMINOPHEN 325 MG PO TABS
650.0000 mg | ORAL_TABLET | Freq: Four times a day (QID) | ORAL | Status: DC | PRN
  Administered 2022-08-26 – 2022-08-27 (×3): 650 mg via ORAL
  Filled 2022-08-26 (×3): qty 2

## 2022-08-26 MED ORDER — HYDROCODONE-ACETAMINOPHEN 5-325 MG PO TABS: 1.0000 | ORAL_TABLET | ORAL | Status: DC | PRN

## 2022-08-26 MED ORDER — ONDANSETRON HCL 4 MG PO TABS: 4.0000 mg | ORAL_TABLET | Freq: Four times a day (QID) | ORAL | Status: DC | PRN

## 2022-08-26 MED ORDER — ONDANSETRON HCL 4 MG/2ML IJ SOLN: 4.0000 mg | Freq: Four times a day (QID) | INTRAMUSCULAR | Status: DC | PRN

## 2022-08-26 MED ORDER — ALBUTEROL SULFATE (2.5 MG/3ML) 0.083% IN NEBU
2.5000 mg | INHALATION_SOLUTION | RESPIRATORY_TRACT | Status: DC | PRN
  Administered 2022-08-26: 2.5 mg via RESPIRATORY_TRACT
  Filled 2022-08-26: qty 3

## 2022-08-26 MED ORDER — ONDANSETRON HCL 4 MG/2ML IJ SOLN
4.0000 mg | Freq: Once | INTRAMUSCULAR | Status: AC
  Administered 2022-08-26: 4 mg via INTRAVENOUS
  Filled 2022-08-26: qty 2

## 2022-08-26 MED ORDER — INSULIN ASPART 100 UNIT/ML IJ SOLN: 0.0000 [IU] | Freq: Every day | INTRAMUSCULAR | Status: DC

## 2022-08-26 NOTE — ED Notes (Signed)
Pt ambulated without oxygen by ed tech, pox per ed tech of 98% on ra. Per ed tech, pt with tachypnea noted on ambulation. Pt in room currently on ra. Pt appears in no acute distress, po fluids provided.

## 2022-08-26 NOTE — ED Notes (Signed)
Report to [place] emt-p and zach, rn.

## 2022-08-26 NOTE — ED Provider Notes (Signed)
[facility] Provider Note    Event Date/Time   First MD Initiated Contact with Patient 08/26/22 0028     (approximate)   History   Chest Pain   HPI  Hector Hancock is a 57 y.o. male brought to the ED via EMS from home with a chief complaint of chest pain.  History of DVTs, recently diagnosed at [facility] and switched from Eliquis to [place].  Reports chest pressure tonight with associated shortness of breath.  Endorses dyspnea on exertion.  Denies associated diaphoresis, palpitations, nausea/vomiting or dizziness.     Past Medical History   Past Medical History:  Diagnosis Date   Arthritis    History of kidney stones    Hypertension      Active Problem List   Patient Active Problem List   Diagnosis Date Noted   OSA on CPAP 10/13/2021   CPAP use counseling 10/13/2021   Morbid obesity ([place]) 10/13/2021   Kidney stone 10/13/2021   Acute exacerbation of CHF (congestive heart failure) ([place]) 05/30/2021   Deep vein thrombosis (DVT) of left lower extremity ([place]) 05/30/2021   Heart failure with preserved ejection fraction ([place]) 05/30/2021   Shortness of breath 04/11/2021   Bilateral pulmonary embolism ([place]) 04/04/2021   Pain and swelling of left lower extremity 04/04/2021   DOE (dyspnea on exertion) 04/04/2021   Benign essential HTN 03/25/2021   SOBOE (shortness of breath on exertion) 03/25/2021   Snoring 02/24/2021   Rheumatoid arthritis involving both hands with positive rheumatoid factor ([place]) 04/19/2020   Class 3 severe obesity due to excess calories with serious comorbidity and body mass index (BMI) of 45.0 to 49.9 in adult [facility]) 04/19/2020   History of nephrolithiasis 04/19/2020   Hypertension    Pain of right hand 06/17/2017   Special screening for malignant neoplasms, colon      Past Surgical History   Past Surgical History:  Procedure Laterality Date   COLONOSCOPY WITH PROPOFOL N/A 12/27/2015   Procedure: COLONOSCOPY WITH PROPOFOL;   Surgeon: Lucilla Lame, MD;  Location: [place];  Service: Endoscopy;  Laterality: N/A;   EXTRACORPOREAL SHOCK WAVE LITHOTRIPSY Left 07/28/2018   Procedure: EXTRACORPOREAL SHOCK WAVE LITHOTRIPSY (ESWL);  Surgeon: Royston Cowper, MD;  Location: [facility];  Service: Urology;  Laterality: Left;   EXTRACORPOREAL SHOCK WAVE LITHOTRIPSY Right 01/04/2020   Procedure: EXTRACORPOREAL SHOCK WAVE LITHOTRIPSY (ESWL);  Surgeon: Royston Cowper, MD;  Location: [facility];  Service: Urology;  Laterality: Right;   POLYPECTOMY  12/27/2015   Procedure: POLYPECTOMY;  Surgeon: Lucilla Lame, MD;  Location: [place];  Service: Endoscopy;;   SACROILIAC JOINT FUSION Right 09/06/2013   Procedure: SACROILIAC JOINT FUSION;  Surgeon: Sinclair Ship, MD;  Location: [place];  Service: Orthopedics;  Laterality: Right;  Right sided sacroiliac joint fusion   TOTAL SHOULDER ARTHROPLASTY Left    WRIST SURGERY Left      Home Medications   Prior to Admission medications   Medication Sig Start Date End Date Taking? Authorizing Provider  apixaban (ELIQUIS) 5 MG TABS tablet Take by mouth. 06/25/21   [provider]  diphenhydrAMINE (BENADRYL) 25 mg capsule Take by mouth.    [provider]  ELIQUIS 5 MG TABS tablet Take by mouth 2 (two) times daily. 04/08/21   [provider]  enalapril (VASOTEC) 5 MG tablet TAKE 1 TABLET (5 MG TOTAL) BY MOUTH DAILY. 08/20/21   Cletis Athens, MD  Fluticasone-Umeclidin-Vilant (TRELEGY ELLIPTA) 100-62.5-25 MCG/ACT AEPB Inhale 1 puff  into the lungs daily. 06/05/21   Allyne Gee, MD  gabapentin (NEURONTIN) 300 MG capsule Take by mouth. Take 3 capsules at bedtime 05/30/21 06/29/21  [provider]  hydroxychloroquine (PLAQUENIL) 200 MG tablet Take 200 mg by mouth daily.    [provider]  JARDIANCE 10 MG TABS tablet Take 10 mg by mouth daily. 09/18/21   [provider]  spironolactone (ALDACTONE) 25 MG tablet Take 25 mg by  mouth daily. 05/30/21   [provider]  spironolactone (ALDACTONE) 50 MG tablet Take by mouth. 08/20/21   [provider]  tamsulosin (FLOMAX) 0.4 MG CAPS capsule Take 0.4 mg by mouth daily. 06/24/21   [provider]  torsemide (DEMADEX) 20 MG tablet Take 20 mg by mouth daily. 10/12/21   [provider]     Allergies  Codeine and Prednisone   Family History   Family History  Problem Relation Age of Onset   Diabetes Mother    Arthritis Father    Diabetes Sister    Diabetes Sister      Physical Exam  Triage Vital Signs: ED Triage Vitals  Enc Vitals Group     BP 08/25/22 2153 102/84     Pulse Rate 08/25/22 2153 88     Resp 08/25/22 2153 (!) 26     Temp 08/25/22 2153 98 F (36.7 C)     Temp Source 08/25/22 2153 Oral     SpO2 08/25/22 2153 96 %     Weight 08/25/22 2151 (!) 338 lb (153.3 kg)     Height 08/25/22 2151 6\' 2"  (1.88 m)     Head Circumference --      Peak Flow --      Pain Score 08/25/22 2151 7     Pain Loc --      Pain Edu? --      Excl. in [place]? --     Updated Vital Signs: BP (!) 106/57   Pulse 77   Temp 98 F (36.7 C) (Oral)   Resp (!) 27   Ht 6\' 2"  (1.88 m)   Wt (!) 153.3 kg   SpO2 98%   BMI 43.40 kg/m    General: Awake, no distress.  CV:  RRR.  Good peripheral perfusion.  Resp:  Mildly increased effort.  Diminished, otherwise CTAB. Abd:  Nontender.  No distention.  Other:  BLE baseline edema.   ED Results / Procedures / Treatments  Labs (all labs ordered are listed, but only abnormal results are displayed) Labs Reviewed  BASIC METABOLIC PANEL - Abnormal; Notable for the following components:      Result Value   Sodium 134 (*)    CO2 21 (*)    Glucose, Bld 229 (*)    BUN 26 (*)    Creatinine, Ser 1.78 (*)    Calcium 8.8 (*)    GFR, Estimated 44 (*)    All other components within normal limits  CBC - Abnormal; Notable for the following components:   WBC 11.7 (*)    All other components within normal  limits  RESP PANEL BY RT-PCR (RSV, FLU A&B, COVID)  RVPGX2  BRAIN NATRIURETIC PEPTIDE  TROPONIN I (HIGH SENSITIVITY)  TROPONIN I (HIGH SENSITIVITY)     EKG  ED ECG REPORT I, Shakeyla Giebler J, the attending physician, personally viewed and interpreted this ECG.   Date: 08/26/2022  EKG Time: 2154  Rate: 94  Rhythm: normal sinus rhythm  Axis: Normal  Intervals:none  ST&T Change: Nonspecific  RADIOLOGY I have independently visualized and interpreted patient's chest x-ray and CT scan as well as noted the radiology interpretation:  Chest x-ray: No acute cardiopulmonary process  CTA chest: No PE, patchy groundglass bibasilar opacities  Official radiology report(s): CT Angio Chest PE W and/or Wo Contrast  Result Date: 08/25/2022 CLINICAL DATA:  Syncope. EXAM: CT ANGIOGRAPHY CHEST WITH CONTRAST TECHNIQUE: Multidetector CT imaging of the chest was performed using the standard protocol during bolus administration of intravenous contrast. Multiplanar CT image reconstructions and MIPs were obtained to evaluate the vascular anatomy. RADIATION DOSE REDUCTION: This exam was performed according to the departmental dose-optimization program which includes automated exposure control, adjustment of the mA and/or kV according to patient size and/or use of iterative reconstruction technique. CONTRAST:  74mL OMNIPAQUE IOHEXOL 350 MG/ML SOLN COMPARISON:  Chest CT 05/03/2006 FINDINGS: Cardiovascular: Satisfactory opacification of the pulmonary arteries to the segmental level. No evidence of pulmonary embolism. Normal heart size. No pericardial effusion. Mediastinum/Nodes: No enlarged mediastinal, hilar, or axillary lymph nodes. Thyroid gland, trachea, and esophagus demonstrate no significant findings. Lungs/Pleura: There are minimal patchy ground-glass opacities bilaterally. There is no lung consolidation, pleural effusion or pneumothorax. Upper Abdomen: No acute abnormality. Musculoskeletal: Left shoulder  arthroplasty is present. No focal osseous lesion. Review of the MIP images confirms the above findings. IMPRESSION: 1. No evidence for pulmonary embolism. 2. Minimal patchy ground-glass opacities bilaterally, likely infectious/inflammatory. Electronically Signed   By: Darliss Cheney M.D.   On: 08/25/2022 23:16   DG Chest 1 View  Result Date: 08/25/2022 CLINICAL DATA:  Chest pain EXAM: CHEST  1 VIEW COMPARISON:  04/01/2021 FINDINGS: Borderline cardiomegaly. Aortic atherosclerosis. No acute airspace disease, pleural effusion or pneumothorax. IMPRESSION: No active disease. Borderline to mild cardiomegaly. Electronically Signed   By: Jasmine Pang M.D.   On: 08/25/2022 22:42     PROCEDURES:  Critical Care performed: Yes, see critical care procedure note(s)  CRITICAL CARE Performed by: Irean Hong   Total critical care time: 30 minutes  Critical care time was exclusive of separately billable procedures and treating other patients.  Critical care was necessary to treat or prevent imminent or life-threatening deterioration.  Critical care was time spent personally by me on the following activities: development of treatment plan with patient and/or surrogate as well as nursing, discussions with consultants, evaluation of patient's response to treatment, examination of patient, obtaining history from patient or surrogate, ordering and performing treatments and interventions, ordering and review of laboratory studies, ordering and review of radiographic studies, pulse oximetry and re-evaluation of patient's condition.   Marland Kitchen1-3 Lead EKG Interpretation  Performed by: Irean Hong, MD Authorized by: Irean Hong, MD     Interpretation: normal     ECG rate:  85   ECG rate assessment: normal     Rhythm: sinus rhythm     Ectopy: none     Conduction: normal   Comments:     Patient placed on cardiac monitor to evaluate for arrhythmias    MEDICATIONS ORDERED IN ED: Medications  bumetanide (BUMEX)  injection 1 mg (has no administration in time range)  iohexol (OMNIPAQUE) 350 MG/ML injection 75 mL (75 mLs Intravenous Contrast Given 08/25/22 2253)  morphine (PF) 4 MG/ML injection 4 mg (4 mg Intravenous Given 08/26/22 0208)  ondansetron (ZOFRAN) injection 4 mg (4 mg Intravenous Given 08/26/22 0208)  albuterol (PROVENTIL) (2.5 MG/3ML) 0.083% nebulizer solution 2.5 mg (2.5 mg Nebulization Given 08/26/22 0209)     IMPRESSION / MDM / ASSESSMENT AND PLAN /  ED COURSE  I reviewed the triage vital signs and the nursing notes.                             57 year old male presenting with chest pain, history of DVTs. Differential diagnosis includes, but is not limited to, ACS, aortic dissection, pulmonary embolism, cardiac tamponade, pneumothorax, pneumonia, pericarditis, myocarditis, GI-related causes including esophagitis/gastritis, and musculoskeletal chest wall pain.   I have personally reviewed patient's records and note his [facility] observation visit from 08/20/2022.  Patient's presentation is most consistent with acute presentation with potential threat to life or bodily function.  The patient is on the cardiac monitor to evaluate for evidence of arrhythmia and/or significant heart rate changes.  Laboratory results demonstrate mild leukocytosis WBC 11.7, AKI creatinine 1.78 increased from baseline, two sets of negative troponins, negative BNP.  Chest x-ray unremarkable.  CTA chest negative for PE but suspicious for groundglass opacities.  Will obtain respiratory panel.  Clinical Course as of 08/26/22 0250  Wed Aug 26, 2022  0249 Patient maintained room air saturations at 98% on ambulation.  However, he was tachypneic to the 30s.  Received albuterol nebulizer.  Subsequently moving around in the bed patient's room air saturations dropped to 86%, placed on 2 L nasal cannula oxygen.  Will administer 1 mg IV for Bumex and consult hospitalist services for evaluation and admission. [JS]    Clinical Course User  Index [JS] Irean Hong, MD     FINAL CLINICAL IMPRESSION(S) / ED DIAGNOSES   Final diagnoses:  Nonspecific chest pain  SOB (shortness of breath)  Hypoxia  DOE (dyspnea on exertion)     Rx / DC Orders   ED Discharge Orders     None        Note:  This document was prepared using Dragon voice recognition software and may include unintentional dictation errors.   Irean Hong, MD 08/26/22 [phone]

## 2022-08-26 NOTE — Progress Notes (Incomplete)
  Echocardiogram 2D Echocardiogram has been performed.  Hector Hancock 08/26/2022, 10:19 AM

## 2022-08-26 NOTE — Assessment & Plan Note (Signed)
Complicating factor to overall prognosis and care 

## 2022-08-26 NOTE — Assessment & Plan Note (Signed)
CPAP nightly Patient follows with pulmonology

## 2022-08-26 NOTE — ED Notes (Signed)
Report received from Jessica, RN.

## 2022-08-26 NOTE — H&P (Signed)
History and Physical    Patient: Hector Hancock NAT:[id number] DOB: 07-21-1966 DOA: 08/25/2022 DOS: the patient was seen and examined on 08/26/2022 PCP: Sallyanne Kuster, NP  Patient coming from: Home  Chief Complaint:  Chief Complaint  Patient presents with   Chest Pain    HPI: Hector Hancock is a 57 y.o. male with medical history significant for HFpEF, class III obesity, type 2 diabetes, restrictive lung disease secondary to obesity, OSA followed by pulmonology, history of LLE DVT 03/2021 on Eliquis, which was switched to Xarelto during her recent hospitalization at [facility] from 1/4 to 1/6 when he presented with left lower extremity swelling and was diagnosed with a left femoral vein DVT who now presents to the ED with dyspnea on exertion.  At his ED visit to [facility] on 1/4 he was noted to be volume overloaded and he was treated with IV Lasix and his home torsemide dose was increased and spironolactone was added. Patient also reports chest pressure but denies nausea, vomiting or diaphoresis and denies lightheadedness or palpitations.  Has no cough, fever or chills. ED course and data review: Vitals notable for soft blood pressure, tachypnea to 27 with O2 sat 87% on room air and otherwise normal vitals.  Troponin 3 and BNP 24.  WBC 11,700.  Respiratory viral panel negative.  BMP significant forcreatinine of 1 8 up from baseline of 1.05 and glucose 229. EKG personally viewed and interpreted showing NSR at 94 with no acute ST-T wave changes.  CTA chest negative for PE and showing minimal patchy groundglass opacities bilaterally, likely infectious/inflammatory.  Patient was treated with IV Bumex albuterol and hospitalist consulted for admission.   Review of Systems: As mentioned in the history of present illness. All other systems reviewed and are negative.  Past Medical History:  Diagnosis Date   Arthritis    History of kidney stones    Hypertension    Past Surgical History:  Procedure Laterality Date    COLONOSCOPY WITH PROPOFOL N/A 12/27/2015   Procedure: COLONOSCOPY WITH PROPOFOL;  Surgeon: Midge Minium, MD;  Location: [facility];  Service: Endoscopy;  Laterality: N/A;   EXTRACORPOREAL SHOCK WAVE LITHOTRIPSY Left 07/28/2018   Procedure: EXTRACORPOREAL SHOCK WAVE LITHOTRIPSY (ESWL);  Surgeon: Orson Ape, MD;  Location: [facility] ORS;  Service: Urology;  Laterality: Left;   EXTRACORPOREAL SHOCK WAVE LITHOTRIPSY Right 01/04/2020   Procedure: EXTRACORPOREAL SHOCK WAVE LITHOTRIPSY (ESWL);  Surgeon: Orson Ape, MD;  Location: [facility] ORS;  Service: Urology;  Laterality: Right;   POLYPECTOMY  12/27/2015   Procedure: POLYPECTOMY;  Surgeon: Midge Minium, MD;  Location: [facility];  Service: Endoscopy;;   SACROILIAC JOINT FUSION Right 09/06/2013   Procedure: SACROILIAC JOINT FUSION;  Surgeon: Emilee Hero, MD;  Location: [facility] OR;  Service: Orthopedics;  Laterality: Right;  Right sided sacroiliac joint fusion   TOTAL SHOULDER ARTHROPLASTY Left    WRIST SURGERY Left    Social History:  reports that he has never smoked. He has never used smokeless tobacco. He reports that he does not drink alcohol and does not use drugs.  Allergies  Allergen Reactions   Codeine Itching   Prednisone Other (See Comments)    irritable    Family History  Problem Relation Age of Onset   Diabetes Mother    Arthritis Father    Diabetes Sister    Diabetes Sister     Prior to Admission medications   Medication Sig Start Date End Date Taking? Authorizing Provider  apixaban Everlene Balls)  5 MG TABS tablet Take by mouth. 06/25/21   [provider]  diphenhydrAMINE (BENADRYL) 25 mg capsule Take by mouth.    [provider]  ELIQUIS 5 MG TABS tablet Take by mouth 2 (two) times daily. 04/08/21   [provider]  enalapril (VASOTEC) 5 MG tablet TAKE 1 TABLET (5 MG TOTAL) BY MOUTH DAILY. 08/20/21   Cletis Athens, MD  Fluticasone-Umeclidin-Vilant (TRELEGY ELLIPTA) 100-62.5-25  MCG/ACT AEPB Inhale 1 puff into the lungs daily. 06/05/21   Allyne Gee, MD  gabapentin (NEURONTIN) 300 MG capsule Take by mouth. Take 3 capsules at bedtime 05/30/21 06/29/21  [provider]  hydroxychloroquine (PLAQUENIL) 200 MG tablet Take 200 mg by mouth daily.    [provider]  JARDIANCE 10 MG TABS tablet Take 10 mg by mouth daily. 09/18/21   [provider]  spironolactone (ALDACTONE) 25 MG tablet Take 25 mg by mouth daily. 05/30/21   [provider]  spironolactone (ALDACTONE) 50 MG tablet Take by mouth. 08/20/21   [provider]  tamsulosin (FLOMAX) 0.4 MG CAPS capsule Take 0.4 mg by mouth daily. 06/24/21   [provider]  torsemide (DEMADEX) 20 MG tablet Take 20 mg by mouth daily. 10/12/21   [provider]    Physical Exam: Vitals:   08/26/22 0223 08/26/22 0226 08/26/22 0227 08/26/22 0300  BP:    113/74  Pulse: 85 83 77 83  Resp: (!) 23 (!) 23 (!) 27 13  Temp:      TempSrc:      SpO2: (!) 87% (!) 87% 98% 98%  Weight:      Height:       Physical Exam Vitals and nursing note reviewed.  Constitutional:      General: He is not in acute distress. HENT:     Head: Normocephalic and atraumatic.  Cardiovascular:     Rate and Rhythm: Normal rate and regular rhythm.     Heart sounds: Normal heart sounds.  Pulmonary:     Effort: Tachypnea present.     Breath sounds: Normal breath sounds.  Abdominal:     Palpations: Abdomen is soft.     Tenderness: There is no abdominal tenderness.  Musculoskeletal:     Right lower leg: Edema present.     Left lower leg: Edema present.  Neurological:     Mental Status: Mental status is at baseline.     Labs on Admission: I have personally reviewed following labs and imaging studies  CBC: Recent Labs  Lab 08/25/22 2156  WBC 11.7*  HGB 15.6  HCT 48.1  MCV 89.4  PLT 99991111   Basic Metabolic Panel: Recent Labs  Lab 08/25/22 2156  NA 134*  K 4.4  CL 101  CO2 21*   GLUCOSE 229*  BUN 26*  CREATININE 1.78*  CALCIUM 8.8*   GFR: Estimated Creatinine Clearance: 70.8 mL/min (A) (by C-G formula based on SCr of 1.78 mg/dL (H)). Liver Function Tests: No results for input(s): "AST", "ALT", "ALKPHOS", "BILITOT", "PROT", "ALBUMIN" in the last 168 hours. No results for input(s): "LIPASE", "AMYLASE" in the last 168 hours. No results for input(s): "AMMONIA" in the last 168 hours. Coagulation Profile: No results for input(s): "INR", "PROTIME" in the last 168 hours. Cardiac Enzymes: No results for input(s): "CKTOTAL", "CKMB", "CKMBINDEX", "TROPONINI" in the last 168 hours. BNP (last 3 results) No results for input(s): "PROBNP" in the last 8760 hours. HbA1C: No results for input(s): "HGBA1C" in the last 72 hours. CBG: No results  for input(s): "GLUCAP" in the last 168 hours. Lipid Profile: No results for input(s): "CHOL", "HDL", "LDLCALC", "TRIG", "CHOLHDL", "LDLDIRECT" in the last 72 hours. Thyroid Function Tests: No results for input(s): "TSH", "T4TOTAL", "FREET4", "T3FREE", "THYROIDAB" in the last 72 hours. Anemia Panel: No results for input(s): "VITAMINB12", "FOLATE", "FERRITIN", "TIBC", "IRON", "RETICCTPCT" in the last 72 hours. Urine analysis:    Component Value Date/Time   COLORURINE Yellow 06/18/2014 1612   COLORURINE YELLOW 09/05/2013 0846   APPEARANCEUR Clear 06/18/2014 1612   LABSPEC 1.021 06/18/2014 1612   PHURINE 6.0 06/18/2014 1612   PHURINE 5.5 09/05/2013 0846   GLUCOSEU Negative 06/18/2014 1612   HGBUR Negative 06/18/2014 1612   HGBUR NEGATIVE 09/05/2013 0846   BILIRUBINUR Negative 06/18/2014 1612   KETONESUR Negative 06/18/2014 [place] 09/05/2013 0846   PROTEINUR Negative 06/18/2014 1612   PROTEINUR NEGATIVE 09/05/2013 0846   UROBILINOGEN 0.2 09/05/2013 0846   NITRITE Negative 06/18/2014 1612   NITRITE NEGATIVE 09/05/2013 0846   LEUKOCYTESUR Negative 06/18/2014 1612    Radiological Exams on Admission: CT  Angio Chest PE W and/or Wo Contrast  Result Date: 08/25/2022 CLINICAL DATA:  Syncope. EXAM: CT ANGIOGRAPHY CHEST WITH CONTRAST TECHNIQUE: Multidetector CT imaging of the chest was performed using the standard protocol during bolus administration of intravenous contrast. Multiplanar CT image reconstructions and MIPs were obtained to evaluate the vascular anatomy. RADIATION DOSE REDUCTION: This exam was performed according to the departmental dose-optimization program which includes automated exposure control, adjustment of the mA and/or kV according to patient size and/or use of iterative reconstruction technique. CONTRAST:  54mL OMNIPAQUE IOHEXOL 350 MG/ML SOLN COMPARISON:  Chest CT 05/03/2006 FINDINGS: Cardiovascular: Satisfactory opacification of the pulmonary arteries to the segmental level. No evidence of pulmonary embolism. Normal heart size. No pericardial effusion. Mediastinum/Nodes: No enlarged mediastinal, hilar, or axillary lymph nodes. Thyroid gland, trachea, and esophagus demonstrate no significant findings. Lungs/Pleura: There are minimal patchy ground-glass opacities bilaterally. There is no lung consolidation, pleural effusion or pneumothorax. Upper Abdomen: No acute abnormality. Musculoskeletal: Left shoulder arthroplasty is present. No focal osseous lesion. Review of the MIP images confirms the above findings. IMPRESSION: 1. No evidence for pulmonary embolism. 2. Minimal patchy ground-glass opacities bilaterally, likely infectious/inflammatory. Electronically Signed   By: Ronney Asters M.D.   On: 08/25/2022 23:16   DG Chest 1 View  Result Date: 08/25/2022 CLINICAL DATA:  Chest pain EXAM: CHEST  1 VIEW COMPARISON:  04/01/2021 FINDINGS: Borderline cardiomegaly. Aortic atherosclerosis. No acute airspace disease, pleural effusion or pneumothorax. IMPRESSION: No active disease. Borderline to mild cardiomegaly. Electronically Signed   By: Donavan Foil M.D.   On: 08/25/2022 22:42     Data  Reviewed: Relevant notes from primary care and specialist visits, past discharge summaries as available in EHR, including Care Everywhere. Prior diagnostic testing as pertinent to current admission diagnoses Updated medications and problem lists for reconciliation ED course, including vitals, labs, imaging, treatment and response to treatment Triage notes, nursing and pharmacy notes and ED provider's notes Notable results as noted in HPI   Assessment and Plan: * Acute on chronic diastolic CHF (congestive heart failure) (HCC) Acute respiratory failure with hypoxia CTA negative for PE Patient with dyspnea on exertion.  BNP 24 but could be related to obesity.  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  Avoid nephrotoxins  Uncontrolled type 2 diabetes mellitus with hyperglycemia, without long-term current use of insulin (HCC) Continue Jardiance Sliding scale coverage  OSA on CPAP CPAP nightly Patient follows with pulmonology  Class 3 severe obesity due to excess calories with serious comorbidity and body mass index (BMI) of 45.0 to 49.9 in adult [facility]) Complicating factor to overall prognosis and care  Rheumatoid arthritis involving both hands with positive rheumatoid factor (HCC) Continue Plaquenil  Hypertension Blood pressure was somewhat soft Will hold enalapril Continue spironolactone        DVT  prophylaxis: Xarelto  Consults: none  Advance Care Planning: full code  Family Communication: none  Disposition Plan: Back to previous home environment  Severity of Illness: The appropriate patient status for this patient is INPATIENT. Inpatient status is judged to be reasonable and necessary in order to provide the required intensity of service to ensure the patient's safety. The patient's presenting symptoms, physical exam findings, and initial radiographic and laboratory data in the context of their chronic comorbidities is felt to place them at high risk for further clinical deterioration. Furthermore, it is not anticipated that the patient will be medically stable for discharge from the hospital within 2 midnights of admission.   * I certify that at the point of admission it is my clinical judgment that the patient will require inpatient hospital care spanning beyond 2 midnights from the point of admission due to high intensity of service, high risk for further deterioration and high frequency of surveillance required.*  Author: Athena Masse, MD 08/26/2022 3:24 AM  For on call review [email or web address].

## 2022-08-26 NOTE — Assessment & Plan Note (Addendum)
Patient is on lifelong anticoagulation and follows with hematology Recently switched from Eliquis to [place] on 08/20/2022 after having a femoral DVT while on Eliquis CTA chest negative for PE Continue Xarelto NM perfusion study today

## 2022-08-26 NOTE — ED Notes (Signed)
Oxygen turned off at this time, WCTM.

## 2022-08-26 NOTE — Progress Notes (Signed)
Brief rounding note, same day as admission  HPI: Admitted after midnight started on IV lasix for decompensated CHF.  Interval history: Continues to be very dyspneic with exertion.  O2 sats dropped into 80's on room air with ambulation.  Exam: General exam: awake, alert, no acute distress, obese HEENT: moist mucus membranes, hearing grossly normal  Respiratory system: CTAB diminished due to body habitus, no wheezes or rhonchi, normal respiratory effort at rest, on 2 L/min [place] o2. Cardiovascular system: normal S1/S2, RRR, 1+ BLE edema.   Gastrointestinal system: soft, NT, ND, no HSM felt, +bowel sounds. Central nervous system: A&O x3. no gross focal neurologic deficits, normal speech Extremities: moves all, normal tone Skin: dry, intact, normal temperature Psychiatry: normal mood, congruent affect, judgement and insight appear normal   A&P: as per H&P by Dr. Damita Dunnings, with any changes or additions as below:   --Echo completed, report still pending --Hold spironolactone and Jardiance given renal function and needing diuresis --Continue diuresis --BMP in AM --Will consider cardiology consult if warranted once echo results are back   No charge

## 2022-08-26 NOTE — Assessment & Plan Note (Addendum)
Resolved with diuresis. POA with Cr 1.78 (baseline around 0.9-1.1. Renal function improving with diuresis. Monitor BMP. Avoid nephrotoxins.

## 2022-08-26 NOTE — Assessment & Plan Note (Addendum)
Blood pressure was somewhat soft on admission and with diuresis --Hold enalapril --Resume spironolactone --Continue IV lasix

## 2022-08-26 NOTE — Assessment & Plan Note (Signed)
-   Continue Plaquenil 

## 2022-08-26 NOTE — ED Notes (Signed)
Pt with decreased pox and increased resps with exertion, pt placed back on oxygen at 2lpm with rebound pox after two minutes to 98%.

## 2022-08-26 NOTE — Assessment & Plan Note (Addendum)
Acute respiratory failure with hypoxia CTA negative for PE.   Patient with dyspnea on exertion.  BNP 24 but could be falsely low due to obesity.   Last known EF 60 to 65% 09/2021 EF unchanged this admission. Recently treated for fluid overload at [facility] from 1/4 to 1/6 with increase in home torsemide dose --Wt improving and still high UO with 3800 cc out yesterday --No inputs charted, unclear net fluid balance --Ongoing severe DOE and exertional hypoxia --Continue IV Lasix --Consulted cardiology --Continue home enalapril, spironolactone --Daily weights with intake and output monitoring --Monitor renal function & electrolytes

## 2022-08-26 NOTE — Assessment & Plan Note (Signed)
Continue Jardiance Sliding scale coverage

## 2022-08-26 NOTE — ED Notes (Signed)
Pt ambulatory independently in room on room air, pt very short of breath with exertion, oxygen saturation 89% room air with ambulation. Pt back into bed, placed on 2L [place] and tolerating well. WCTM.

## 2022-08-27 DIAGNOSIS — R519 Headache, unspecified: Secondary | ICD-10-CM | POA: Clinically undetermined

## 2022-08-27 DIAGNOSIS — I5033 Acute on chronic diastolic (congestive) heart failure: Secondary | ICD-10-CM | POA: Diagnosis not present

## 2022-08-27 LAB — BASIC METABOLIC PANEL
Anion gap: 12 (ref 5–15)
BUN: 25 mg/dL — ABNORMAL HIGH (ref 6–20)
CO2: 24 mmol/L (ref 22–32)
Calcium: 9 mg/dL (ref 8.9–10.3)
Chloride: 98 mmol/L (ref 98–111)
Creatinine, Ser: 1.31 mg/dL — ABNORMAL HIGH (ref 0.61–1.24)
GFR, Estimated: >60 mL/min (ref >60)
Glucose, Bld: 140 mg/dL — ABNORMAL HIGH (ref 70–99)
Potassium: 4.2 mmol/L (ref 3.5–5.1)
Sodium: 134 mmol/L — ABNORMAL LOW (ref 135–145)

## 2022-08-27 LAB — CBG MONITORING, ED
Glucose-Capillary: 156 mg/dL — ABNORMAL HIGH (ref 70–99)
Glucose-Capillary: 149 mg/dL — ABNORMAL HIGH (ref 70–99)
Glucose-Capillary: 159 mg/dL — ABNORMAL HIGH (ref 70–99)
Glucose-Capillary: 171 mg/dL — ABNORMAL HIGH (ref 70–99)

## 2022-08-27 LAB — HIV ANTIBODY (ROUTINE TESTING W REFLEX): HIV Screen 4th Generation wRfx: NONREACTIVE

## 2022-08-27 MED ORDER — BUTALBITAL-APAP-CAFFEINE 50-325-40 MG PO TABS
1.0000 | ORAL_TABLET | Freq: Four times a day (QID) | ORAL | Status: DC | PRN
  Administered 2022-08-27 – 2022-08-28 (×2): 1 via ORAL
  Filled 2022-08-27 (×2): qty 1

## 2022-08-27 NOTE — Hospital Course (Addendum)
HPI on admission, per Dr. Damita Dunnings: "Hector Hancock is a 57 y.o. male with medical history significant for HFpEF, class III obesity, type 2 diabetes, restrictive lung disease secondary to obesity, OSA followed by pulmonology, history of LLE DVT 03/2021 on Eliquis, which was switched to Xarelto during her recent hospitalization at [facility] from 1/4 to 1/6 when he presented with left lower extremity swelling and was diagnosed with a left femoral vein DVT who now presents to the ED with dyspnea on exertion.  At his ED visit to [facility] on 1/4 he was noted to be volume overloaded and he was treated with IV Lasix and his home torsemide dose was increased and spironolactone was added. Patient also reports chest pressure but denies nausea, vomiting or diaphoresis and denies lightheadedness or palpitations.  Has no cough, fever or chills. ED course and data review: Vitals notable for soft blood pressure, tachypnea to 27 with O2 sat 87% on room air and otherwise normal vitals.  Troponin 3 and BNP 24.  WBC 11,700.  Respiratory viral panel negative.  BMP significant forcreatinine of 1 8 up from baseline of 1.05 and glucose 229. EKG personally viewed and interpreted showing NSR at 94 with no acute ST-T wave changes.  CTA chest negative for PE and showing minimal patchy groundglass opacities bilaterally, likely infectious/inflammatory.  Patient was treated with IV Bumex albuterol and hospitalist consulted for admission. "  Started on diuresis with IV Lasix 40 mg BID.  Echocardiogram showed normal LVEF 25-05%, normal diastolic parameters, mild LVH, moderate LA dilation, mild RA dilation.  1/10 - ongoing dyspnea, remains with lower extremity edema, O2 desats on room air.  1/11 - improving, edema nearly resolved.  Still hypoxic when tried on room air today.  Renal function improving. 1/12 - ongoing dyspnea with minimal exertion, O2 still drops with any exertion. Continue diuresis.  Cardiology consulted.

## 2022-08-27 NOTE — Progress Notes (Signed)
Progress Note   Patient: Hector Hancock QMV:[id number] DOB: 14-Aug-1966 DOA: 08/25/2022     1 DOS: the patient was seen and examined on 08/27/2022   Brief hospital course: HPI on admission, per Dr. Damita Dunnings: "Hector Hancock is a 57 y.o. male with medical history significant for HFpEF, class III obesity, type 2 diabetes, restrictive lung disease secondary to obesity, OSA followed by pulmonology, history of LLE DVT 03/2021 on Eliquis, which was switched to Xarelto during her recent hospitalization at [facility] from 1/4 to 1/6 when he presented with left lower extremity swelling and was diagnosed with a left femoral vein DVT who now presents to the ED with dyspnea on exertion.  At his ED visit to [facility] on 1/4 he was noted to be volume overloaded and he was treated with IV Lasix and his home torsemide dose was increased and spironolactone was added. Patient also reports chest pressure but denies nausea, vomiting or diaphoresis and denies lightheadedness or palpitations.  Has no cough, fever or chills. ED course and data review: Vitals notable for soft blood pressure, tachypnea to 27 with O2 sat 87% on room air and otherwise normal vitals.  Troponin 3 and BNP 24.  WBC 11,700.  Respiratory viral panel negative.  BMP significant forcreatinine of 1 8 up from baseline of 1.05 and glucose 229. EKG personally viewed and interpreted showing NSR at 94 with no acute ST-T wave changes.  CTA chest negative for PE and showing minimal patchy groundglass opacities bilaterally, likely infectious/inflammatory.  Patient was treated with IV Bumex albuterol and hospitalist consulted for admission. "  He is being treated with IV Lasix 40 mg BID.  Echocardiogram showed normal LVEF 28-41%, normal diastolic parameters, mild LVH, moderate LA dilation, mild RA dilation.  1/10 - ongoing dyspnea, remains with lower extremity edema, O2 desats on room air.   1/11 - improving, edema nearly resolved.  Still hypoxic when tried on room air today.   Renal function improving.  Assessment and Plan: * Acute on chronic diastolic CHF (congestive heart failure) (HCC) Acute respiratory failure with hypoxia CTA negative for PE.   Patient with dyspnea on exertion.  BNP 24 but could be falsely low due to obesity.   Last known EF 60 to 65% 09/2021 EF unchanged this admission. Recently treated for fluid overload at [facility] from 1/4 to 1/6 with increase in home torsemide dose --Continue IV Lasix --Continue home enalapril, spironolactone --Daily weights with intake and output monitoring --Monitor renal function & electrolytes  History of PE and recurrent deep vein thrombosis (DVT) Patient is on lifelong anticoagulation and follows with hematology Recently switched from Eliquis to Xarelto on 08/20/2022 after having a femoral DVT while on Eliquis CTA chest negative for PE Continue Xarelto  AKI (acute kidney injury) ([place]) POA with Cr 1.78 (baseline around 0.9-1.1. Renal function improving with diuresis. Monitor BMP. Avoid nephrotoxins.  Uncontrolled type 2 diabetes mellitus with hyperglycemia, without long-term current use of insulin (HCC) Continue Jardiance Sliding scale coverage  Headache Unclear etiology.  He has congestion, possibly a viral illness. Negative for Covid, FluA/B and RSV. Tylenol or Fioricet PRN  Acute respiratory failure with hypoxia (HCC) O2 sats drop into mid 80's on room air. Continuing diuresis. Wean O2 as tolerated, goal spO2 90-94%  OSA on CPAP CPAP nightly Patient follows with pulmonology  Class 3 severe obesity due to excess calories with serious comorbidity and body mass index (BMI) of 45.0 to 49.9 in adult [facility]) Body mass index is 43.4 kg/m. Complicates overall  care and prognosis.  Recommend lifestyle modifications including physical activity and diet for weight loss and overall long-term health.   Rheumatoid arthritis involving both hands with positive rheumatoid factor (HCC) Continue  Plaquenil  Hypertension Blood pressure was somewhat soft on admission --Hold enalapril, spironolactone --On IV lasix        Subjective: Pt seen in ED still holding for a bed this morning.  He reports feeling better but has a headache again.  Shortness of breath improving, still SOB on exertion.  Tried on room air and noted to desat into 80's.  Denies fever/chills, sore throat.  Denies significant cough.  Hopes to [facility]ancock home soon.  Physical Exam: Vitals:   08/27/22 1109 08/27/22 1111 08/27/22 1151 08/27/22 1300  BP:   114/80 110/82  Pulse: 87 86 88 85  Resp: 19 (!) 21 18 (!) 23  Temp:   97.8 F (36.6 C)   TempSrc:   Oral   SpO2: (!) 87% 96% 96% 96%  Weight:      Height:       General exam: awake, alert, no acute distress HEENT: atraumatic, clear conjunctiva, anicteric sclera, moist mucus membranes, hearing grossly normal  Respiratory system: CTAB but diminished bases, no wheezes, rales or rhonchi, normal respiratory effort at rest. On 2 L/min [place] O2 Cardiovascular system: normal S1/S2, RRR, resolved lower extremity edema.   Gastrointestinal system: soft, NT, ND, no HSM felt, +bowel sounds. Central nervous system: A&O x4. no gross focal neurologic deficits, normal speech Extremities: moves all , no edema, normal tone Skin: dry, intact, normal temperature Psychiatry: normal mood, congruent affect, judgement and insight appear normal   Data Reviewed:  Notable labs --- Cr improved 1.78 >> 1.31, glucose 140, Na 134, BUN 25,     Echo - EF 56-38%, normal diastolic parameters, mild LVH, moderate LA dilation, mild RA dilation  Family Communication: None  Disposition: Status is: Inpatient Remains inpatient appropriate because: Still requiring supplemental oxygen and continuing IV diuresis    Planned Discharge Destination: Home    Time spent: 45 minutes  Author: Ezekiel Slocumb, DO 08/27/2022 1:48 PM  For on call review [email or web address].

## 2022-08-27 NOTE — Assessment & Plan Note (Addendum)
Has been weaned to room air. Continue monitoring O2 sats esp with exertion. Continuing diuresis. Wean O2 as tolerated, goal spO2 90-94%

## 2022-08-27 NOTE — Assessment & Plan Note (Addendum)
Unclear etiology.  He has congestion, possibly a viral illness vs side effect of lasix. Negative for Covid, FluA/B and RSV. Tylenol or Fioricet PRN Further evaluation outpatient if headaches continue.

## 2022-08-27 NOTE — ED Notes (Signed)
Pt talking on phone with dr.

## 2022-08-27 NOTE — ED Notes (Signed)
RN attempted room air trial for patient. Pt O2 saturation dropped to 87% consistently for a few min without any ambulation. Pt states he can feel a little more sob without the o2. MD Arbutus Ped notified.

## 2022-08-27 NOTE — ED Notes (Signed)
Pt entirety of meal tray, pt denies any additional needs or concerns at this time.

## 2022-08-27 NOTE — ED Notes (Signed)
Pt states his headache is returning. Pt states the tylenol didn't fully help and it has been coming back quickly. RN notified DO Arbutus Ped for alternative PRN headache medication.

## 2022-08-27 NOTE — ED Notes (Signed)
Assumed care from Arissa,RN. Pt resting comfortably in bed at this time. Pt denies any current needs or questions. Call light with in reach.   

## 2022-08-28 DIAGNOSIS — Z86718 Personal history of other venous thrombosis and embolism: Secondary | ICD-10-CM | POA: Diagnosis not present

## 2022-08-28 DIAGNOSIS — N179 Acute kidney failure, unspecified: Secondary | ICD-10-CM

## 2022-08-28 DIAGNOSIS — J9601 Acute respiratory failure with hypoxia: Secondary | ICD-10-CM | POA: Diagnosis not present

## 2022-08-28 DIAGNOSIS — I5033 Acute on chronic diastolic (congestive) heart failure: Secondary | ICD-10-CM | POA: Diagnosis not present

## 2022-08-28 LAB — CBC
HCT: 50.9 % (ref 39.0–52.0)
Hemoglobin: 16.7 g/dL (ref 13.0–17.0)
MCH: 29.1 pg (ref 26.0–34.0)
MCHC: 32.8 g/dL (ref 30.0–36.0)
MCV: 88.8 fL (ref 80.0–100.0)
Platelets: 229 10*3/uL (ref 150–400)
RBC: 5.73 MIL/uL (ref 4.22–5.81)
RDW: 14.3 % (ref 11.5–15.5)
WBC: 11.1 10*3/uL — ABNORMAL HIGH (ref 4.0–10.5)
nRBC: 0 % (ref 0.0–0.2)

## 2022-08-28 LAB — CBG MONITORING, ED
Glucose-Capillary: 152 mg/dL — ABNORMAL HIGH (ref 70–99)
Glucose-Capillary: 180 mg/dL — ABNORMAL HIGH (ref 70–99)
Glucose-Capillary: 134 mg/dL — ABNORMAL HIGH (ref 70–99)

## 2022-08-28 LAB — BASIC METABOLIC PANEL
Anion gap: 11 (ref 5–15)
BUN: 26 mg/dL — ABNORMAL HIGH (ref 6–20)
CO2: 26 mmol/L (ref 22–32)
Calcium: 8.8 mg/dL — ABNORMAL LOW (ref 8.9–10.3)
Chloride: 95 mmol/L — ABNORMAL LOW (ref 98–111)
Creatinine, Ser: 1.26 mg/dL — ABNORMAL HIGH (ref 0.61–1.24)
GFR, Estimated: >60 mL/min (ref >60)
Glucose, Bld: 133 mg/dL — ABNORMAL HIGH (ref 70–99)
Potassium: 3.8 mmol/L (ref 3.5–5.1)
Sodium: 132 mmol/L — ABNORMAL LOW (ref 135–145)

## 2022-08-28 LAB — HEMOGLOBIN A1C
Hgb A1c MFr Bld: 6.9 % — ABNORMAL HIGH (ref 4.8–5.6)
Mean Plasma Glucose: 151 mg/dL

## 2022-08-28 LAB — GLUCOSE, CAPILLARY: Glucose-Capillary: 154 mg/dL — ABNORMAL HIGH (ref 70–99)

## 2022-08-28 MED ORDER — BUTALBITAL-APAP-CAFFEINE 50-325-40 MG PO TABS
1.0000 | ORAL_TABLET | Freq: Four times a day (QID) | ORAL | Status: DC | PRN
  Administered 2022-08-28: 2 via ORAL
  Administered 2022-08-29 – 2022-08-30 (×3): 1 via ORAL
  Filled 2022-08-28: qty 2
  Filled 2022-08-28 (×3): qty 1

## 2022-08-28 NOTE — ED Notes (Signed)
Request made for transport to the floor ?

## 2022-08-28 NOTE — ED Notes (Signed)
Pt reports his headache is back and endorses a sharp pain in leg that radiates up to his hip. Pt not due for PRN headache medicine until 1543. DO Arbutus Ped notified that medication is not keeping pt headache under control and about new leg pain. Waiting on new/changing orders.

## 2022-08-28 NOTE — ED Notes (Signed)
Report given by previous shift RN. Transport called by Network engineer. Pt A&Ox4 at time of departure. VSS.

## 2022-08-28 NOTE — ED Notes (Signed)
Assumed care from Paac [place], [place]. Pt resting comfortably in bed at this time. Pt denies any current needs or questions. Call light with in reach.  RN trialing pt on room air. Pt o2 saturation remaining 93-94%.

## 2022-08-28 NOTE — ED Notes (Signed)
Pt provided supplies to wash up as requested. Pt bedding also changed at this time.

## 2022-08-28 NOTE — Progress Notes (Signed)
Progress Note   Patient: Hector Hancock:[id number] DOB: Jul 18, 1966 DOA: 08/25/2022     2 DOS: the patient was seen and examined on 08/28/2022   Brief hospital course: HPI on admission, per Dr. Damita Dunnings: "Hector Hancock is a 57 y.o. male with medical history significant for HFpEF, class III obesity, type 2 diabetes, restrictive lung disease secondary to obesity, OSA followed by pulmonology, history of LLE DVT 03/2021 on Eliquis, which was switched to Xarelto during her recent hospitalization at [facility] from 1/4 to 1/6 when he presented with left lower extremity swelling and was diagnosed with a left femoral vein DVT who now presents to the ED with dyspnea on exertion.  At his ED visit to [facility] on 1/4 he was noted to be volume overloaded and he was treated with IV Lasix and his home torsemide dose was increased and spironolactone was added. Patient also reports chest pressure but denies nausea, vomiting or diaphoresis and denies lightheadedness or palpitations.  Has no cough, fever or chills. ED course and data review: Vitals notable for soft blood pressure, tachypnea to 27 with O2 sat 87% on room air and otherwise normal vitals.  Troponin 3 and BNP 24.  WBC 11,700.  Respiratory viral panel negative.  BMP significant forcreatinine of 1 8 up from baseline of 1.05 and glucose 229. EKG personally viewed and interpreted showing NSR at 94 with no acute ST-T wave changes.  CTA chest negative for PE and showing minimal patchy groundglass opacities bilaterally, likely infectious/inflammatory.  Patient was treated with IV Bumex albuterol and hospitalist consulted for admission. "  He is being treated with IV Lasix 40 mg BID.  Echocardiogram showed normal LVEF 76-72%, normal diastolic parameters, mild LVH, moderate LA dilation, mild RA dilation.  1/10 - ongoing dyspnea, remains with lower extremity edema, O2 desats on room air.  1/11 - improving, edema nearly resolved.  Still hypoxic when tried on room air today.  Renal  function improving. 1/12 - ongoing dyspnea with minimal exertion, O2 still drops with any exertion. Continue diuresis.  Cardiology consulted.  Assessment and Plan: * Acute on chronic diastolic CHF (congestive heart failure) (HCC) Acute respiratory failure with hypoxia CTA negative for PE.   Patient with dyspnea on exertion.  BNP 24 but could be falsely low due to obesity.   Last known EF 60 to 65% 09/2021 EF unchanged this admission. 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 Renal function improving with diuresis. Monitor BMP. Avoid nephrotoxins.  Uncontrolled type 2 diabetes mellitus with hyperglycemia, without long-term current use of insulin (HCC) Continue Jardiance Sliding scale coverage  Headache Unclear etiology.  He has congestion, possibly a viral illness. Negative for Covid, FluA/B and RSV. Tylenol or Fioricet PRN  Acute respiratory failure with hypoxia (HCC) O2 sats drop into mid 80's on room air. Continuing diuresis. Wean O2 as tolerated, goal spO2  90-94%  OSA on CPAP CPAP nightly Patient follows with pulmonology  Class 3 severe obesity due to excess calories with serious comorbidity and body mass index (BMI) of 45.0 to 49.9 in adult [facility]) Body mass  index is 43.4 kg/m. Complicates overall care and prognosis.  Recommend lifestyle modifications including physical activity and diet for weight loss and overall long-term health.   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  He was standing at sink cleaning up, and visibly short of breath.  He slowly recovered once seated.  Per RN, O2 sat still drops on room air today.  Pt acknowledges need to improve more before able to discharge.  He reports recent weight loss since being started on Ozempic, unsure what his "dry weight" is at this time.  Physical Exam: Vitals:   08/28/22 1100 08/28/22 1209 08/28/22 1232 08/28/22 1330  BP: 126/84  122/82 119/76  Pulse: 86  93 96  Resp: (!) 21  19 18   Temp:    97.8 F (36.6 C)  TempSrc:      SpO2: 93%  93% 94%  Weight:  (!) 151.5 kg    Height:       General exam: awake, alert, no acute distress, obese HEENT: moist mucus membranes, hearing grossly normal  Respiratory system: CTAB but diminished bases, no wheezes, rales or rhonchi, dyspneic standing at sink, on room air Cardiovascular system: normal S1/S2, RRR, resolved lower extremity edema.   Central nervous system: A&O x4. no gross focal neurologic deficits, normal speech Extremities: moves all , no edema, normal tone Skin: dry, intact, normal temperature Psychiatry: normal mood, congruent affect, judgement and insight appear normal   Data Reviewed:  Notable labs --- Cr improved 1.78 >> 1.31 >> 1.26, glucose 133, Na 132, BUN 26, WBC 11.1 from 11.7    Echo - EF 70-62%, normal diastolic parameters, mild LVH, moderate LA dilation, mild RA dilation  Family Communication: None  Disposition: Status is: Inpatient Remains inpatient  appropriate because: requires further IV diuresis given ongoing severe dyspnea on exertion and hypoxia    Planned Discharge Destination: Home    Time spent: 45 minutes  Author: Ezekiel Slocumb, DO 08/28/2022 3:02 PM  For on call review [email or web address].

## 2022-08-28 NOTE — Consult Note (Signed)
Advanced Heart Failure Team Consult Note   Primary Physician: Jonetta Osgood, NP PCP-Cardiologist:  None  Reason for Consultation: CP and acute/chronic systolic HH  HPI:    Hector Hancock is seen today for evaluation of CP/HF at the request of Dr. Arbutus Ped.   57 y.o. male with history of HFpEF, obesity, DM II, restrictive lung disease 2/2 obesity, OSA, hx LLE DVT on Xarelto (had recurrent DVT on eliquis), RA on plaquenil.   In 8/22 had massive PE and underwent endovascular clot extraction  Has been followed by Dr. Karle Barr at [facility] HF clinic and multiple visits for diuretic adjustments. CardioMEMS considered but insurance did not approve.   Admitted to [facility] 01/04-01/06/24 with worsening lower extremity edema. Had new DVT left femoral vein despite compliance with Eliquis. Transitioned to Xarelto at hematology recommendation. He was also volume overloaded and diuresed with IV lasix. Home Torsemide increased.  Presented to [facility] ED on 01/09 with complaints of acute chest pain and dyspnea. CTA chest negative for PE, minimal patchy GGO bilaterally. Labs significant for Scr 1.8 (baseline 1), negative for COVID and flu, BNP 24. Ecg ok. HStrop 3-> 3 -> 2 -> 2  Echo this admit:EF 60-65%, mild concentric LVH, RV okay, mild to moderate LAE, no significant valvular disease  LE u/s: There is acute-appearing occlusive DVT involving the left lower extremity deep veins extending up to the mid left femoral vein.  He's had 9.4L UOP so far with IV lasix (total balance unknown, intake not recorded). Scr improving, 1.26 today.  Advanced HF asked to see to assist with managing HFpEF.  At baseline says he is SOB with minimal exertion and feels it is getting worse. LE edema improved. No orthopnea or PND. Non-compliant with CPAP. Has lost 30 pounds in past 6 months. 360 -> 330   Review of Systems: [y] = yes, [ ]  = no   General: Weight gain [ ] ; Weight loss [ ] ; Anorexia [ ] ; Fatigue [ y]; Fever [ ] ; Chills [  ]; Weakness [ ]   Cardiac: Chest pain/pressure [ ] ; Resting SOB [ y]; Exertional SOB Blue.Reese ]; Orthopnea [ ] ; Pedal Edema Blue.Reese ]; Palpitations [ ] ; Syncope [ ] ; Presyncope [ ] ; Paroxysmal nocturnal dyspnea[ ]   Pulmonary: Cough [ ] ; Wheezing[ ] ; Hemoptysis[ ] ; Sputum [ ] ; Snoring [ y]  GI: Vomiting[ ] ; Dysphagia[ ] ; Melena[ ] ; Hematochezia [ ] ; Heartburn[ ] ; Abdominal pain [ ] ; Constipation [ ] ; Diarrhea [ ] ; BRBPR [ ]   GU: Hematuria[ ] ; Dysuria [ ] ; Nocturia[ ]   Vascular: Pain in legs with walking [ ] ; Pain in feet with lying flat [ ] ; Non-healing sores [ ] ; Stroke [ ] ; TIA [ ] ; Slurred speech [ ] ;  Neuro: Headaches[ ] ; Vertigo[ ] ; Seizures[ ] ; Paresthesias[ ] ;Blurred vision [ ] ; Diplopia [ ] ; Vision changes [ ]   Ortho/Skin: Arthritis Blue.Reese ]; Joint pain Blue.Reese ]; Muscle pain [ ] ; Joint swelling [ ] ; Back Pain [ ] ; Rash [ ]   Psych: Depression[ ] ; Anxiety[ ]   Heme: Bleeding problems [ ] ; Clotting disorders [ y]; Anemia [ ]   Endocrine: Diabetes [ ] ; Thyroid dysfunction[ ]   Home Medications Prior to Admission medications   Medication Sig Start Date End Date Taking? Authorizing Provider  diphenhydrAMINE (BENADRYL) 25 mg capsule Take 50 mg by mouth at bedtime.   Yes [provider]  enalapril (VASOTEC) 5 MG tablet TAKE 1 TABLET (5 MG TOTAL) BY MOUTH DAILY. 08/20/21  Yes Masoud, Viann Shove, MD  hydroxychloroquine (PLAQUENIL) 200 MG tablet  Take 200 mg by mouth 2 (two) times daily.   Yes [provider]  JARDIANCE 25 MG TABS tablet Take 25 mg by mouth every morning. 06/29/22  Yes [provider]  OZEMPIC, 0.25 OR 0.5 MG/DOSE, 2 MG/3ML SOPN Inject 0.5 mg into the skin once a week. sunday 06/18/22  Yes [provider]  rosuvastatin (CRESTOR) 10 MG tablet Take 10 mg by mouth at bedtime.   Yes [provider]  spironolactone (ALDACTONE) 50 MG tablet Take 50 mg by mouth daily. 08/20/21  Yes [provider]  torsemide (DEMADEX) 20 MG tablet Take 20 mg by mouth daily. 10/12/21   Yes [provider]  XARELTO 15 MG TABS tablet Take 15 mg by mouth 2 (two) times daily with a meal.   Yes [provider]  apixaban (ELIQUIS) 5 MG TABS tablet Take by mouth. Patient not taking: Reported on 08/26/2022 06/25/21   [provider]  ELIQUIS 5 MG TABS tablet Take by mouth 2 (two) times daily. Patient not taking: Reported on 08/26/2022 04/08/21   [provider]  Fluticasone-Umeclidin-Vilant (TRELEGY ELLIPTA) 100-62.5-25 MCG/ACT AEPB Inhale 1 puff into the lungs daily. Patient not taking: Reported on 08/26/2022 06/05/21   Allyne Gee, MD  gabapentin (NEURONTIN) 300 MG capsule Take by mouth. Take 3 capsules at bedtime Patient not taking: Reported on 08/26/2022 05/30/21 06/29/21  [provider]  JARDIANCE 10 MG TABS tablet Take 10 mg by mouth daily. Patient not taking: Reported on 08/26/2022 09/18/21   [provider]  spironolactone (ALDACTONE) 25 MG tablet Take 25 mg by mouth daily. Patient not taking: Reported on 08/26/2022 05/30/21   [provider]  tamsulosin (FLOMAX) 0.4 MG CAPS capsule Take 0.4 mg by mouth daily. Patient not taking: Reported on 08/26/2022 06/24/21   [provider]    Past Medical History: Past Medical History:  Diagnosis Date   Arthritis    History of kidney stones    Hypertension     Past Surgical History: Past Surgical History:  Procedure Laterality Date   COLONOSCOPY WITH PROPOFOL N/A 12/27/2015   Procedure: COLONOSCOPY WITH PROPOFOL;  Surgeon: Lucilla Lame, MD;  Location: [place];  Service: Endoscopy;  Laterality: N/A;   EXTRACORPOREAL SHOCK WAVE LITHOTRIPSY Left 07/28/2018   Procedure: EXTRACORPOREAL SHOCK WAVE LITHOTRIPSY (ESWL);  Surgeon: Royston Cowper, MD;  Location: [facility];  Service: Urology;  Laterality: Left;   EXTRACORPOREAL SHOCK WAVE LITHOTRIPSY Right 01/04/2020   Procedure: EXTRACORPOREAL SHOCK WAVE LITHOTRIPSY (ESWL);  Surgeon: Royston Cowper, MD;   Location: [facility];  Service: Urology;  Laterality: Right;   POLYPECTOMY  12/27/2015   Procedure: POLYPECTOMY;  Surgeon: Lucilla Lame, MD;  Location: [place];  Service: Endoscopy;;   SACROILIAC JOINT FUSION Right 09/06/2013   Procedure: SACROILIAC JOINT FUSION;  Surgeon: Sinclair Ship, MD;  Location: [place];  Service: Orthopedics;  Laterality: Right;  Right sided sacroiliac joint fusion   TOTAL SHOULDER ARTHROPLASTY Left    WRIST SURGERY Left     Family History: Family History  Problem Relation Age of Onset   Diabetes Mother    Arthritis Father    Diabetes Sister    Diabetes Sister     Social History: Social History   Socioeconomic History   Marital status: Married    Spouse name: Not on file   Number of children: Not on file   Years of education: Not on file   Highest education level: Not on file  Occupational History  Not on file  Tobacco Use   Smoking status: Never   Smokeless tobacco: Never  Substance and Sexual Activity   Alcohol use: No   Drug use: No   Sexual activity: Not on file  Other Topics Concern   Not on file  Social History Narrative   Not on file   Social Determinants of Health   Financial Resource Strain: Not on file  Food Insecurity: No Food Insecurity (08/26/2022)   Hunger Vital Sign    Worried About Running Out of Food in the Last Year: Never true    Ran Out of Food in the Last Year: Never true  Transportation Needs: No Transportation Needs (08/26/2022)   PRAPARE - Administrator, Civil Service (Medical): No    Lack of Transportation (Non-Medical): No  Physical Activity: Not on file  Stress: Not on file  Social Connections: Not on file    Allergies:  Allergies  Allergen Reactions   Codeine Itching   Prednisone Other (See Comments)    irritable    Objective:    Vital Signs:   Temp:  [97.6 F (36.4 C)-97.9 F (36.6 C)] 97.8 F (36.6 C) (01/12 1330) Pulse Rate:  [81-96] 96 (01/12 1330) Resp:  [15-25]  18 (01/12 1330) BP: (105-128)/(64-86) 119/76 (01/12 1330) SpO2:  [92 %-97 %] 94 % (01/12 1330) Weight:  [151.5 kg] 151.5 kg (01/12 1209)    Weight change: Filed Weights   08/25/22 2151 08/28/22 1209  Weight: (!) 153.3 kg (!) 151.5 kg    Intake/Output:   Intake/Output Summary (Last 24 hours) at 08/28/2022 1548 Last data filed at 08/28/2022 1238 Gross per 24 hour  Intake --  Output 5200 ml  Net -5200 ml      Physical Exam    General:  Obese male. No resp difficulty HEENT: normal Neck: supple. JVP hard to see but looks flat . Carotids 2+ bilat; no bruits. No lymphadenopathy or thyromegaly appreciated. Cor: PMI nondisplaced. Regular rate & rhythm. No rubs, gallops or murmurs. Lungs: clear Abdomen: obese soft, nontender, nondistended. No hepatosplenomegaly. No bruits or masses. Good bowel sounds. Extremities: no cyanosis, clubbing, rash, edema Neuro: alert & orientedx3, cranial nerves grossly intact. moves all 4 extremities w/o difficulty. Affect pleasant   EKG    NSR 94 poor R wave progression no significant ST-T abnormalities   Labs   Basic Metabolic Panel: Recent Labs  Lab 08/25/22 2156 08/27/22 0422 08/28/22 0441  NA 134* 134* 132*  K 4.4 4.2 3.8  CL 101 98 95*  CO2 21* 24 26  GLUCOSE 229* 140* 133*  BUN 26* 25* 26*  CREATININE 1.78* 1.31* 1.26*  CALCIUM 8.8* 9.0 8.8*    Liver Function Tests: No results for input(s): "AST", "ALT", "ALKPHOS", "BILITOT", "PROT", "ALBUMIN" in the last 168 hours. No results for input(s): "LIPASE", "AMYLASE" in the last 168 hours. No results for input(s): "AMMONIA" in the last 168 hours.  CBC: Recent Labs  Lab 08/25/22 2156 08/28/22 0441  WBC 11.7* 11.1*  HGB 15.6 16.7  HCT 48.1 50.9  MCV 89.4 88.8  PLT 218 229    Cardiac Enzymes: No results for input(s): "CKTOTAL", "CKMB", "CKMBINDEX", "TROPONINI" in the last 168 hours.  BNP: BNP (last 3 results) Recent Labs    08/25/22 2156  BNP 24.5    ProBNP (last 3  results) No results for input(s): "PROBNP" in the last 8760 hours.   CBG: Recent Labs  Lab 08/27/22 1150 08/27/22 1615 08/27/22 2121 08/28/22 0748 08/28/22 1226  GLUCAP 149* 159* 171* 152* 180*    Coagulation Studies: No results for input(s): "LABPROT", "INR" in the last 72 hours.   Imaging   No results found.   Medications:     Current Medications:  furosemide  40 mg Intravenous BID   hydroxychloroquine  200 mg Oral Daily   insulin aspart  0-20 Units Subcutaneous TID WC   insulin aspart  0-5 Units Subcutaneous QHS   rivaroxaban  15 mg Oral BID   Followed by   Derrill Memo ON 09/10/2022] rivaroxaban  20 mg Oral Q supper    Infusions:   Assessment/Plan   1. CP and dyspnea - suspect mostly a/c HFpEF - he has diuresed 9L with improvement in respiratory status but still with NYHA III-IIIB symptoms - volume status, echo and BNP all look ok (though BNP confounded by size) - has LLE DVT but CT negative for acute PE - at this point, residual symptoms seem out of proportion to clinical findings - I think he need R/L cath to evaluate coronaries, volume status and pulmonary pressures. I d/w Dr. Karle Barr. Given normal troponin and no high-risk features, we agree that it is likely best to have this done at [facility]. - Would continue IV lasix overnight and can likely d/c in am. I have contacted Dr. Karle Barr to arrange f/u soon at [facility] meds including [place] at d/c  2. H/o PE/DVT with subacute LLE DVT - s/p pulmonary thrombectomy in 8/22 - CT 08/28/22 no PE - no RV strain on echo - plan R/L cath as above - will check VQ to further exclude CTEPH - Continue Xarelto  3. OSA - noncompliant with CPAP  4. Morbid obesity - consider GLP1RA  5. AKI - likely due to volume overload - resolved with diuresis   AHF team will sign off. Please call with questions  Length of Stay: 2  Advanced Heart Failure Team Pager [phone] (M-F; 7a - 5p)  Please contact [facility] for night-coverage after hours (4p -7a ) and weekends on [email or web address]

## 2022-08-28 NOTE — Progress Notes (Signed)
The patient is admitted from ED to 2 A. A & O x 4.The patient is oriented to the staff, ascom/call bell. He voiced no concern. Will continue to monitor.

## 2022-08-29 ENCOUNTER — Inpatient Hospital Stay: Payer: BC Managed Care – PPO

## 2022-08-29 DIAGNOSIS — I5033 Acute on chronic diastolic (congestive) heart failure: Secondary | ICD-10-CM | POA: Diagnosis not present

## 2022-08-29 DIAGNOSIS — E876 Hypokalemia: Secondary | ICD-10-CM

## 2022-08-29 HISTORY — DX: Hypokalemia: E87.6

## 2022-08-29 LAB — BASIC METABOLIC PANEL
Anion gap: 13 (ref 5–15)
BUN: 29 mg/dL — ABNORMAL HIGH (ref 6–20)
CO2: 25 mmol/L (ref 22–32)
Calcium: 8.9 mg/dL (ref 8.9–10.3)
Chloride: 95 mmol/L — ABNORMAL LOW (ref 98–111)
Creatinine, Ser: 1.18 mg/dL (ref 0.61–1.24)
GFR, Estimated: >60 mL/min (ref >60)
Glucose, Bld: 159 mg/dL — ABNORMAL HIGH (ref 70–99)
Potassium: 3.3 mmol/L — ABNORMAL LOW (ref 3.5–5.1)
Sodium: 133 mmol/L — ABNORMAL LOW (ref 135–145)

## 2022-08-29 LAB — GLUCOSE, CAPILLARY
Glucose-Capillary: 157 mg/dL — ABNORMAL HIGH (ref 70–99)
Glucose-Capillary: 183 mg/dL — ABNORMAL HIGH (ref 70–99)
Glucose-Capillary: 142 mg/dL — ABNORMAL HIGH (ref 70–99)
Glucose-Capillary: 191 mg/dL — ABNORMAL HIGH (ref 70–99)

## 2022-08-29 LAB — MAGNESIUM: Magnesium: 2.6 mg/dL — ABNORMAL HIGH (ref 1.7–2.4)

## 2022-08-29 MED ORDER — TECHNETIUM TO 99M ALBUMIN AGGREGATED
4.4700 | Freq: Once | INTRAVENOUS | Status: AC | PRN
  Administered 2022-08-29: 4.47 via INTRAVENOUS

## 2022-08-29 MED ORDER — SPIRONOLACTONE 25 MG PO TABS
25.0000 mg | ORAL_TABLET | Freq: Every day | ORAL | Status: DC
  Administered 2022-08-29 – 2022-08-30 (×2): 25 mg via ORAL
  Filled 2022-08-29 (×2): qty 1

## 2022-08-29 MED ORDER — POTASSIUM CHLORIDE CRYS ER 20 MEQ PO TBCR
40.0000 meq | EXTENDED_RELEASE_TABLET | Freq: Once | ORAL | Status: AC
  Administered 2022-08-29: 40 meq via ORAL
  Filled 2022-08-29: qty 2

## 2022-08-29 NOTE — Assessment & Plan Note (Signed)
Due to diuresis. Replace PO K-Cl today for K 3.3 Monitor BMP

## 2022-08-29 NOTE — Progress Notes (Addendum)
Progress Note   Patient: Hector Hancock ZSW:[id number] DOB: [birth year] DOA: 08/25/2022     3 DOS: the patient was seen and examined on 08/29/2022   Brief hospital course: HPI on admission, per Dr. Damita Dunnings: "Hector Hancock is a 57 y.o. male with medical history significant for HFpEF, class III obesity, type 2 diabetes, restrictive lung disease secondary to obesity, OSA followed by pulmonology, history of LLE DVT 03/2021 on Eliquis, which was switched to Xarelto during her recent hospitalization at [facility] from 1/4 to 1/6 when he presented with left lower extremity swelling and was diagnosed with a left femoral vein DVT who now presents to the ED with dyspnea on exertion.  At his ED visit to [facility] on 1/4 he was noted to be volume overloaded and he was treated with IV Lasix and his home torsemide dose was increased and spironolactone was added. Patient also reports chest pressure but denies nausea, vomiting or diaphoresis and denies lightheadedness or palpitations.  Has no cough, fever or chills. ED course and data review: Vitals notable for soft blood pressure, tachypnea to 27 with O2 sat 87% on room air and otherwise normal vitals.  Troponin 3 and BNP 24.  WBC 11,700.  Respiratory viral panel negative.  BMP significant forcreatinine of 1 8 up from baseline of 1.05 and glucose 229. EKG personally viewed and interpreted showing NSR at 94 with no acute ST-T wave changes.  CTA chest negative for PE and showing minimal patchy groundglass opacities bilaterally, likely infectious/inflammatory.  Patient was treated with IV Bumex albuterol and hospitalist consulted for admission. "  He is being treated with IV Lasix 40 mg BID.  Echocardiogram showed normal LVEF 32-20%, normal diastolic parameters, mild LVH, moderate LA dilation, mild RA dilation.  1/10 - ongoing dyspnea, remains with lower extremity edema, O2 desats on room air.  1/11 - improving, edema nearly resolved.  Still hypoxic when tried on room air today.  Renal  function improving. 1/12 - ongoing dyspnea with minimal exertion, O2 still drops with any exertion. Continue diuresis.  Cardiology consulted.  Assessment and Plan: * Acute on chronic diastolic CHF (congestive heart failure) (HCC) Acute respiratory failure with hypoxia CTA negative for PE.   Patient with dyspnea on exertion.  BNP 24 but could be falsely low due to obesity.   Last known EF 60 to 65% 09/2021 EF unchanged this admission. 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 POA with Cr 1.78 (baseline around 0.9-1.1. Renal function improving with diuresis. Monitor BMP. Avoid nephrotoxins.  Uncontrolled type 2 diabetes mellitus with hyperglycemia, without long-term current use of insulin (HCC) Continue Jardiance Sliding scale coverage  Hypokalemia Due to diuresis. Replace PO K-Cl today for K 3.3 Monitor BMP  Headache Unclear etiology.  He has congestion,  possibly a viral illness. Negative for Covid, FluA/B and RSV. Tylenol or Fioricet PRN  Acute respiratory failure with hypoxia ([place]) Has been weaned to room air. Continue monitoring O2 sats esp with  exertion. Continuing diuresis. Wean O2 as tolerated, goal spO2 90-94%  OSA on CPAP CPAP nightly Patient follows with pulmonology  Class 3 severe obesity due to excess calories with serious comorbidity and body mass index (BMI) of 45.0 to 49.9 in adult [facility]) Body mass index is 43.4 kg/m. Complicates overall care and prognosis.  Recommend lifestyle modifications including physical activity and diet for weight loss and overall long-term health.   Rheumatoid arthritis involving both hands with positive rheumatoid factor (HCC) Continue Plaquenil  Hypertension Blood pressure was somewhat soft on admission and with diuresis --Hold enalapril --Resume spironolactone --Continue IV lasix        Subjective: Pt seen this AM, awake sitting up in bed.  Reports ongoing severe shortness of breath just going to bathroom this AM.  Urine output remains good.  He does not feel breathing has improved much if any despite good diuresis so far.   Physical Exam: Vitals:   08/29/22 0027 08/29/22 0357 08/29/22 0801 08/29/22 1142  BP: 130/89 110/82 112/78 117/88  Pulse: (!) 58 84 79 97  Resp: 18 18 19 18   Temp: 98.3 F (36.8 C) 98.3 F (36.8 C) 98.3 F (36.8 C) 98.1 F (36.7 C)  TempSrc: Oral Oral Oral   SpO2: 95% 92% 94% 93%  Weight:      Height:       General exam: awake, alert, no acute distress, obese HEENT: moist mucus membranes, hearing grossly normal  Respiratory system: lungs remain clear with diminished bases, no wheezes, rales or rhonchi, on room air but dyspneic on minimal exertion Cardiovascular system: normal S1/S2, RRR, resolved lower extremity edema.   Central nervous system: A&O x4. no gross focal neurologic deficits, normal speech Extremities: moves all , no edema, normal tone Skin: dry, intact, normal temperature Psychiatry: normal mood, congruent affect, judgement and insight appear normal   Data Reviewed:  Notable labs --- Cr improving 1.78 >> 1.31 >> 1.26 >> 1.18,  glucose 159, Na 133, BUN 29, Mg 2.6    Echo 08/26/2022  - EF 93-23%, normal diastolic parameters, mild LVH, moderate LA dilation, mild RA dilation    Family Communication: None  Disposition: Status is: Inpatient Remains inpatient appropriate because: requires further IV diuresis given ongoing severe dyspnea on exertion and hypoxia. Ongoing evaluation    Planned Discharge Destination: Home    Time spent: 45 minutes  Author: Ezekiel Slocumb, DO 08/29/2022 12:28 PM  For on call review [email or web address].

## 2022-08-29 NOTE — Progress Notes (Signed)
Rounding Note    Patient Name: Hector Hancock Date of Encounter: 08/29/2022  [place] Cardiologist: [facility]  Subjective   Patient reports worsening SOB. NM pulmonary perfusion performed today. No chest pain   Inpatient Medications    Scheduled Meds:  furosemide  40 mg Intravenous BID   hydroxychloroquine  200 mg Oral Daily   insulin aspart  0-20 Units Subcutaneous TID WC   insulin aspart  0-5 Units Subcutaneous QHS   rivaroxaban  15 mg Oral BID   Followed by   Derrill Memo ON 09/10/2022] rivaroxaban  20 mg Oral Q supper   Continuous Infusions:  PRN Meds: acetaminophen **OR** acetaminophen, albuterol, butalbital-acetaminophen-caffeine, HYDROcodone-acetaminophen, morphine injection, ondansetron **OR** ondansetron (ZOFRAN) IV   Vital Signs    Vitals:   08/28/22 2014 08/29/22 0027 08/29/22 0357 08/29/22 0801  BP: 109/76 130/89 110/82 112/78  Pulse: 93 (!) 58 84 79  Resp: 18 18 18 19   Temp: 98.6 F (37 C) 98.3 F (36.8 C) 98.3 F (36.8 C) 98.3 F (36.8 C)  TempSrc: Oral Oral Oral Oral  SpO2: 95% 95% 92% 94%  Weight:      Height:        Intake/Output Summary (Last 24 hours) at 08/29/2022 1105 Last data filed at 08/29/2022 0825 Gross per 24 hour  Intake 120 ml  Output 2600 ml  Net -2480 ml      08/28/2022   12:09 PM 08/25/2022    9:51 PM 04/06/2022    3:24 PM  Last 3 Weights  Weight (lbs) 334 lb 338 lb 350 lb  Weight (kg) 151.501 kg 153.316 kg 158.759 kg      Telemetry    0NSR 80s - Personally Reviewed  ECG    No new - Personally Reviewed  Physical Exam   GEN: No acute distress.   Neck: No JVD Cardiac: RRR, no murmurs, rubs, or gallops.  Respiratory: Clear to auscultation bilaterally. GI: Soft, nontender, non-distended  MS: No edema; No deformity. Neuro:  Nonfocal  Psych: Normal affect   Labs    High Sensitivity Troponin:   Recent Labs  Lab 08/25/22 2156 08/26/22 0050 08/26/22 0957 08/26/22 1142  TROPONINIHS 3 3 2 2       Chemistry Recent Labs  Lab 08/27/22 0422 08/28/22 0441 08/29/22 0506  NA 134* 132* 133*  K 4.2 3.8 3.3*  CL 98 95* 95*  CO2 24 26 25   GLUCOSE 140* 133* 159*  BUN 25* 26* 29*  CREATININE 1.31* 1.26* 1.18  CALCIUM 9.0 8.8* 8.9  MG  --   --  2.6*  GFRNONAA >60 >60 >60  ANIONGAP 12 11 13     Lipids No results for input(s): "CHOL", "TRIG", "HDL", "LABVLDL", "LDLCALC", "CHOLHDL" in the last 168 hours.  Hematology Recent Labs  Lab 08/25/22 2156 08/28/22 0441  WBC 11.7* 11.1*  RBC 5.38 5.73  HGB 15.6 16.7  HCT 48.1 50.9  MCV 89.4 88.8  MCH 29.0 29.1  MCHC 32.4 32.8  RDW 14.8 14.3  PLT 218 229   Thyroid No results for input(s): "TSH", "FREET4" in the last 168 hours.  BNP Recent Labs  Lab 08/25/22 2156  BNP 24.5    DDimer No results for input(s): "DDIMER" in the last 168 hours.   Radiology    No results found.  Cardiac Studies   Echo 08/2022  1. Left ventricular ejection fraction, by estimation, is 60 to 65%. The  left ventricle has normal function. The left ventricle has no regional  wall motion  abnormalities. There is mild concentric left ventricular  hypertrophy. Left ventricular diastolic  parameters were normal.   2. Right ventricular systolic function is normal. The right ventricular  size is normal.   3. Left atrial size was mild to moderately dilated.   4. Right atrial size was mildly dilated.   5. The mitral valve is normal in structure. No evidence of mitral valve  regurgitation. No evidence of mitral stenosis.   6. The aortic valve is normal in structure. Aortic valve regurgitation is  trivial. Aortic valve sclerosis is present, with no evidence of aortic  valve stenosis.   7. The inferior vena cava is normal in size with greater than 50%  respiratory variability, suggesting right atrial pressure of 3 mmHg.   Patient Profile     57 y.o. male f HFpEF, obesity, DM II, restrictive lung disease 2/2 obesity, OSA, hx LLE DVT on Xarelto (had recurrent  DVT on eliquis), RA on plaquenil who is being seen for chest pain and CHF.  Assessment & Plan    Chest pain and dyspnea Chronic HFpEF - with h/o CHF, also with LLE DVT,  CT chest negative for acute PE - HS trop negative - BNP low, but low in the setting of obesity - IV lasix 40mg  BID - Net UOP -10.4L - PTA enalapril, Jardiance, spiro held - seen by heart failure team. Symptoms seem out pf proportion to clinical findings, may eventually need a R/L heart cath, however this can be done at [place] pulmonary perfusion scan performed today - can restart home meds  H/o PE/DVT with subacute LEE DVT - s/p pulmonary thrombectomy 03/2021 - CT 08/2022 negative for PE - no RV strain on echo - plan for R/L heart cath as above - continue Xarelto  OSA - noncompliant with CPAP  AKI - resolved with diuresis  For questions or updates, please contact [place] Please consult [email or web address] for contact info under        Signed, Diamonte Stavely Ninfa Meeker, PA-C  08/29/2022, 11:05 AM

## 2022-08-30 ENCOUNTER — Encounter: Payer: Self-pay | Admitting: Internal Medicine

## 2022-08-30 DIAGNOSIS — I5033 Acute on chronic diastolic (congestive) heart failure: Secondary | ICD-10-CM | POA: Diagnosis not present

## 2022-08-30 LAB — BASIC METABOLIC PANEL
Anion gap: 8 (ref 5–15)
BUN: 29 mg/dL — ABNORMAL HIGH (ref 6–20)
CO2: 28 mmol/L (ref 22–32)
Calcium: 9 mg/dL (ref 8.9–10.3)
Chloride: 96 mmol/L — ABNORMAL LOW (ref 98–111)
Creatinine, Ser: 1.27 mg/dL — ABNORMAL HIGH (ref 0.61–1.24)
GFR, Estimated: >60 mL/min (ref >60)
Glucose, Bld: 154 mg/dL — ABNORMAL HIGH (ref 70–99)
Potassium: 4 mmol/L (ref 3.5–5.1)
Sodium: 132 mmol/L — ABNORMAL LOW (ref 135–145)

## 2022-08-30 LAB — GLUCOSE, CAPILLARY
Glucose-Capillary: 162 mg/dL — ABNORMAL HIGH (ref 70–99)
Glucose-Capillary: 121 mg/dL — ABNORMAL HIGH (ref 70–99)

## 2022-08-30 MED ORDER — TORSEMIDE 20 MG PO TABS: 20.0000 mg | ORAL_TABLET | Freq: Every day | ORAL | 3 refills | Status: DC

## 2022-08-30 MED ORDER — RIVAROXABAN 20 MG PO TABS: 20.0000 mg | ORAL_TABLET | Freq: Every day | ORAL | 3 refills | Status: DC

## 2022-08-30 MED ORDER — XARELTO 15 MG PO TABS: 15.0000 mg | ORAL_TABLET | Freq: Two times a day (BID) | ORAL | 0 refills | Status: DC

## 2022-08-30 MED ORDER — SPIRONOLACTONE 25 MG PO TABS: 25.0000 mg | ORAL_TABLET | Freq: Every day | ORAL | 3 refills | Status: DC

## 2022-08-30 MED ORDER — RIVAROXABAN 15 MG PO TABS: 15.0000 mg | ORAL_TABLET | Freq: Two times a day (BID) | ORAL | Status: DC

## 2022-08-30 MED ORDER — RIVAROXABAN 20 MG PO TABS: 20.0000 mg | ORAL_TABLET | Freq: Every day | ORAL | Status: DC

## 2022-08-30 MED ORDER — BUTALBITAL-APAP-CAFFEINE 50-325-40 MG PO TABS: 1.0000 | ORAL_TABLET | Freq: Four times a day (QID) | ORAL | 0 refills | Status: DC | PRN

## 2022-08-30 NOTE — Discharge Summary (Addendum)
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 Para March: "Hector Hancock is a 57 y.o. male with medical history significant for HFpEF, class III obesity, type 2 diabetes, restrictive lung disease secondary to obesity, OSA followed by pulmonology, history of LLE DVT 03/2021 on Eliquis, which was switched to Xarelto during her recent hospitalization at [facility] from 1/4 to 1/6 when he presented with left lower extremity swelling and was diagnosed with a left femoral vein DVT who now presents to the ED with dyspnea on exertion.  At his ED visit to [facility] on 1/4 he was noted to be volume overloaded and he was treated with IV Lasix and his home torsemide dose was increased and spironolactone was added. Patient also reports chest pressure but denies nausea, vomiting or diaphoresis and denies lightheadedness or palpitations.  Has no cough, fever or chills. ED course and data review: Vitals notable for soft blood pressure, tachypnea to  27 with O2 sat 87% on room air and otherwise normal vitals.  Troponin 3 and BNP 24.  WBC 11,700.  Respiratory viral panel negative.  BMP significant forcreatinine of 1 8 up from baseline of 1.05 and glucose 229. EKG personally viewed and interpreted showing NSR at 94 with no acute ST-T wave changes.  CTA chest negative for PE and showing minimal patchy groundglass opacities bilaterally, likely infectious/inflammatory.  Patient was treated with IV Bumex albuterol and hospitalist consulted for admission. "  Started on diuresis with IV Lasix 40 mg BID.  Echocardiogram showed normal LVEF 60-65%, normal diastolic parameters, mild LVH, moderate LA dilation, mild RA dilation.  1/10 - ongoing dyspnea, remains with lower extremity edema, O2 desats on room air.  1/11 - improving, edema nearly resolved.  Still hypoxic when tried on room air today.  Renal function improving. 1/12 - ongoing dyspnea with minimal exertion, O2 still drops with any exertion. Continue diuresis.  Cardiology consulted.  1/14 -- overall seems euvolemic.  Persistent DOE but medically stable and patient requests d/c home today. He will closely follow up for R/L heart cath with his cardiology team at [facility].   States he knows his limitations and needs to remain out of work until he can tolerate more activity.  Work note provided  Assessment and Plan: * Acute on chronic diastolic CHF (congestive heart failure) (HCC) Acute respiratory failure with hypoxia CTA negative for PE.   Patient with dyspnea on exertion.   BNP 24 but could be falsely low due to obesity.  Last known EF 60 to 65% 09/2021 EF unchanged this admission. Recently treated for fluid overload at [facility] from 1/4 to 1/6 with increase in home torsemide dose Diuresed well, but has persistent dyspnea on exertion --Treated with IV Lasix --resume home torsemide 20 mg daily at d/c. 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 POA with Cr 1.78 (baseline around 0.9-1.1. Renal function improving with diuresis. Monitor BMP. Avoid nephrotoxins.  Uncontrolled type 2 diabetes mellitus with hyperglycemia, without long-term current use of insulin (HCC) Continue Jardiance Sliding scale coverage  Headache Unclear etiology.  He has congestion, possibly a viral illness vs side effect of lasix. Negative for Covid, FluA/B and RSV. Tylenol or Fioricet PRN Further evaluation outpatient if headaches continue.  OSA on CPAP CPAP nightly Patient follows with pulmonology  Class 3 severe obesity due to excess calories with serious comorbidity and body mass index (BMI) of 45.0 to 49.9 in adult [facility]) Body mass index is 43.4 kg/m. Complicates overall care and prognosis.  Recommend lifestyle modifications including physical activity and diet for weight loss and overall long-term health.   Rheumatoid arthritis involving both hands with positive rheumatoid factor (HCC) Continue Plaquenil  Hypertension Blood pressure was somewhat soft on admission and with diuresis --Hold enalapril --Resume spironolactone --Continue IV lasix  Hypokalemia-resolved as of 08/30/2022 Due to diuresis. Replace PO K-Cl today for K 3.3 Monitor BMP  Acute respiratory failure with hypoxia (HCC)-resolved as of 08/30/2022 Has been weaned to room air. Continue monitoring O2 sats esp with exertion. Continuing diuresis. Wean O2 as  tolerated, goal spO2 90-94%         Consultants: cardiology, heart failure team Procedures performed: Echo  Disposition: Home Diet recommendation:  Discharge Diet Orders (From admission, onward)     Start     Ordered   08/30/22 0000  Diet - low sodium heart healthy        08/30/22 1019           Cardiac diet DISCHARGE MEDICATION: Allergies as of 08/30/2022       Reactions   Codeine Itching   Prednisone Other (See Comments)   irritable        Medication List     STOP taking these medications    apixaban 5 MG Tabs tablet Commonly known as: ELIQUIS   Eliquis 5 MG Tabs tablet Generic drug: apixaban   gabapentin 300 MG capsule Commonly known as: NEURONTIN   tamsulosin 0.4 MG Caps capsule Commonly known as: FLOMAX   Trelegy Ellipta 100-62.5-25 MCG/ACT Aepb Generic drug: Fluticasone-Umeclidin-Vilant       TAKE these medications    butalbital-acetaminophen-caffeine 50-325-40 MG tablet Commonly known as: FIORICET Take 1-2 tablets by mouth every 6 (six) hours as needed for headache.   diphenhydrAMINE 25 mg capsule Commonly known as: BENADRYL Take 50 mg by mouth at bedtime.   enalapril 5 MG tablet Commonly known as: VASOTEC TAKE 1 TABLET (5 MG TOTAL) BY MOUTH DAILY.   hydroxychloroquine 200 MG tablet Commonly known as: PLAQUENIL Take  200 mg by mouth 2 (two) times daily.   Jardiance 25 MG Tabs tablet Generic drug: empagliflozin Take 25 mg by mouth every morning. What changed: Another medication with the same name was removed. Continue taking this medication, and follow the directions you see here.   Ozempic (0.25 or 0.5 MG/DOSE) 2 MG/3ML Sopn Generic drug: Semaglutide(0.25 or 0.5MG /DOS) Inject 0.5 mg into the skin once a week. sunday   rosuvastatin 10 MG tablet Commonly known as: CRESTOR Take 10 mg by mouth at bedtime.   spironolactone 25 MG tablet Commonly known as: ALDACTONE Take 1 tablet (25 mg total) by mouth daily. Start taking on:  August 31, 2022 What changed: Another medication with the same name was removed. Continue taking this medication, and follow the directions you see here.   torsemide 20 MG tablet Commonly known as: DEMADEX Take 1 tablet (20 mg total) by mouth daily. If increasing weight or edema, take a second 20mg  dose in evening What changed: additional instructions   Xarelto 15 MG Tabs tablet Generic drug: Rivaroxaban Take 1 tablet (15 mg total) by mouth 2 (two) times daily with a meal for 10 days. Through Jan. 24th.  On Jan 25th, start taking 20 mg once daily with a meal. What changed: additional instructions   rivaroxaban 20 MG Tabs tablet Commonly known as: XARELTO Take 1 tablet (20 mg total) by mouth daily with supper. Starting 09/10/2022 Start taking on: September 10, 2022 What changed: You were already taking a medication with the same name, and this prescription was added. Make sure you understand how and when to take each.        Discharge Exam: Filed Weights   08/25/22 2151 08/28/22 1209  Weight: (!) 153.3 kg (!) 151.5 kg   General exam: awake, alert, no acute distress, obese HEENT: atraumatic, clear conjunctiva, anicteric sclera, moist mucus membranes, hearing grossly normal  Respiratory system: CTAB, no wheezes, rales or rhonchi, normal respiratory effort at rest. Cardiovascular system: normal S1/S2, RRR, no JVD, murmurs, rubs, gallops, no pedal edema.   Gastrointestinal system: soft, NT, ND, no HSM felt, +bowel sounds. Central nervous system: A&O x4. no gross focal neurologic deficits, normal speech Extremities: moves all, no edema, normal tone Skin: dry, intact, normal temperature, normal color, No rashes, lesions or ulcers Psychiatry: normal mood, congruent affect, judgement and insight appear normal   Condition at discharge: stable  The results of significant diagnostics from this hospitalization (including imaging, microbiology, ancillary and laboratory) are listed below for  reference.   Imaging Studies: DG Chest 2 View  Result Date: 08/29/2022 CLINICAL DATA:  [id number] Dyspnea [id number] EXAM: CHEST - 2 VIEW COMPARISON:  08/25/2022 FINDINGS: Cardiac silhouette is prominent. There is pulmonary interstitial prominence with vascular congestion. No focal consolidation. No pneumothorax or pleural effusion identified. Aorta is calcified. IMPRESSION: Findings suggest CHF. Electronically Signed   By: Layla MawJoshua  Pleasure M.D.   On: 08/29/2022 14:04   NM Pulmonary Perfusion  Result Date: 08/29/2022 CLINICAL DATA:  Pulmonary hypertension EXAM: NUCLEAR MEDICINE PERFUSION LUNG SCAN TECHNIQUE: Perfusion images were obtained in multiple projections after intravenous injection of radiopharmaceutical. Ventilation scans intentionally deferred if perfusion scan and chest x-ray adequate for interpretation during COVID 19 epidemic. RADIOPHARMACEUTICALS:  4.5 mCi Tc-438m MAA IV COMPARISON:  None Available. FINDINGS: No significant segmental or subsegmental perfusion defects are identified to suggest PE. IMPRESSION: No significant pulmonary perfusion defects identified. Electronically Signed   By: Layla MawJoshua  Pleasure M.D.   On: 08/29/2022 14:01   ECHOCARDIOGRAM COMPLETE  Result Date:  08/26/2022    ECHOCARDIOGRAM REPORT   Patient Name:   Hector Hancock Date of Exam: 08/26/2022 Medical Rec #:  [id number]     Height:       74.0 in Accession #:    [id number]    Weight:       338.0 lb Date of Birth:  [birth year]      BSA:          2.716 m Patient Age:    56 years      BP:           114/59 mmHg Patient Gender: M             HR:           73 bpm. Exam Location:  [facility] Procedure: 2D Echo Indications:     CHF  History:         Patient has no prior history of Echocardiogram examinations.                  CHF; Risk Factors:Hypertension.  Sonographer:     Cathie Hoops Referring Phys:  [id number] Andris Baumann Diagnosing Phys: Adrian Blackwater  Sonographer Comments: Technically difficult study due to poor echo windows and patient is  obese. Image acquisition challenging due to patient body habitus and Image acquisition challenging due to respiratory motion. IMPRESSIONS  1. Left ventricular ejection fraction, by estimation, is 60 to 65%. The left ventricle has normal function. The left ventricle has no regional wall motion abnormalities. There is mild concentric left ventricular hypertrophy. Left ventricular diastolic parameters were normal.  2. Right ventricular systolic function is normal. The right ventricular size is normal.  3. Left atrial size was mild to moderately dilated.  4. Right atrial size was mildly dilated.  5. The mitral valve is normal in structure. No evidence of mitral valve regurgitation. No evidence of mitral stenosis.  6. The aortic valve is normal in structure. Aortic valve regurgitation is trivial. Aortic valve sclerosis is present, with no evidence of aortic valve stenosis.  7. The inferior vena cava is normal in size with greater than 50% respiratory variability, suggesting right atrial pressure of 3 mmHg. FINDINGS  Left Ventricle: Left ventricular ejection fraction, by estimation, is 60 to 65%. The left ventricle has normal function. The left ventricle has no regional wall motion abnormalities. The left ventricular internal cavity size was normal in size. There is  mild concentric left ventricular hypertrophy. Left ventricular diastolic parameters were normal. Right Ventricle: The right ventricular size is normal. No increase in right ventricular wall thickness. Right ventricular systolic function is normal. Left Atrium: Left atrial size was mild to moderately dilated. Right Atrium: Right atrial size was mildly dilated. Pericardium: There is no evidence of pericardial effusion. Mitral Valve: The mitral valve is normal in structure. No evidence of mitral valve regurgitation. No evidence of mitral valve stenosis. Tricuspid Valve: The tricuspid valve is normal in structure. Tricuspid valve regurgitation is mild . No  evidence of tricuspid stenosis. Aortic Valve: The aortic valve is normal in structure. Aortic valve regurgitation is trivial. Aortic valve sclerosis is present, with no evidence of aortic valve stenosis. Aortic valve mean gradient measures 1.0 mmHg. Aortic valve peak gradient measures 1.8 mmHg. Aortic valve area, by VTI measures 1.71 cm. Pulmonic Valve: The pulmonic valve was normal in structure. Pulmonic valve regurgitation is trivial. No evidence of pulmonic stenosis. Aorta: The aortic root is normal in size and structure. Venous: The inferior vena cava is normal  in size with greater than 50% respiratory variability, suggesting right atrial pressure of 3 mmHg. IAS/Shunts: No atrial level shunt detected by color flow Doppler.  LEFT VENTRICLE PLAX 2D LVIDd:         4.40 cm   Diastology LVIDs:         3.00 cm   LV e' medial:    7.72 cm/s LV PW:         1.10 cm   LV E/e' medial:  8.5 LV IVS:        1.20 cm   LV e' lateral:   8.16 cm/s LVOT diam:     2.00 cm   LV E/e' lateral: 8.0 LV SV:         20 LV SV Index:   7 LVOT Area:     3.14 cm  RIGHT VENTRICLE RV Basal diam:  4.70 cm RV Mid diam:    3.90 cm RV S prime:     11.70 cm/s TAPSE (M-mode): 2.3 cm LEFT ATRIUM           Index        RIGHT ATRIUM           Index LA diam:      3.40 cm 1.25 cm/m   RA Area:     14.50 cm LA Vol (A4C): 45.1 ml 16.60 ml/m  RA Volume:   39.60 ml  14.58 ml/m  AORTIC VALVE                    PULMONIC VALVE AV Area (Vmax):    2.21 cm     PV Vmax:       0.68 m/s AV Area (Vmean):   1.94 cm     PV Peak grad:  1.8 mmHg AV Area (VTI):     1.71 cm AV Vmax:           67.80 cm/s AV Vmean:          50.100 cm/s AV VTI:            0.119 m AV Peak Grad:      1.8 mmHg AV Mean Grad:      1.0 mmHg LVOT Vmax:         47.60 cm/s LVOT Vmean:        30.900 cm/s LVOT VTI:          0.065 m LVOT/AV VTI ratio: 0.54  AORTA Ao Root diam: 3.40 cm MITRAL VALVE MV Area (PHT): 3.91 cm    SHUNTS MV Decel Time: 194 msec    Systemic VTI:  0.06 m MV E velocity: 65.40  cm/s  Systemic Diam: 2.00 cm MV A velocity: 51.50 cm/s MV E/A ratio:  1.27 Shaukat Khan Electronically signed by Adrian Blackwater Signature Date/Time: 08/26/2022/10:32:18 AM    Final    CT Angio Chest PE W and/or Wo Contrast  Result Date: 08/25/2022 CLINICAL DATA:  Syncope. EXAM: CT ANGIOGRAPHY CHEST WITH CONTRAST TECHNIQUE: Multidetector CT imaging of the chest was performed using the standard protocol during bolus administration of intravenous contrast. Multiplanar CT image reconstructions and MIPs were obtained to evaluate the vascular anatomy. RADIATION DOSE REDUCTION: This exam was performed according to the departmental dose-optimization program which includes automated exposure control, adjustment of the mA and/or kV according to patient size and/or use of iterative reconstruction technique. CONTRAST:  22mL OMNIPAQUE IOHEXOL 350 MG/ML SOLN COMPARISON:  Chest CT 05/03/2006 FINDINGS: Cardiovascular: Satisfactory opacification of the pulmonary arteries to the segmental  level. No evidence of pulmonary embolism. Normal heart size. No pericardial effusion. Mediastinum/Nodes: No enlarged mediastinal, hilar, or axillary lymph nodes. Thyroid gland, trachea, and esophagus demonstrate no significant findings. Lungs/Pleura: There are minimal patchy ground-glass opacities bilaterally. There is no lung consolidation, pleural effusion or pneumothorax. Upper Abdomen: No acute abnormality. Musculoskeletal: Left shoulder arthroplasty is present. No focal osseous lesion. Review of the MIP images confirms the above findings. IMPRESSION: 1. No evidence for pulmonary embolism. 2. Minimal patchy ground-glass opacities bilaterally, likely infectious/inflammatory. Electronically Signed   By: Ronney Asters M.D.   On: 08/25/2022 23:16   DG Chest 1 View  Result Date: 08/25/2022 CLINICAL DATA:  Chest pain EXAM: CHEST  1 VIEW COMPARISON:  04/01/2021 FINDINGS: Borderline cardiomegaly. Aortic atherosclerosis. No acute airspace disease,  pleural effusion or pneumothorax. IMPRESSION: No active disease. Borderline to mild cardiomegaly. Electronically Signed   By: Donavan Foil M.D.   On: 08/25/2022 22:42    Microbiology: Results for orders placed or performed during the hospital encounter of 08/25/22  Resp panel by RT-PCR (RSV, Flu A&B, Covid) Anterior Nasal Swab     Status: None   Collection Time: 08/26/22 12:50 AM   Specimen: Anterior Nasal Swab  Result Value Ref Range Status   SARS Coronavirus 2 by RT PCR NEGATIVE NEGATIVE Final    Comment: (NOTE) SARS-CoV-2 target nucleic acids are NOT DETECTED.  The SARS-CoV-2 RNA is generally detectable in upper respiratory specimens during the acute phase of infection. The lowest concentration of SARS-CoV-2 viral copies this assay can detect is 138 copies/mL. A negative result does not preclude SARS-Cov-2 infection and should not be used as the sole basis for treatment or other patient management decisions. A negative result may occur with  improper specimen collection/handling, submission of specimen other than nasopharyngeal swab, presence of viral mutation(s) within the areas targeted by this assay, and inadequate number of viral copies(<138 copies/mL). A negative result must be combined with clinical observations, patient history, and epidemiological information. The expected result is Negative.  Fact Sheet for Patients:  EntrepreneurPulse.com.au  Fact Sheet for Healthcare Providers:  IncredibleEmployment.be  This test is no t yet approved or cleared by the [place] FDA and  has been authorized for detection and/or diagnosis of SARS-CoV-2 by FDA under an Emergency Use Authorization (EUA). This EUA will remain  in effect (meaning this test can be used) for the duration of the COVID-19 declaration under Section 564(b)(1) of the Act, 21 U.S.C.section 360bbb-3(b)(1), unless the authorization is terminated  or revoked sooner.        Influenza A by PCR NEGATIVE NEGATIVE Final   Influenza B by PCR NEGATIVE NEGATIVE Final    Comment: (NOTE) The Xpert Xpress SARS-CoV-2/FLU/RSV plus assay is intended as an aid in the diagnosis of influenza from Nasopharyngeal swab specimens and should not be used as a sole basis for treatment. Nasal washings and aspirates are unacceptable for Xpert Xpress SARS-CoV-2/FLU/RSV testing.  Fact Sheet for Patients: EntrepreneurPulse.com.au  Fact Sheet for Healthcare Providers: IncredibleEmployment.be  This test is not yet approved or cleared by the [place] FDA and has been authorized for detection and/or diagnosis of SARS-CoV-2 by FDA under an Emergency Use Authorization (EUA). This EUA will remain in effect (meaning this test can be used) for the duration of the COVID-19 declaration under Section 564(b)(1) of the Act, 21 U.S.C. section 360bbb-3(b)(1), unless the authorization is terminated or revoked.     Resp Syncytial Virus by PCR NEGATIVE NEGATIVE Final    Comment: (NOTE) Fact  Sheet for Patients: EntrepreneurPulse.com.au  Fact Sheet for Healthcare Providers: IncredibleEmployment.be  This test is not yet approved or cleared by the [place] FDA and has been authorized for detection and/or diagnosis of SARS-CoV-2 by FDA under an Emergency Use Authorization (EUA). This EUA will remain in effect (meaning this test can be used) for the duration of the COVID-19 declaration under Section 564(b)(1) of the Act, 21 U.S.C. section 360bbb-3(b)(1), unless the authorization is terminated or revoked.  Performed at [facility], [address] [place], [place] [address]     Labs: CBC: Recent Labs  Lab 08/25/22 2156 08/28/22 0441  WBC 11.7* 11.1*  HGB 15.6 16.7  HCT 48.1 50.9  MCV 89.4 88.8  PLT 218 048   Basic Metabolic Panel: Recent Labs  Lab 08/25/22 2156 08/27/22 0422 08/28/22 0441  08/29/22 0506 08/30/22 0826  NA 134* 134* 132* 133* 132*  K 4.4 4.2 3.8 3.3* 4.0  CL 101 98 95* 95* 96*  CO2 21* 24 26 25 28   GLUCOSE 229* 140* 133* 159* 154*  BUN 26* 25* 26* 29* 29*  CREATININE 1.78* 1.31* 1.26* 1.18 1.27*  CALCIUM 8.8* 9.0 8.8* 8.9 9.0  MG  --   --   --  2.6*  --    Liver Function Tests: No results for input(s): "AST", "ALT", "ALKPHOS", "BILITOT", "PROT", "ALBUMIN" in the last 168 hours. CBG: Recent Labs  Lab 08/29/22 1120 08/29/22 1636 08/29/22 1936 08/30/22 0805 08/30/22 1159  GLUCAP 183* 142* 191* 162* 121*    Discharge time spent: less than 30 minutes.  Signed: Ezekiel Slocumb, DO [facility] 08/30/2022

## 2022-11-09 ENCOUNTER — Other Ambulatory Visit: Payer: Self-pay

## 2022-11-09 ENCOUNTER — Encounter: Payer: BC Managed Care – PPO | Attending: Internal Medicine

## 2022-11-09 DIAGNOSIS — R0609 Other forms of dyspnea: Secondary | ICD-10-CM

## 2022-11-09 DIAGNOSIS — U099 Post covid-19 condition, unspecified: Secondary | ICD-10-CM

## 2022-11-09 NOTE — Progress Notes (Signed)
Virtual Visit completed. Patient informed on EP and RD appointment and 6 Minute walk test. Patient also informed of patient health questionnaires on My Chart. Patient Verbalizes understanding. Visit diagnosis can be found in [facility] 10/19/2022.

## 2022-11-16 ENCOUNTER — Encounter: Payer: BC Managed Care – PPO | Attending: Internal Medicine | Admitting: *Deleted

## 2022-11-16 VITALS — Ht 73.0 in | Wt 349.8 lb

## 2022-11-16 DIAGNOSIS — R0609 Other forms of dyspnea: Secondary | ICD-10-CM | POA: Diagnosis present

## 2022-11-16 DIAGNOSIS — U099 Post covid-19 condition, unspecified: Secondary | ICD-10-CM | POA: Diagnosis present

## 2022-11-16 NOTE — Patient Instructions (Signed)
Patient Instructions  Patient Details  Name: Hector Hancock MRN: [id number] Date of Birth: [birth year] Referring Provider:  Santiago Bur, MD  Below are your personal goals for exercise, nutrition, and risk factors. Our goal is to help you stay on track towards obtaining and maintaining these goals. We will be discussing your progress on these goals with you throughout the program.  Initial Exercise Prescription:  Initial Exercise Prescription - 11/16/22 1600       Date of Initial Exercise RX and Referring Provider   Date 11/16/22    Referring Provider K. Sapp      Oxygen   Maintain Oxygen Saturation 88% or higher      NuStep   Level 1    SPM 80    Minutes 15    METs 1.36      T5 Nustep   Level 1    SPM 80    Minutes 15    METs 1.36      Biostep-RELP   Level 1    SPM 50    Minutes 15    METs 1.36      Track   Laps 10    Minutes 15    METs 1.54      Prescription Details   Frequency (times per week) 2    Duration Progress to 30 minutes of continuous aerobic without signs/symptoms of physical distress      Intensity   THRR 40-80% of Max Heartrate 117-147    Ratings of Perceived Exertion 11-13    Perceived Dyspnea 0-4      Progression   Progression Continue to progress workloads to maintain intensity without signs/symptoms of physical distress.      Resistance Training   Training Prescription Yes    Weight 4    Reps 10-15             Exercise Goals: Frequency: Be able to perform aerobic exercise two to three times per week in program working toward 2-5 days per week of home exercise.  Intensity: Work with a perceived exertion of 11 (fairly light) - 15 (hard) while following your exercise prescription.  We will make changes to your prescription with you as you progress through the program.   Duration: Be able to do 30 to 45 minutes of continuous aerobic exercise in addition to a 5 minute warm-up and a 5 minute cool-down routine.   Nutrition  Goals: Your personal nutrition goals will be established when you do your nutrition analysis with the dietician.  The following are general nutrition guidelines to follow: Cholesterol < 200mg /day Sodium < 1500mg /day Fiber: Men over 50 yrs - 30 grams per day  Personal Goals:  Personal Goals and Risk Factors at Admission - 11/16/22 1652       Core Components/Risk Factors/Patient Goals on Admission    Weight Management Yes;Weight Loss    Intervention Weight Management: Develop a combined nutrition and exercise program designed to reach desired caloric intake, while maintaining appropriate intake of nutrient and fiber, sodium and fats, and appropriate energy expenditure required for the weight goal.;Weight Management: Provide education and appropriate resources to help participant work on and attain dietary goals.;Weight Management/Obesity: Establish reasonable short term and long term weight goals.;Obesity: Provide education and appropriate resources to help participant work on and attain dietary goals.    Admit Weight 349 lb 12.8 oz (158.7 kg)    Goal Weight: Short Term 340 lb (154.2 kg)    Goal Weight: Long Term 300 lb (136.1  kg)    Expected Outcomes Short Term: Continue to assess and modify interventions until short term weight is achieved;Long Term: Adherence to nutrition and physical activity/exercise program aimed toward attainment of established weight goal;Weight Loss: Understanding of general recommendations for a balanced deficit meal plan, which promotes 1-2 lb weight loss per week and includes a negative energy balance of [phone] kcal/d;Understanding recommendations for meals to include 15-35% energy as protein, 25-35% energy from fat, 35-60% energy from carbohydrates, less than 200mg  of dietary cholesterol, 20-35 gm of total fiber daily;Understanding of distribution of calorie intake throughout the day with the consumption of 4-5 meals/snacks    Improve shortness of breath with ADL's  Yes    Intervention Provide education, individualized exercise plan and daily activity instruction to help decrease symptoms of SOB with activities of daily living.    Expected Outcomes Short Term: Improve cardiorespiratory fitness to achieve a reduction of symptoms when performing ADLs;Long Term: Be able to perform more ADLs without symptoms or delay the onset of symptoms    Diabetes Yes    Intervention Provide education about signs/symptoms and action to take for hypo/hyperglycemia.;Provide education about proper nutrition, including hydration, and aerobic/resistive exercise prescription along with prescribed medications to achieve blood glucose in normal ranges: Fasting glucose 65-99 mg/dL    Expected Outcomes Long Term: Attainment of HbA1C < 7%.;Short Term: Participant verbalizes understanding of the signs/symptoms and immediate care of hyper/hypoglycemia, proper foot care and importance of medication, aerobic/resistive exercise and nutrition plan for blood glucose control.    Heart Failure Yes    Intervention Provide a combined exercise and nutrition program that is supplemented with education, support and counseling about heart failure. Directed toward relieving symptoms such as shortness of breath, decreased exercise tolerance, and extremity edema.    Expected Outcomes Improve functional capacity of life;Short term: Attendance in program 2-3 days a week with increased exercise capacity. Reported lower sodium intake. Reported increased fruit and vegetable intake. Reports medication compliance.;Short term: Daily weights obtained and reported for increase. Utilizing diuretic protocols set by physician.;Long term: Adoption of self-care skills and reduction of barriers for early signs and symptoms recognition and intervention leading to self-care maintenance.    Hypertension Yes    Intervention Provide education on lifestyle modifcations including regular physical activity/exercise, weight management,  moderate sodium restriction and increased consumption of fresh fruit, vegetables, and low fat dairy, alcohol moderation, and smoking cessation.;Monitor prescription use compliance.    Expected Outcomes Short Term: Continued assessment and intervention until BP is < 140/14mm HG in hypertensive participants. < 130/57mm HG in hypertensive participants with diabetes, heart failure or chronic kidney disease.;Long Term: Maintenance of blood pressure at goal levels.    Lipids Yes    Intervention Provide education and support for participant on nutrition & aerobic/resistive exercise along with prescribed medications to achieve LDL 70mg , HDL >40mg .    Expected Outcomes Short Term: Participant states understanding of desired cholesterol values and is compliant with medications prescribed. Participant is following exercise prescription and nutrition guidelines.;Long Term: Cholesterol controlled with medications as prescribed, with individualized exercise RX and with personalized nutrition plan. Value goals: LDL < 70mg , HDL > 40 mg.             Tobacco Use Initial Evaluation: Social History   Tobacco Use  Smoking Status Never  Smokeless Tobacco Never    Exercise Goals and Review:  Exercise Goals     Row Name 11/16/22 [phone]  Exercise Goals   Increase Physical Activity Yes       Intervention Provide advice, education, support and counseling about physical activity/exercise needs.;Develop an individualized exercise prescription for aerobic and resistive training based on initial evaluation findings, risk stratification, comorbidities and participant's personal goals.       Expected Outcomes Short Term: Attend rehab on a regular basis to increase amount of physical activity.;Long Term: Add in home exercise to make exercise part of routine and to increase amount of physical activity.;Long Term: Exercising regularly at least 3-5 days a week.       Increase Strength and Stamina Yes        Intervention Provide advice, education, support and counseling about physical activity/exercise needs.;Develop an individualized exercise prescription for aerobic and resistive training based on initial evaluation findings, risk stratification, comorbidities and participant's personal goals.       Expected Outcomes Short Term: Increase workloads from initial exercise prescription for resistance, speed, and METs.;Short Term: Perform resistance training exercises routinely during rehab and add in resistance training at home;Long Term: Improve cardiorespiratory fitness, muscular endurance and strength as measured by increased METs and functional capacity (6MWT)       Able to understand and use rate of perceived exertion (RPE) scale Yes       Intervention Provide education and explanation on how to use RPE scale       Expected Outcomes Short Term: Able to use RPE daily in rehab to express subjective intensity level;Long Term:  Able to use RPE to guide intensity level when exercising independently       Able to understand and use Dyspnea scale Yes       Intervention Provide education and explanation on how to use Dyspnea scale       Expected Outcomes Short Term: Able to use Dyspnea scale daily in rehab to express subjective sense of shortness of breath during exertion;Long Term: Able to use Dyspnea scale to guide intensity level when exercising independently       Knowledge and understanding of Target Heart Rate Range (THRR) Yes       Intervention Provide education and explanation of THRR including how the numbers were predicted and where they are located for reference       Expected Outcomes Short Term: Able to state/look up THRR;Long Term: Able to use THRR to govern intensity when exercising independently;Short Term: Able to use daily as guideline for intensity in rehab       Able to check pulse independently Yes       Intervention Provide education and demonstration on how to check pulse in carotid and  radial arteries.;Review the importance of being able to check your own pulse for safety during independent exercise       Expected Outcomes Short Term: Able to explain why pulse checking is important during independent exercise       Understanding of Exercise Prescription Yes       Intervention Provide education, explanation, and written materials on patient's individual exercise prescription       Expected Outcomes Short Term: Able to explain program exercise prescription;Long Term: Able to explain home exercise prescription to exercise independently                Copy of goals given to participant.

## 2022-11-16 NOTE — Progress Notes (Signed)
Pulmonary Individual Treatment Plan  Patient Details  Name: Hector Hancock MRN: [id number] Date of Birth: 1965/11/12 Referring Provider:   Flowsheet Row Pulmonary Rehab from 11/16/2022 in [facility] Cardiac and Pulmonary Rehab  Referring Provider K. 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 blood glucose meter kit preferred by patient's insurance. Dx: Diabetes, E11.9, Disp: , Rfl:    butalbital-acetaminophen-caffeine (FIORICET) 50-325-40 MG tablet, Take 1-2 tablets by mouth every 6 (six) hours as needed for headache., Disp: 14 tablet, Rfl: 0   diphenhydrAMINE (BENADRYL) 25 mg capsule, Take 50 mg by mouth at bedtime., Disp: , Rfl:    empagliflozin (JARDIANCE) 10 MG TABS tablet, Take by mouth., Disp: , Rfl:    enalapril (VASOTEC) 5 MG tablet, TAKE 1 TABLET (5 MG TOTAL) BY MOUTH DAILY., Disp: 90 tablet, Rfl: 2   enalapril (VASOTEC) 5 MG tablet, Take by mouth. (Patient not taking: Reported on 11/09/2022), Disp: , Rfl:    ENTRESTO 24-26 MG, Take 0.5 tablets by mouth 2 (two) times daily., Disp: , Rfl:    hydroxychloroquine (PLAQUENIL) 200 MG tablet, Take 200 mg by mouth 2 (two) times daily., Disp: , Rfl:    hydroxychloroquine (PLAQUENIL) 200 MG tablet, Take by mouth. (Patient not taking: Reported on 11/09/2022), Disp: , Rfl:    JARDIANCE 25 MG TABS tablet, Take 25 mg by mouth every morning., Disp: , Rfl:    OZEMPIC, 0.25 OR 0.5 MG/DOSE, 2 MG/3ML SOPN, Inject 0.5 mg into the skin once a week. sunday (Patient not taking: Reported on 11/09/2022), Disp: , Rfl:    OZEMPIC, 1 MG/DOSE, 4 MG/3ML SOPN, Inject 1 mg into the skin once a week., Disp: , Rfl:    rivaroxaban (XARELTO) 20 MG TABS tablet, Take 1 tablet (20  mg total) by mouth daily with supper. Starting 09/10/2022, Disp: 30 tablet, Rfl: 3   rosuvastatin (CRESTOR) 10 MG tablet, Take 10 mg by mouth at bedtime. (Patient not taking: Reported on 11/09/2022), Disp: , Rfl:    spironolactone (ALDACTONE) 25 MG tablet, Take 1 tablet (25 mg total) by mouth daily. (Patient not taking: Reported on 11/09/2022), Disp: 30 tablet, Rfl: 3   spironolactone (ALDACTONE) 50 MG tablet, Take 1 tablet by mouth daily., Disp: , Rfl:    tapentadol (NUCYNTA) 50 MG tablet, , Disp: , Rfl:    torsemide (DEMADEX) 20 MG tablet, Take 1 tablet (20 mg total) by mouth daily. If increasing weight or edema, take a second 20mg  dose in evening, Disp: 30 tablet, Rfl: 3   torsemide (DEMADEX) 20 MG tablet, Take by mouth. (Patient not taking: Reported on 11/09/2022), Disp: , Rfl:    XARELTO 15 MG TABS tablet, Take 1 tablet (15 mg total) by mouth 2 (two) times daily with a meal for 10 days. Through Jan. 24th.  On Jan 25th, start taking 20 mg once daily with a meal., Disp: 20 tablet, Rfl: 0  Past Medical History: Past Medical History:  Diagnosis Date   Acute respiratory failure with hypoxia ([place]) 08/26/2022   Arthritis    History of kidney stones    Hypertension    Hypokalemia 08/29/2022    Tobacco Use: Social History   Tobacco Use  Smoking Status  Never  Smokeless Tobacco Never    Labs: Review Flowsheet       Latest Ref Rng & Units 12/30/2011 06/18/2014 06/19/2014 08/26/2022  Labs for ITP Cardiac and Pulmonary Rehab  Cholestrol 0 - 200 mg/dL 164  165  140  -  LDL (calc) 0 - 100 mg/dL 102  91  83  -  HDL-C 40 - 60 mg/dL 46  35  29  -  Trlycerides 0 - 200 mg/dL 78  195  140  -  Hemoglobin A1c 4.8 - 5.6 % 5.6  - - 6.9      Pulmonary Assessment Scores:  Pulmonary Assessment Scores     Row Name 11/16/22 1655         ADL UCSD   ADL Phase Entry     SOB Score total 94     Rest 0     Walk 4     Stairs 5     Bath 5     Dress 4     Shop 5       CAT Score   CAT Score 21        mMRC Score   mMRC Score 2              UCSD: Self-administered rating of dyspnea associated with activities of daily living (ADLs) 6-point scale (0 = "not at all" to 5 = "maximal or unable to do because of breathlessness")  Scoring Scores range from 0 to 120.  Minimally important difference is 5 units  CAT: CAT can identify the health impairment of COPD patients and is better correlated with disease progression.  CAT has a scoring range of zero to 40. The CAT score is classified into four groups of low (less than 10), medium (10 - 20), high (21-30) and very high (31-40) based on the impact level of disease on health status. A CAT score over 10 suggests significant symptoms.  A worsening CAT score could be explained by an exacerbation, poor medication adherence, poor inhaler technique, or progression of COPD or comorbid conditions.  CAT MCID is 2 points  mMRC: mMRC (Modified Medical Research Council) Dyspnea Scale is used to assess the degree of baseline functional disability in patients of respiratory disease due to dyspnea. No minimal important difference is established. A decrease in score of 1 point or greater is considered a positive change.   Pulmonary Function Assessment:  Pulmonary Function Assessment - 11/09/22 1030       Breath   Shortness of Breath Yes;Limiting activity             Exercise Target Goals: Exercise Program Goal: Individual exercise prescription set using results from initial 6 min walk test and THRR while considering  patient's activity barriers and safety.   Exercise Prescription Goal: Initial exercise prescription builds to 30-45 minutes a day of aerobic activity, 2-3 days per week.  Home exercise guidelines will be given to patient during program as part of exercise prescription that the participant will acknowledge.  Education: Aerobic Exercise: - Group verbal and visual presentation on the components of exercise prescription. Introduces F.I.T.T  principle from ACSM for exercise prescriptions.  Reviews F.I.T.T. principles of aerobic exercise including progression. Written material given at graduation.   Education: Resistance Exercise: - Group verbal and visual presentation on the components of exercise prescription. Introduces F.I.T.T principle from ACSM for exercise prescriptions  Reviews F.I.T.T. principles of resistance exercise including progression. Written material given at graduation.    Education: Exercise &  Equipment Safety: - Individual verbal instruction and demonstration of equipment use and safety with use of the equipment. Flowsheet Row Pulmonary Rehab from 11/16/2022 in [facility] Cardiac and Pulmonary Rehab  Date 11/16/22  Educator [facility]  Instruction Review Code 1- Verbalizes Understanding       Education: Exercise Physiology & General Exercise Guidelines: - Group verbal and written instruction with models to review the exercise physiology of the cardiovascular system and associated critical values. Provides general exercise guidelines with specific guidelines to those with heart or lung disease.    Education: Flexibility, Balance, Mind/Body Relaxation: - Group verbal and visual presentation with interactive activity on the components of exercise prescription. Introduces F.I.T.T principle from ACSM for exercise prescriptions. Reviews F.I.T.T. principles of flexibility and balance exercise training including progression. Also discusses the mind body connection.  Reviews various relaxation techniques to help reduce and manage stress (i.e. Deep breathing, progressive muscle relaxation, and visualization). Balance handout provided to take home. Written material given at graduation.   Activity Barriers & Risk Stratification:  Activity Barriers & Cardiac Risk Stratification - 11/16/22 1643       Activity Barriers & Cardiac Risk Stratification   Activity Barriers Other (comment)    Comments knee pain in both knees, carpel tunnel              6 Minute Walk:  6 Minute Walk     Row Name 11/16/22 1640         6 Minute Walk   Phase Initial     Distance 750 feet     Walk Time 4.5 minutes     # of Rest Breaks 2     MPH 1.89     METS 1.36     RPE 15     Perceived Dyspnea  3     VO2 Peak 4.76     Symptoms Yes (comment)     Comments SOB     Resting HR 87 bpm     Resting BP 102/62     Resting Oxygen Saturation  94 %     Exercise Oxygen Saturation  during 6 min walk 93 %     Max Ex. HR 112 bpm     Max Ex. BP 112/60     2 Minute Post BP 104/64       Interval HR   1 Minute HR 104     2 Minute HR 107     3 Minute HR 93  taken during rest break     4 Minute HR 103     5 Minute HR 111     6 Minute HR 112     2 Minute Post HR 95     Interval Heart Rate? Yes       Interval Oxygen   Interval Oxygen? Yes     Baseline Oxygen Saturation % 94 %     1 Minute Oxygen Saturation % 93 %     1 Minute Liters of Oxygen 0 L     2 Minute Oxygen Saturation % 94 %     2 Minute Liters of Oxygen 0 L     3 Minute Oxygen Saturation % 96 %     3 Minute Liters of Oxygen 0 L     4 Minute Oxygen Saturation % 96 %     4 Minute Liters of Oxygen 0 L     5 Minute Oxygen Saturation % 95 %     5 Minute Liters of Oxygen  0 L     6 Minute Oxygen Saturation % 95 %     6 Minute Liters of Oxygen 0 L     2 Minute Post Oxygen Saturation % 97 %     2 Minute Post Liters of Oxygen 0 L             Oxygen Initial Assessment:  Oxygen Initial Assessment - 11/16/22 1658       Home Oxygen   Home Oxygen Device None    Sleep Oxygen Prescription CPAP    Home Exercise Oxygen Prescription None    Home Resting Oxygen Prescription None    Compliance with Home Oxygen Use Yes      Initial 6 min Walk   Oxygen Used None      Program Oxygen Prescription   Program Oxygen Prescription None      Intervention   Short Term Goals To learn and demonstrate proper pursed lip breathing techniques or other breathing techniques. ;To learn and  understand importance of monitoring SPO2 with pulse oximeter and demonstrate accurate use of the pulse oximeter.;To learn and understand importance of maintaining oxygen saturations>88%    Long  Term Goals Maintenance of O2 saturations>88%;Exhibits compliance with exercise, home  and travel O2 prescription;Exhibits proper breathing techniques, such as pursed lip breathing or other method taught during program session;Verbalizes importance of monitoring SPO2 with pulse oximeter and return demonstration             Oxygen Re-Evaluation:   Oxygen Discharge (Final Oxygen Re-Evaluation):   Initial Exercise Prescription:  Initial Exercise Prescription - 11/16/22 1600       Date of Initial Exercise RX and Referring Provider   Date 11/16/22    Referring Provider K. Sapp      Oxygen   Maintain Oxygen Saturation 88% or higher      NuStep   Level 1    SPM 80    Minutes 15    METs 1.36      T5 Nustep   Level 1    SPM 80    Minutes 15    METs 1.36      Biostep-RELP   Level 1    SPM 50    Minutes 15    METs 1.36      Track   Laps 10    Minutes 15    METs 1.54      Prescription Details   Frequency (times per week) 2    Duration Progress to 30 minutes of continuous aerobic without signs/symptoms of physical distress      Intensity   THRR 40-80% of Max Heartrate 117-147    Ratings of Perceived Exertion 11-13    Perceived Dyspnea 0-4      Progression   Progression Continue to progress workloads to maintain intensity without signs/symptoms of physical distress.      Resistance Training   Training Prescription Yes    Weight 4    Reps 10-15             Perform Capillary Blood Glucose checks as needed.  Exercise Prescription Changes:   Exercise Prescription Changes     Row Name 11/16/22 1600             Response to Exercise   Blood Pressure (Admit) 102/62       Blood Pressure (Exercise) 112/60       Blood Pressure (Exit) 104/64       Heart Rate  (Admit) 87 bpm  Heart Rate (Exercise) 112 bpm       Heart Rate (Exit) 95 bpm       Oxygen Saturation (Admit) 94 %       Oxygen Saturation (Exercise) 93 %       Oxygen Saturation (Exit) 97 %       Rating of Perceived Exertion (Exercise) 15       Perceived Dyspnea (Exercise) 3       Symptoms SOB       Comments 6 MWT results                Exercise Comments:   Exercise Goals and Review:   Exercise Goals     Row Name 11/16/22 1648             Exercise Goals   Increase Physical Activity Yes       Intervention Provide advice, education, support and counseling about physical activity/exercise needs.;Develop an individualized exercise prescription for aerobic and resistive training based on initial evaluation findings, risk stratification, comorbidities and participant's personal goals.       Expected Outcomes Short Term: Attend rehab on a regular basis to increase amount of physical activity.;Long Term: Add in home exercise to make exercise part of routine and to increase amount of physical activity.;Long Term: Exercising regularly at least 3-5 days a week.       Increase Strength and Stamina Yes       Intervention Provide advice, education, support and counseling about physical activity/exercise needs.;Develop an individualized exercise prescription for aerobic and resistive training based on initial evaluation findings, risk stratification, comorbidities and participant's personal goals.       Expected Outcomes Short Term: Increase workloads from initial exercise prescription for resistance, speed, and METs.;Short Term: Perform resistance training exercises routinely during rehab and add in resistance training at home;Long Term: Improve cardiorespiratory fitness, muscular endurance and strength as measured by increased METs and functional capacity (6MWT)       Able to understand and use rate of perceived exertion (RPE) scale Yes       Intervention Provide education and  explanation on how to use RPE scale       Expected Outcomes Short Term: Able to use RPE daily in rehab to express subjective intensity level;Long Term:  Able to use RPE to guide intensity level when exercising independently       Able to understand and use Dyspnea scale Yes       Intervention Provide education and explanation on how to use Dyspnea scale       Expected Outcomes Short Term: Able to use Dyspnea scale daily in rehab to express subjective sense of shortness of breath during exertion;Long Term: Able to use Dyspnea scale to guide intensity level when exercising independently       Knowledge and understanding of Target Heart Rate Range (THRR) Yes       Intervention Provide education and explanation of THRR including how the numbers were predicted and where they are located for reference       Expected Outcomes Short Term: Able to state/look up THRR;Long Term: Able to use THRR to govern intensity when exercising independently;Short Term: Able to use daily as guideline for intensity in rehab       Able to check pulse independently Yes       Intervention Provide education and demonstration on how to check pulse in carotid and radial arteries.;Review the importance of being able to check your own pulse for safety  during independent exercise       Expected Outcomes Short Term: Able to explain why pulse checking is important during independent exercise       Understanding of Exercise Prescription Yes       Intervention Provide education, explanation, and written materials on patient's individual exercise prescription       Expected Outcomes Short Term: Able to explain program exercise prescription;Long Term: Able to explain home exercise prescription to exercise independently                Exercise Goals Re-Evaluation :   Discharge Exercise Prescription (Final Exercise Prescription Changes):  Exercise Prescription Changes - 11/16/22 1600       Response to Exercise   Blood Pressure  (Admit) 102/62    Blood Pressure (Exercise) 112/60    Blood Pressure (Exit) 104/64    Heart Rate (Admit) 87 bpm    Heart Rate (Exercise) 112 bpm    Heart Rate (Exit) 95 bpm    Oxygen Saturation (Admit) 94 %    Oxygen Saturation (Exercise) 93 %    Oxygen Saturation (Exit) 97 %    Rating of Perceived Exertion (Exercise) 15    Perceived Dyspnea (Exercise) 3    Symptoms SOB    Comments 6 MWT results             Nutrition:  Target Goals: Understanding of nutrition guidelines, daily intake of sodium 1500mg , cholesterol 200mg , calories 30% from fat and 7% or less from saturated fats, daily to have 5 or more servings of fruits and vegetables.  Education: All About Nutrition: -Group instruction provided by verbal, written material, interactive activities, discussions, models, and posters to present general guidelines for heart healthy nutrition including fat, fiber, MyPlate, the role of sodium in heart healthy nutrition, utilization of the nutrition label, and utilization of this knowledge for meal planning. Follow up email sent as well. Written material given at graduation.   Biometrics:  Pre Biometrics - 11/16/22 1649       Pre Biometrics   Height 6\' 1"  (1.854 m)    Weight 349 lb 12.8 oz (158.7 kg)    Waist Circumference 57 inches    Hip Circumference 52 inches    Waist to Hip Ratio 1.1 %    BMI (Calculated) 46.16    Single Leg Stand 3.16 seconds              Nutrition Therapy Plan and Nutrition Goals:  Nutrition Therapy & Goals - 11/16/22 1427       Nutrition Therapy   Diet Heart healthy, low Na, T2DM MNT    Drug/Food Interactions Statins/Certain Fruits    Protein (specify units) 150g    Fiber 30 grams    Whole Grain Foods 3 servings    Saturated Fats 16 max. grams    Fruits and Vegetables 8 servings/day    Sodium 1.5 grams      Personal Nutrition Goals   Nutrition Goal ST: pair CHO rich foods with fiber, protein, and heart healthy fat - peanut butter with  fruit, greek yogurt with fruit, vegetables and egg in grits, whole grain english muffin with scrambled eggs, etc. LT: Maintain A1C <7, limit Na <1.5g/day, continue with changes made    Comments 58 y.o. M admitted to pulmonary rehab for covid 19 condition. PMHx includes HTN, chronic diastolic CHF, [id number], OSA. Medications reviewed jardiance, ozempic, xarelto, crestor, torsemide. Labs reviewed. He is mindful of his sodium and added sugar. He tries to use more  fresh foods and he limits eating out. He reports making changes to his diet in 2022 - especially limiting sodium. he reports his MD increased his dose of ozempic about 3 weeks ago and he is doing well on it. B: grits or piece of fruit (ornage or banana) or scrambled egg in microwave. L: fruit or half sandwich or leftovers D: pork chop, meat with 2 vegetables. He aims to have grilled foods. His mother in law lives with them. Drinks: water, occassionally diet soda or diet tea. He uses olive oil, he does not use butter. Discussed heart healthy eating and T2DM MNT.      Intervention Plan   Intervention Prescribe, educate and counsel regarding individualized specific dietary modifications aiming towards targeted core components such as weight, hypertension, lipid management, diabetes, heart failure and other comorbidities.;Nutrition handout(s) given to patient.    Expected Outcomes Short Term Goal: Understand basic principles of dietary content, such as calories, fat, sodium, cholesterol and nutrients.;Short Term Goal: A plan has been developed with personal nutrition goals set during dietitian appointment.;Long Term Goal: Adherence to prescribed nutrition plan.             Nutrition Assessments:  MEDIFICTS Score Key: ?70 Need to make dietary changes  40-70 Heart Healthy Diet ? 40 Therapeutic Level Cholesterol Diet   Picture Your Plate Scores: D34-534 Unhealthy dietary pattern with much room for improvement. 41-50 Dietary pattern unlikely to meet  recommendations for good health and room for improvement. 51-60 More healthful dietary pattern, with some room for improvement.  >60 Healthy dietary pattern, although there may be some specific behaviors that could be improved.   Nutrition Goals Re-Evaluation:   Nutrition Goals Discharge (Final Nutrition Goals Re-Evaluation):   Psychosocial: Target Goals: Acknowledge presence or absence of significant depression and/or stress, maximize coping skills, provide positive support system. Participant is able to verbalize types and ability to use techniques and skills needed for reducing stress and depression.   Education: Stress, Anxiety, and Depression - Group verbal and visual presentation to define topics covered.  Reviews how body is impacted by stress, anxiety, and depression.  Also discusses healthy ways to reduce stress and to treat/manage anxiety and depression.  Written material given at graduation.   Education: Sleep Hygiene -Provides group verbal and written instruction about how sleep can affect your health.  Define sleep hygiene, discuss sleep cycles and impact of sleep habits. Review good sleep hygiene tips.    Initial Review & Psychosocial Screening:  Initial Psych Review & Screening - 11/09/22 1034       Initial Review   Current issues with Current Stress Concerns    Source of Stress Concerns Chronic Illness    Comments Konrad Dolores states that his health has been stressing him out. He had blood clots and has been having alot of bouts with shortness of breath. He has the stress of medical bills and not working.      Family Dynamics   Good Support System? Yes    Comments He can look to his wife, son, parents and four sisters for support.      Barriers   Psychosocial barriers to participate in program The patient should benefit from training in stress management and relaxation.      Screening Interventions   Interventions Encouraged to exercise;To provide support and resources  with identified psychosocial needs;Provide feedback about the scores to participant    Expected Outcomes Short Term goal: Utilizing psychosocial counselor, staff and physician to assist with identification of  specific Stressors or current issues interfering with healing process. Setting desired goal for each stressor or current issue identified.;Long Term Goal: Stressors or current issues are controlled or eliminated.;Short Term goal: Identification and review with participant of any Quality of Life or Depression concerns found by scoring the questionnaire.;Long Term goal: The participant improves quality of Life and PHQ9 Scores as seen by post scores and/or verbalization of changes             Quality of Life Scores:  Scores of 19 and below usually indicate a poorer quality of life in these areas.  A difference of  2-3 points is a clinically meaningful difference.  A difference of 2-3 points in the total score of the Quality of Life Index has been associated with significant improvement in overall quality of life, self-image, physical symptoms, and general health in studies assessing change in quality of life.  PHQ-9: Review Flowsheet       11/16/2022 04/24/2021  Depression screen PHQ 2/9  Decreased Interest 0 0  Down, Depressed, Hopeless 0 0  PHQ - 2 Score 0 0  Altered sleeping 3 -  Tired, decreased energy 1 -  Change in appetite 1 -  Feeling bad or failure about yourself  3 -  Trouble concentrating 1 -  Moving slowly or fidgety/restless 0 -  Suicidal thoughts 0 -  PHQ-9 Score 9 -  Difficult doing work/chores Extremely dIfficult -   Interpretation of Total Score  Total Score Depression Severity:  1-4 = Minimal depression, 5-9 = Mild depression, 10-14 = Moderate depression, 15-19 = Moderately severe depression, 20-27 = Severe depression   Psychosocial Evaluation and Intervention:  Psychosocial Evaluation - 11/09/22 1036       Psychosocial Evaluation & Interventions    Interventions Encouraged to exercise with the program and follow exercise prescription;Relaxation education;Stress management education    Comments Konrad Dolores states that his health has been stressing him out. He had blood clots and has been having alot of bouts with shortness of breath. He has the stress of medical bills and not working.He can look to his wife, son, parents and four sisters for support.    Expected Outcomes Short: Start [facility] to help with mood. Long: Maintain a healthy mental state.    Continue Psychosocial Services  Follow up required by staff             Psychosocial Re-Evaluation:   Psychosocial Discharge (Final Psychosocial Re-Evaluation):   Education: Education Goals: Education classes will be provided on a weekly basis, covering required topics. Participant will state understanding/return demonstration of topics presented.  Learning Barriers/Preferences:  Learning Barriers/Preferences - 11/09/22 1031       Learning Barriers/Preferences   Learning Barriers None    Learning Preferences None             General Pulmonary Education Topics:  Infection Prevention: - Provides verbal and written material to individual with discussion of infection control including proper hand washing and proper equipment cleaning during exercise session. Flowsheet Row Pulmonary Rehab from 11/16/2022 in [facility] Cardiac and Pulmonary Rehab  Date 11/16/22  Educator [facility]  Instruction Review Code 1- Verbalizes Understanding       Falls Prevention: - Provides verbal and written material to individual with discussion of falls prevention and safety. Flowsheet Row Pulmonary Rehab from 11/16/2022 in [facility] Cardiac and Pulmonary Rehab  Date 11/16/22  Educator [facility]  Instruction Review Code 1- Verbalizes Understanding       Chronic Lung Disease Review: - Group  verbal instruction with posters, models, PowerPoint presentations and videos,  to review new updates, new respiratory  medications, new advancements in procedures and treatments. Providing information on websites and "800" numbers for continued self-education. Includes information about supplement oxygen, available portable oxygen systems, continuous and intermittent flow rates, oxygen safety, concentrators, and Medicare reimbursement for oxygen. Explanation of Pulmonary Drugs, including class, frequency, complications, importance of spacers, rinsing mouth after steroid MDI's, and proper cleaning methods for nebulizers. Review of basic lung anatomy and physiology related to function, structure, and complications of lung disease. Review of risk factors. Discussion about methods for diagnosing sleep apnea and types of masks and machines for OSA. Includes a review of the use of types of environmental controls: home humidity, furnaces, filters, dust mite/pet prevention, HEPA vacuums. Discussion about weather changes, air quality and the benefits of nasal washing. Instruction on Warning signs, infection symptoms, calling MD promptly, preventive modes, and value of vaccinations. Review of effective airway clearance, coughing and/or vibration techniques. Emphasizing that all should Create an Action Plan. Written material given at graduation. Flowsheet Row Pulmonary Rehab from 11/16/2022 in [facility] Cardiac and Pulmonary Rehab  Education need identified 11/16/22       AED/CPR: - Group verbal and written instruction with the use of models to demonstrate the basic use of the AED with the basic ABC's of resuscitation.    Anatomy and Cardiac Procedures: - Group verbal and visual presentation and models provide information about basic cardiac anatomy and function. Reviews the testing methods done to diagnose heart disease and the outcomes of the test results. Describes the treatment choices: Medical Management, Angioplasty, or Coronary Bypass Surgery for treating various heart conditions including Myocardial Infarction, Angina, Valve  Disease, and Cardiac Arrhythmias.  Written material given at graduation.   Medication Safety: - Group verbal and visual instruction to review commonly prescribed medications for heart and lung disease. Reviews the medication, class of the drug, and side effects. Includes the steps to properly store meds and maintain the prescription regimen.  Written material given at graduation.   Other: -Provides group and verbal instruction on various topics (see comments)   Knowledge Questionnaire Score:  Knowledge Questionnaire Score - 11/16/22 1650       Knowledge Questionnaire Score   Pre Score 15/18              Core Components/Risk Factors/Patient Goals at Admission:  Personal Goals and Risk Factors at Admission - 11/16/22 1652       Core Components/Risk Factors/Patient Goals on Admission    Weight Management Yes;Weight Loss    Intervention Weight Management: Develop a combined nutrition and exercise program designed to reach desired caloric intake, while maintaining appropriate intake of nutrient and fiber, sodium and fats, and appropriate energy expenditure required for the weight goal.;Weight Management: Provide education and appropriate resources to help participant work on and attain dietary goals.;Weight Management/Obesity: Establish reasonable short term and long term weight goals.;Obesity: Provide education and appropriate resources to help participant work on and attain dietary goals.    Admit Weight 349 lb 12.8 oz (158.7 kg)    Goal Weight: Short Term 340 lb (154.2 kg)    Goal Weight: Long Term 300 lb (136.1 kg)    Expected Outcomes Short Term: Continue to assess and modify interventions until short term weight is achieved;Long Term: Adherence to nutrition and physical activity/exercise program aimed toward attainment of established weight goal;Weight Loss: Understanding of general recommendations for a balanced deficit meal plan, which promotes 1-2 lb weight loss  per week and  includes a negative energy balance of [id number] kcal/d;Understanding recommendations for meals to include 15-35% energy as protein, 25-35% energy from fat, 35-60% energy from carbohydrates, less than 200mg  of dietary cholesterol, 20-35 gm of total fiber daily;Understanding of distribution of calorie intake throughout the day with the consumption of 4-5 meals/snacks    Improve shortness of breath with ADL's Yes    Intervention Provide education, individualized exercise plan and daily activity instruction to help decrease symptoms of SOB with activities of daily living.    Expected Outcomes Short Term: Improve cardiorespiratory fitness to achieve a reduction of symptoms when performing ADLs;Long Term: Be able to perform more ADLs without symptoms or delay the onset of symptoms    Diabetes Yes    Intervention Provide education about signs/symptoms and action to take for hypo/hyperglycemia.;Provide education about proper nutrition, including hydration, and aerobic/resistive exercise prescription along with prescribed medications to achieve blood glucose in normal ranges: Fasting glucose 65-99 mg/dL    Expected Outcomes Long Term: Attainment of HbA1C < 7%.;Short Term: Participant verbalizes understanding of the signs/symptoms and immediate care of hyper/hypoglycemia, proper foot care and importance of medication, aerobic/resistive exercise and nutrition plan for blood glucose control.    Heart Failure Yes    Intervention Provide a combined exercise and nutrition program that is supplemented with education, support and counseling about heart failure. Directed toward relieving symptoms such as shortness of breath, decreased exercise tolerance, and extremity edema.    Expected Outcomes Improve functional capacity of life;Short term: Attendance in program 2-3 days a week with increased exercise capacity. Reported lower sodium intake. Reported increased fruit and vegetable intake. Reports medication compliance.;Short  term: Daily weights obtained and reported for increase. Utilizing diuretic protocols set by physician.;Long term: Adoption of self-care skills and reduction of barriers for early signs and symptoms recognition and intervention leading to self-care maintenance.    Hypertension Yes    Intervention Provide education on lifestyle modifcations including regular physical activity/exercise, weight management, moderate sodium restriction and increased consumption of fresh fruit, vegetables, and low fat dairy, alcohol moderation, and smoking cessation.;Monitor prescription use compliance.    Expected Outcomes Short Term: Continued assessment and intervention until BP is < 140/50mm HG in hypertensive participants. < 130/63mm HG in hypertensive participants with diabetes, heart failure or chronic kidney disease.;Long Term: Maintenance of blood pressure at goal levels.    Lipids Yes    Intervention Provide education and support for participant on nutrition & aerobic/resistive exercise along with prescribed medications to achieve LDL 70mg , HDL >40mg .    Expected Outcomes Short Term: Participant states understanding of desired cholesterol values and is compliant with medications prescribed. Participant is following exercise prescription and nutrition guidelines.;Long Term: Cholesterol controlled with medications as prescribed, with individualized exercise RX and with personalized nutrition plan. Value goals: LDL < 70mg , HDL > 40 mg.             Education:Diabetes - Individual verbal and written instruction to review signs/symptoms of diabetes, desired ranges of glucose level fasting, after meals and with exercise. Acknowledge that pre and post exercise glucose checks will be done for 3 sessions at entry of program. Flowsheet Row Pulmonary Rehab from 11/16/2022 in [facility] Cardiac and Pulmonary Rehab  Date 11/16/22  Educator [facility]  Instruction Review Code 1- Verbalizes Understanding       Know Your Numbers and  Heart Failure: - Group verbal and visual instruction to discuss disease risk factors for cardiac and pulmonary disease and treatment options.  Reviews  associated critical values for Overweight/Obesity, Hypertension, Cholesterol, and Diabetes.  Discusses basics of heart failure: signs/symptoms and treatments.  Introduces Heart Failure Zone chart for action plan for heart failure.  Written material given at graduation.   Core Components/Risk Factors/Patient Goals Review:    Core Components/Risk Factors/Patient Goals at Discharge (Final Review):    ITP Comments:  ITP Comments     Row Name 11/09/22 1028 11/16/22 1639         ITP Comments Virtual Visit completed. Patient informed on EP and RD appointment and 6 Minute walk test. Patient also informed of patient health questionnaires on My Chart. Patient Verbalizes understanding. Visit diagnosis can be found in [facility] 10/19/2022. Completed 6MWT and gym orientation. Initial ITP created and sent for review to Dr. Zetta Bills, Medical Director.               Comments: initial ITP

## 2022-11-19 ENCOUNTER — Ambulatory Visit: Payer: BC Managed Care – PPO

## 2022-11-19 ENCOUNTER — Encounter: Payer: BC Managed Care – PPO | Admitting: *Deleted

## 2022-11-19 DIAGNOSIS — U099 Post covid-19 condition, unspecified: Secondary | ICD-10-CM

## 2022-11-19 DIAGNOSIS — R0609 Other forms of dyspnea: Secondary | ICD-10-CM

## 2022-11-19 LAB — GLUCOSE, CAPILLARY
Glucose-Capillary: 149 mg/dL — ABNORMAL HIGH (ref 70–99)
Glucose-Capillary: 115 mg/dL — ABNORMAL HIGH (ref 70–99)

## 2022-11-19 NOTE — Progress Notes (Signed)
Daily Session Note  Patient Details  Name: Hector Hancock MRN: [id number] Date of Birth: April 04, 1966 Referring Provider:   Flowsheet Row Pulmonary Rehab from 11/16/2022 in [facility] Cardiac and Pulmonary Rehab  Referring Provider K. Sapp       Encounter Date: 11/19/2022  Check In:  Session Check In - 11/19/22 1110       Check-In   Supervising physician immediately available to respond to emergencies See telemetry face sheet for immediately available ER MD    Location [facility]-Cardiac & Pulmonary Rehab    Staff Present Darlyne Russian, RN, Lorin Mercy, MS, ACSM CEP, Exercise Physiologist;Joseph Tessie Fass, [place]    Virtual Visit No    Medication changes reported     No    Fall or balance concerns reported    No    Warm-up and Cool-down Performed on first and last piece of equipment    Resistance Training Performed Yes    VAD Patient? No    PAD/SET Patient? No      Pain Assessment   Currently in Pain? No/denies                Social History   Tobacco Use  Smoking Status Never  Smokeless Tobacco Never    Goals Met:  Independence with exercise equipment Exercise tolerated well No report of concerns or symptoms today Strength training completed today  Goals Unmet:  Not Applicable  Comments: First full day of exercise!  Patient was oriented to gym and equipment including functions, settings, policies, and procedures.  Patient's individual exercise prescription and treatment plan were reviewed.  All starting workloads were established based on the results of the 6 minute walk test done at initial orientation visit.  The plan for exercise progression was also introduced and progression will be customized based on patient's performance and goals.    Dr. Emily Filbert is Medical Director for [facility].  Dr. Ottie Glazier is Medical Director for [facility] Pulmonary Rehabilitation.

## 2022-11-24 ENCOUNTER — Ambulatory Visit: Payer: BC Managed Care – PPO

## 2022-11-26 ENCOUNTER — Ambulatory Visit: Payer: BC Managed Care – PPO

## 2022-11-26 ENCOUNTER — Encounter: Payer: BC Managed Care – PPO | Admitting: *Deleted

## 2022-11-30 ENCOUNTER — Encounter: Payer: BC Managed Care – PPO | Admitting: *Deleted

## 2022-11-30 DIAGNOSIS — U099 Post covid-19 condition, unspecified: Secondary | ICD-10-CM | POA: Diagnosis not present

## 2022-11-30 DIAGNOSIS — R0609 Other forms of dyspnea: Secondary | ICD-10-CM

## 2022-11-30 LAB — GLUCOSE, CAPILLARY
Glucose-Capillary: 105 mg/dL — ABNORMAL HIGH (ref 70–99)
Glucose-Capillary: 78 mg/dL (ref 70–99)
Glucose-Capillary: 99 mg/dL (ref 70–99)

## 2022-11-30 NOTE — Progress Notes (Deleted)
Daily Session Note  Patient Details  Name: Hector Hancock MRN: [id number] Date of Birth: [birth year] Referring Provider:   Flowsheet Row Pulmonary Rehab from 11/16/2022 in [facility] Cardiac and Pulmonary Rehab  Referring Provider K. Sapp       Encounter Date: 11/30/2022  Check In:  Session Check In - 11/30/22 1613       Check-In   Supervising physician immediately available to respond to emergencies See telemetry face sheet for immediately available ER MD    Location [facility]-Cardiac & Pulmonary Rehab    Staff Present Lanny Hurst, RN, ADN;Joseph Hood, RCP,RRT,BSRT;Noah Tickle, BS, Exercise Physiologist    Virtual Visit No    Medication changes reported     No    Fall or balance concerns reported    No    Warm-up and Cool-down Performed on first and last piece of equipment    Resistance Training Performed Yes    VAD Patient? No    PAD/SET Patient? No      Pain Assessment   Currently in Pain? No/denies                Social History   Tobacco Use  Smoking Status Never  Smokeless Tobacco Never    Goals Met:  Independence with exercise equipment Exercise tolerated well No report of concerns or symptoms today Strength training completed today  Goals Unmet:  Not Applicable  Comments: Pt able to follow exercise prescription today without complaint.  Will continue to monitor for progression.    Dr. Bethann Punches is Medical Director for [facility] Cardiac Rehabilitation.  Dr. Vida Rigger is Medical Director for [facility] Pulmonary Rehabilitation.

## 2022-11-30 NOTE — Progress Notes (Signed)
Incomplete Session Note  Patient Details  Name: Hector Hancock MRN: [id number] Date of Birth: 1965/11/16 Referring Provider:   Flowsheet Row Pulmonary Rehab from 11/16/2022 in [facility] Cardiac and Pulmonary Rehab  Referring Provider K. Sapp       Hector Hancock did not complete his rehab session.  Pt not feeling well at rehab today. BP 132/74 upon arrival. 1610 FSBS checked at 78. Glucose gel 15 grams given per protocol.  FSBS re-checked at 99. Pt given crackers as well. Pt reports s/s have resolved. Education provided to contact MD regarding blood sugar management. Pt voiced understanding. Pt stable upon leaving rehab today.

## 2022-12-01 ENCOUNTER — Ambulatory Visit: Payer: BC Managed Care – PPO

## 2022-12-02 ENCOUNTER — Encounter: Payer: Self-pay | Admitting: *Deleted

## 2022-12-02 ENCOUNTER — Encounter: Payer: BC Managed Care – PPO | Admitting: *Deleted

## 2022-12-02 DIAGNOSIS — U099 Post covid-19 condition, unspecified: Secondary | ICD-10-CM

## 2022-12-02 DIAGNOSIS — R0609 Other forms of dyspnea: Secondary | ICD-10-CM

## 2022-12-02 LAB — GLUCOSE, CAPILLARY: Glucose-Capillary: 97 mg/dL (ref 70–99)

## 2022-12-02 NOTE — Progress Notes (Signed)
Pulmonary Individual Treatment Plan  Patient Details  Name: Hector Hancock MRN: [id number] Date of Birth: 1965/11/12 Referring Provider:   Flowsheet Row Pulmonary Rehab from 11/16/2022 in [facility] Cardiac and Pulmonary Rehab  Referring Provider K. 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 blood glucose meter kit preferred by patient's insurance. Dx: Diabetes, E11.9, Disp: , Rfl:    butalbital-acetaminophen-caffeine (FIORICET) 50-325-40 MG tablet, Take 1-2 tablets by mouth every 6 (six) hours as needed for headache., Disp: 14 tablet, Rfl: 0   diphenhydrAMINE (BENADRYL) 25 mg capsule, Take 50 mg by mouth at bedtime., Disp: , Rfl:    empagliflozin (JARDIANCE) 10 MG TABS tablet, Take by mouth., Disp: , Rfl:    enalapril (VASOTEC) 5 MG tablet, TAKE 1 TABLET (5 MG TOTAL) BY MOUTH DAILY., Disp: 90 tablet, Rfl: 2   enalapril (VASOTEC) 5 MG tablet, Take by mouth. (Patient not taking: Reported on 11/09/2022), Disp: , Rfl:    ENTRESTO 24-26 MG, Take 0.5 tablets by mouth 2 (two) times daily., Disp: , Rfl:    hydroxychloroquine (PLAQUENIL) 200 MG tablet, Take 200 mg by mouth 2 (two) times daily., Disp: , Rfl:    hydroxychloroquine (PLAQUENIL) 200 MG tablet, Take by mouth. (Patient not taking: Reported on 11/09/2022), Disp: , Rfl:    JARDIANCE 25 MG TABS tablet, Take 25 mg by mouth every morning., Disp: , Rfl:    OZEMPIC, 0.25 OR 0.5 MG/DOSE, 2 MG/3ML SOPN, Inject 0.5 mg into the skin once a week. sunday (Patient not taking: Reported on 11/09/2022), Disp: , Rfl:    OZEMPIC, 1 MG/DOSE, 4 MG/3ML SOPN, Inject 1 mg into the skin once a week., Disp: , Rfl:    rivaroxaban (XARELTO) 20 MG TABS tablet, Take 1 tablet (20  mg total) by mouth daily with supper. Starting 09/10/2022, Disp: 30 tablet, Rfl: 3   rosuvastatin (CRESTOR) 10 MG tablet, Take 10 mg by mouth at bedtime. (Patient not taking: Reported on 11/09/2022), Disp: , Rfl:    spironolactone (ALDACTONE) 25 MG tablet, Take 1 tablet (25 mg total) by mouth daily. (Patient not taking: Reported on 11/09/2022), Disp: 30 tablet, Rfl: 3   spironolactone (ALDACTONE) 50 MG tablet, Take 1 tablet by mouth daily., Disp: , Rfl:    tapentadol (NUCYNTA) 50 MG tablet, , Disp: , Rfl:    torsemide (DEMADEX) 20 MG tablet, Take 1 tablet (20 mg total) by mouth daily. If increasing weight or edema, take a second 20mg  dose in evening, Disp: 30 tablet, Rfl: 3   torsemide (DEMADEX) 20 MG tablet, Take by mouth. (Patient not taking: Reported on 11/09/2022), Disp: , Rfl:    XARELTO 15 MG TABS tablet, Take 1 tablet (15 mg total) by mouth 2 (two) times daily with a meal for 10 days. Through Jan. 24th.  On Jan 25th, start taking 20 mg once daily with a meal., Disp: 20 tablet, Rfl: 0  Past Medical History: Past Medical History:  Diagnosis Date   Acute respiratory failure with hypoxia ([place]) 08/26/2022   Arthritis    History of kidney stones    Hypertension    Hypokalemia 08/29/2022    Tobacco Use: Social History   Tobacco Use  Smoking Status  Never  Smokeless Tobacco Never    Labs: Review Flowsheet       Latest Ref Rng & Units 12/30/2011 06/18/2014 06/19/2014 08/26/2022  Labs for ITP Cardiac and Pulmonary Rehab  Cholestrol 0 - 200 mg/dL 164  165  140  -  LDL (calc) 0 - 100 mg/dL 102  91  83  -  HDL-C 40 - 60 mg/dL 46  35  29  -  Trlycerides 0 - 200 mg/dL 78  195  140  -  Hemoglobin A1c 4.8 - 5.6 % 5.6  - - 6.9      Pulmonary Assessment Scores:  Pulmonary Assessment Scores     Row Name 11/16/22 1655         ADL UCSD   ADL Phase Entry     SOB Score total 94     Rest 0     Walk 4     Stairs 5     Bath 5     Dress 4     Shop 5       CAT Score   CAT Score 21        mMRC Score   mMRC Score 2              UCSD: Self-administered rating of dyspnea associated with activities of daily living (ADLs) 6-point scale (0 = "not at all" to 5 = "maximal or unable to do because of breathlessness")  Scoring Scores range from 0 to 120.  Minimally important difference is 5 units  CAT: CAT can identify the health impairment of COPD patients and is better correlated with disease progression.  CAT has a scoring range of zero to 40. The CAT score is classified into four groups of low (less than 10), medium (10 - 20), high (21-30) and very high (31-40) based on the impact level of disease on health status. A CAT score over 10 suggests significant symptoms.  A worsening CAT score could be explained by an exacerbation, poor medication adherence, poor inhaler technique, or progression of COPD or comorbid conditions.  CAT MCID is 2 points  mMRC: mMRC (Modified Medical Research Council) Dyspnea Scale is used to assess the degree of baseline functional disability in patients of respiratory disease due to dyspnea. No minimal important difference is established. A decrease in score of 1 point or greater is considered a positive change.   Pulmonary Function Assessment:  Pulmonary Function Assessment - 11/09/22 1030       Breath   Shortness of Breath Yes;Limiting activity             Exercise Target Goals: Exercise Program Goal: Individual exercise prescription set using results from initial 6 min walk test and THRR while considering  patient's activity barriers and safety.   Exercise Prescription Goal: Initial exercise prescription builds to 30-45 minutes a day of aerobic activity, 2-3 days per week.  Home exercise guidelines will be given to patient during program as part of exercise prescription that the participant will acknowledge.  Education: Aerobic Exercise: - Group verbal and visual presentation on the components of exercise prescription. Introduces F.I.T.T  principle from ACSM for exercise prescriptions.  Reviews F.I.T.T. principles of aerobic exercise including progression. Written material given at graduation.   Education: Resistance Exercise: - Group verbal and visual presentation on the components of exercise prescription. Introduces F.I.T.T principle from ACSM for exercise prescriptions  Reviews F.I.T.T. principles of resistance exercise including progression. Written material given at graduation.    Education: Exercise &  Equipment Safety: - Individual verbal instruction and demonstration of equipment use and safety with use of the equipment. Flowsheet Row Pulmonary Rehab from 11/16/2022 in [facility] Cardiac and Pulmonary Rehab  Date 11/16/22  Educator [facility]  Instruction Review Code 1- Verbalizes Understanding       Education: Exercise Physiology & General Exercise Guidelines: - Group verbal and written instruction with models to review the exercise physiology of the cardiovascular system and associated critical values. Provides general exercise guidelines with specific guidelines to those with heart or lung disease.    Education: Flexibility, Balance, Mind/Body Relaxation: - Group verbal and visual presentation with interactive activity on the components of exercise prescription. Introduces F.I.T.T principle from ACSM for exercise prescriptions. Reviews F.I.T.T. principles of flexibility and balance exercise training including progression. Also discusses the mind body connection.  Reviews various relaxation techniques to help reduce and manage stress (i.e. Deep breathing, progressive muscle relaxation, and visualization). Balance handout provided to take home. Written material given at graduation.   Activity Barriers & Risk Stratification:  Activity Barriers & Cardiac Risk Stratification - 11/16/22 1643       Activity Barriers & Cardiac Risk Stratification   Activity Barriers Other (comment)    Comments knee pain in both knees, carpel tunnel              6 Minute Walk:  6 Minute Walk     Row Name 11/16/22 1640         6 Minute Walk   Phase Initial     Distance 750 feet     Walk Time 4.5 minutes     # of Rest Breaks 2     MPH 1.89     METS 1.36     RPE 15     Perceived Dyspnea  3     VO2 Peak 4.76     Symptoms Yes (comment)     Comments SOB     Resting HR 87 bpm     Resting BP 102/62     Resting Oxygen Saturation  94 %     Exercise Oxygen Saturation  during 6 min walk 93 %     Max Ex. HR 112 bpm     Max Ex. BP 112/60     2 Minute Post BP 104/64       Interval HR   1 Minute HR 104     2 Minute HR 107     3 Minute HR 93  taken during rest break     4 Minute HR 103     5 Minute HR 111     6 Minute HR 112     2 Minute Post HR 95     Interval Heart Rate? Yes       Interval Oxygen   Interval Oxygen? Yes     Baseline Oxygen Saturation % 94 %     1 Minute Oxygen Saturation % 93 %     1 Minute Liters of Oxygen 0 L     2 Minute Oxygen Saturation % 94 %     2 Minute Liters of Oxygen 0 L     3 Minute Oxygen Saturation % 96 %     3 Minute Liters of Oxygen 0 L     4 Minute Oxygen Saturation % 96 %     4 Minute Liters of Oxygen 0 L     5 Minute Oxygen Saturation % 95 %     5 Minute Liters of Oxygen  0 L     6 Minute Oxygen Saturation % 95 %     6 Minute Liters of Oxygen 0 L     2 Minute Post Oxygen Saturation % 97 %     2 Minute Post Liters of Oxygen 0 L             Oxygen Initial Assessment:  Oxygen Initial Assessment - 11/16/22 1658       Home Oxygen   Home Oxygen Device None    Sleep Oxygen Prescription CPAP    Home Exercise Oxygen Prescription None    Home Resting Oxygen Prescription None    Compliance with Home Oxygen Use Yes      Initial 6 min Walk   Oxygen Used None      Program Oxygen Prescription   Program Oxygen Prescription None      Intervention   Short Term Goals To learn and demonstrate proper pursed lip breathing techniques or other breathing techniques. ;To learn and  understand importance of monitoring SPO2 with pulse oximeter and demonstrate accurate use of the pulse oximeter.;To learn and understand importance of maintaining oxygen saturations>88%    Long  Term Goals Maintenance of O2 saturations>88%;Exhibits compliance with exercise, home  and travel O2 prescription;Exhibits proper breathing techniques, such as pursed lip breathing or other method taught during program session;Verbalizes importance of monitoring SPO2 with pulse oximeter and return demonstration             Oxygen Re-Evaluation:   Oxygen Discharge (Final Oxygen Re-Evaluation):   Initial Exercise Prescription:  Initial Exercise Prescription - 11/16/22 1600       Date of Initial Exercise RX and Referring Provider   Date 11/16/22    Referring Provider K. Sapp      Oxygen   Maintain Oxygen Saturation 88% or higher      NuStep   Level 1    SPM 80    Minutes 15    METs 1.36      T5 Nustep   Level 1    SPM 80    Minutes 15    METs 1.36      Biostep-RELP   Level 1    SPM 50    Minutes 15    METs 1.36      Track   Laps 10    Minutes 15    METs 1.54      Prescription Details   Frequency (times per week) 2    Duration Progress to 30 minutes of continuous aerobic without signs/symptoms of physical distress      Intensity   THRR 40-80% of Max Heartrate 117-147    Ratings of Perceived Exertion 11-13    Perceived Dyspnea 0-4      Progression   Progression Continue to progress workloads to maintain intensity without signs/symptoms of physical distress.      Resistance Training   Training Prescription Yes    Weight 4    Reps 10-15             Perform Capillary Blood Glucose checks as needed.  Exercise Prescription Changes:   Exercise Prescription Changes     Row Name 11/16/22 1600 11/25/22 1600           Response to Exercise   Blood Pressure (Admit) 102/62 124/74      Blood Pressure (Exercise) 112/60 136/72      Blood Pressure (Exit) 104/64  142/76      Heart Rate (Admit) 87 bpm 90  bpm      Heart Rate (Exercise) 112 bpm 114 bpm      Heart Rate (Exit) 95 bpm 97 bpm      Oxygen Saturation (Admit) 94 % 96 %      Oxygen Saturation (Exercise) 93 % 91 %      Oxygen Saturation (Exit) 97 % 95 %      Rating of Perceived Exertion (Exercise) 15 15      Perceived Dyspnea (Exercise) 3 3      Symptoms SOB SOB      Comments 6 MWT results First full day of exercise      Duration -- Progress to 30 minutes of  aerobic without signs/symptoms of physical distress      Intensity -- THRR unchanged        Progression   Progression -- Continue to progress workloads to maintain intensity without signs/symptoms of physical distress.      Average METs -- 1.96        Resistance Training   Training Prescription -- Yes      Weight -- 4 lb      Reps -- 10-15        Interval Training   Interval Training -- No        T5 Nustep   Level -- 1      Minutes -- 15      METs -- 2        Track   Laps -- 17      Minutes -- 15      METs -- 1.92        Oxygen   Maintain Oxygen Saturation -- 88% or higher               Exercise Comments:   Exercise Comments     Row Name 11/19/22 1111           Exercise Comments First full day of exercise!  Patient was oriented to gym and equipment including functions, settings, policies, and procedures.  Patient's individual exercise prescription and treatment plan were reviewed.  All starting workloads were established based on the results of the 6 minute walk test done at initial orientation visit.  The plan for exercise progression was also introduced and progression will be customized based on patient's performance and goals.                Exercise Goals and Review:   Exercise Goals     Row Name 11/16/22 1648             Exercise Goals   Increase Physical Activity Yes       Intervention Provide advice, education, support and counseling about physical activity/exercise needs.;Develop an  individualized exercise prescription for aerobic and resistive training based on initial evaluation findings, risk stratification, comorbidities and participant's personal goals.       Expected Outcomes Short Term: Attend rehab on a regular basis to increase amount of physical activity.;Long Term: Add in home exercise to make exercise part of routine and to increase amount of physical activity.;Long Term: Exercising regularly at least 3-5 days a week.       Increase Strength and Stamina Yes       Intervention Provide advice, education, support and counseling about physical activity/exercise needs.;Develop an individualized exercise prescription for aerobic and resistive training based on initial evaluation findings, risk stratification, comorbidities and participant's personal goals.       Expected Outcomes Short Term: Increase workloads from  initial exercise prescription for resistance, speed, and METs.;Short Term: Perform resistance training exercises routinely during rehab and add in resistance training at home;Long Term: Improve cardiorespiratory fitness, muscular endurance and strength as measured by increased METs and functional capacity ( )       Able to understand and use rate of perceived exertion (RPE) scale Yes       Intervention Provide education and explanation on how to use RPE scale       Expected Outcomes Short Term: Able to use RPE daily in rehab to express subjective intensity level;Long Term:  Able to use RPE to guide intensity level when exercising independently       Able to understand and use Dyspnea scale Yes       Intervention Provide education and explanation on how to use Dyspnea scale       Expected Outcomes Short Term: Able to use Dyspnea scale daily in rehab to express subjective sense of shortness of breath during exertion;Long Term: Able to use Dyspnea scale to guide intensity level when exercising independently       Knowledge and understanding of Target Heart Rate Range  (THRR) Yes       Intervention Provide education and explanation of THRR including how the numbers were predicted and where they are located for reference       Expected Outcomes Short Term: Able to state/look up THRR;Long Term: Able to use THRR to govern intensity when exercising independently;Short Term: Able to use daily as guideline for intensity in rehab       Able to check pulse independently Yes       Intervention Provide education and demonstration on how to check pulse in carotid and radial arteries.;Review the importance of being able to check your own pulse for safety during independent exercise       Expected Outcomes Short Term: Able to explain why pulse checking is important during independent exercise       Understanding of Exercise Prescription Yes       Intervention Provide education, explanation, and written materials on patient's individual exercise prescription       Expected Outcomes Short Term: Able to explain program exercise prescription;Long Term: Able to explain home exercise prescription to exercise independently                Exercise Goals Re-Evaluation :  Exercise Goals Re-Evaluation     Row Name 11/19/22 1111 11/25/22 1610           Exercise Goal Re-Evaluation   Exercise Goals Review Able to understand and use rate of perceived exertion (RPE) scale;Able to understand and use Dyspnea scale;Knowledge and understanding of Target Heart Rate Range (THRR);Understanding of Exercise Prescription Understanding of Exercise Prescription;Increase Physical Activity;Increase Strength and Stamina      Comments Reviewed RPE scale, THR and program prescription with pt today.  Pt voiced understanding and was given a copy of goals to take home. Void is off to a good start in the program. He had an average MET level of 1.96 METs during his first session at rehab. He also was able to walk up 17 laps on the track and did well with level 1 on the T5 nustep. His O2 also saturations  did not drop below 91% during his first day. We will continue to monitor his progress in the program.      Expected Outcomes Short: Use RPE daily to regulate intensity. Long: Follow program prescription in THR. Short: Continue to follow current  exercise prescription. Long: Continue to increase overall MET level.               Discharge Exercise Prescription (Final Exercise Prescription Changes):  Exercise Prescription Changes - 11/25/22 1600       Response to Exercise   Blood Pressure (Admit) 124/74    Blood Pressure (Exercise) 136/72    Blood Pressure (Exit) 142/76    Heart Rate (Admit) 90 bpm    Heart Rate (Exercise) 114 bpm    Heart Rate (Exit) 97 bpm    Oxygen Saturation (Admit) 96 %    Oxygen Saturation (Exercise) 91 %    Oxygen Saturation (Exit) 95 %    Rating of Perceived Exertion (Exercise) 15    Perceived Dyspnea (Exercise) 3    Symptoms SOB    Comments First full day of exercise    Duration Progress to 30 minutes of  aerobic without signs/symptoms of physical distress    Intensity THRR unchanged      Progression   Progression Continue to progress workloads to maintain intensity without signs/symptoms of physical distress.    Average METs 1.96      Resistance Training   Training Prescription Yes    Weight 4 lb    Reps 10-15      Interval Training   Interval Training No      T5 Nustep   Level 1    Minutes 15    METs 2      Track   Laps 17    Minutes 15    METs 1.92      Oxygen   Maintain Oxygen Saturation 88% or higher             Nutrition:  Target Goals: Understanding of nutrition guidelines, daily intake of sodium 1500mg , cholesterol 200mg , calories 30% from fat and 7% or less from saturated fats, daily to have 5 or more servings of fruits and vegetables.  Education: All About Nutrition: -Group instruction provided by verbal, written material, interactive activities, discussions, models, and posters to present general guidelines for heart  healthy nutrition including fat, fiber, MyPlate, the role of sodium in heart healthy nutrition, utilization of the nutrition label, and utilization of this knowledge for meal planning. Follow up email sent as well. Written material given at graduation.   Biometrics:  Pre Biometrics - 11/16/22 1649       Pre Biometrics   Height  (1.854 m)    Weight 349 lb 12.8 oz (158.7 kg)    Waist Circumference 57 inches    Hip Circumference 52 inches    Waist to Hip Ratio 1.1 %    BMI (Calculated) 46.16    Single Leg Stand 3.16 seconds              Nutrition Therapy Plan and Nutrition Goals:  Nutrition Therapy & Goals - 11/16/22 1427       Nutrition Therapy   Diet Heart healthy, low Na, T2DM MNT    Drug/Food Interactions Statins/Certain Fruits    Protein (specify units) 150g    Fiber 30 grams    Whole Grain Foods 3 servings    Saturated Fats 16 max. grams    Fruits and Vegetables 8 servings/day    Sodium 1.5 grams      Personal Nutrition Goals   Nutrition Goal ST: pair CHO rich foods with fiber, protein, and heart healthy fat - peanut butter with fruit, greek yogurt with fruit, vegetables and egg in grits, whole grain  english muffin with scrambled eggs, etc. LT: Maintain A1C <7, limit Na <1.5g/day, continue with changes made    Comments 57 y.o. M admitted to pulmonary rehab for covid 19 condition. PMHx includes HTN, chronic diastolic CHF, T2DM, OSA. Medications reviewed jardiance, ozempic, xarelto, crestor, torsemide. Labs reviewed. He is mindful of his sodium and added sugar. He tries to use more fresh foods and he limits eating out. He reports making changes to his diet in 2022 - especially limiting sodium. he reports his MD increased his dose of ozempic about 3 weeks ago and he is doing well on it. B: grits or piece of fruit (ornage or banana) or scrambled egg in microwave. L: fruit or half sandwich or leftovers D: pork chop, meat with 2 vegetables. He aims to have grilled foods. His  mother in law lives with them. Drinks: water, occassionally diet soda or diet tea. He uses olive oil, he does not use butter. Discussed heart healthy eating and T2DM MNT.      Intervention Plan   Intervention Prescribe, educate and counsel regarding individualized specific dietary modifications aiming towards targeted core components such as weight, hypertension, lipid management, diabetes, heart failure and other comorbidities.;Nutrition handout(s) given to patient.    Expected Outcomes Short Term Goal: Understand basic principles of dietary content, such as calories, fat, sodium, cholesterol and nutrients.;Short Term Goal: A plan has been developed with personal nutrition goals set during dietitian appointment.;Long Term Goal: Adherence to prescribed nutrition plan.             Nutrition Assessments:  MEDIFICTS Score Key: ?70 Need to make dietary changes  40-70 Heart Healthy Diet ? 40 Therapeutic Level Cholesterol Diet  Flowsheet Row Pulmonary Rehab from 11/16/2022 in [facility] Cardiac and Pulmonary Rehab  Picture Your Plate Total Score on Admission 63      Picture Your Plate Scores: <09 Unhealthy dietary pattern with much room for improvement. 41-50 Dietary pattern unlikely to meet recommendations for good health and room for improvement. 51-60 More healthful dietary pattern, with some room for improvement.  >60 Healthy dietary pattern, although there may be some specific behaviors that could be improved.   Nutrition Goals Re-Evaluation:   Nutrition Goals Discharge (Final Nutrition Goals Re-Evaluation):   Psychosocial: Target Goals: Acknowledge presence or absence of significant depression and/or stress, maximize coping skills, provide positive support system. Participant is able to verbalize types and ability to use techniques and skills needed for reducing stress and depression.   Education: Stress, Anxiety, and Depression - Group verbal and visual presentation to define topics  covered.  Reviews how body is impacted by stress, anxiety, and depression.  Also discusses healthy ways to reduce stress and to treat/manage anxiety and depression.  Written material given at graduation.   Education: Sleep Hygiene -Provides group verbal and written instruction about how sleep can affect your health.  Define sleep hygiene, discuss sleep cycles and impact of sleep habits. Review good sleep hygiene tips.    Initial Review & Psychosocial Screening:  Initial Psych Review & Screening - 11/09/22 1034       Initial Review   Current issues with Current Stress Concerns    Source of Stress Concerns Chronic Illness    Comments Hector Hancock states that his health has been stressing him out. He had blood clots and has been having alot of bouts with shortness of breath. He has the stress of medical bills and not working.      Family Dynamics   Good Support System? Yes  Comments He can look to his wife, son, parents and four sisters for support.      Barriers   Psychosocial barriers to participate in program The patient should benefit from training in stress management and relaxation.      Screening Interventions   Interventions Encouraged to exercise;To provide support and resources with identified psychosocial needs;Provide feedback about the scores to participant    Expected Outcomes Short Term goal: Utilizing psychosocial counselor, staff and physician to assist with identification of specific Stressors or current issues interfering with healing process. Setting desired goal for each stressor or current issue identified.;Long Term Goal: Stressors or current issues are controlled or eliminated.;Short Term goal: Identification and review with participant of any Quality of Life or Depression concerns found by scoring the questionnaire.;Long Term goal: The participant improves quality of Life and PHQ9 Scores as seen by post scores and/or verbalization of changes             Quality of  Life Scores:  Scores of 19 and below usually indicate a poorer quality of life in these areas.  A difference of  2-3 points is a clinically meaningful difference.  A difference of 2-3 points in the total score of the Quality of Life Index has been associated with significant improvement in overall quality of life, self-image, physical symptoms, and general health in studies assessing change in quality of life.  PHQ-9: Review Flowsheet       11/16/2022 04/24/2021  Depression screen PHQ 2/9  Decreased Interest 0 0  Down, Depressed, Hopeless 0 0  PHQ - 2 Score 0 0  Altered sleeping 3 -  Tired, decreased energy 1 -  Change in appetite 1 -  Feeling bad or failure about yourself  3 -  Trouble concentrating 1 -  Moving slowly or fidgety/restless 0 -  Suicidal thoughts 0 -  PHQ-9 Score 9 -  Difficult doing work/chores Extremely dIfficult -   Interpretation of Total Score  Total Score Depression Severity:  1-4 = Minimal depression, 5-9 = Mild depression, 10-14 = Moderate depression, 15-19 = Moderately severe depression, 20-27 = Severe depression   Psychosocial Evaluation and Intervention:  Psychosocial Evaluation - 11/09/22 1036       Psychosocial Evaluation & Interventions   Interventions Encouraged to exercise with the program and follow exercise prescription;Relaxation education;Stress management education    Comments Hector Hancock states that his health has been stressing him out. He had blood clots and has been having alot of bouts with shortness of breath. He has the stress of medical bills and not working.He can look to his wife, son, parents and four sisters for support.    Expected Outcomes Short: Start [facility] to help with mood. Long: Maintain a healthy mental state.    Continue Psychosocial Services  Follow up required by staff             Psychosocial Re-Evaluation:   Psychosocial Discharge (Final Psychosocial Re-Evaluation):   Education: Education Goals: Education classes  will be provided on a weekly basis, covering required topics. Participant will state understanding/return demonstration of topics presented.  Learning Barriers/Preferences:  Learning Barriers/Preferences - 11/09/22 1031       Learning Barriers/Preferences   Learning Barriers None    Learning Preferences None             General Pulmonary Education Topics:  Infection Prevention: - Provides verbal and written material to individual with discussion of infection control including proper hand washing and proper equipment cleaning during exercise  session. Flowsheet Row Pulmonary Rehab from 11/16/2022 in [facility] Cardiac and Pulmonary Rehab  Date 11/16/22  Educator [facility]  Instruction Review Code 1- Verbalizes Understanding       Falls Prevention: - Provides verbal and written material to individual with discussion of falls prevention and safety. Flowsheet Row Pulmonary Rehab from 11/16/2022 in [facility] Cardiac and Pulmonary Rehab  Date 11/16/22  Educator [facility]  Instruction Review Code 1- Verbalizes Understanding       Chronic Lung Disease Review: - Group verbal instruction with posters, models, PowerPoint presentations and videos,  to review new updates, new respiratory medications, new advancements in procedures and treatments. Providing information on websites and "800" numbers for continued self-education. Includes information about supplement oxygen, available portable oxygen systems, continuous and intermittent flow rates, oxygen safety, concentrators, and Medicare reimbursement for oxygen. Explanation of Pulmonary Drugs, including class, frequency, complications, importance of spacers, rinsing mouth after steroid MDI's, and proper cleaning methods for nebulizers. Review of basic lung anatomy and physiology related to function, structure, and complications of lung disease. Review of risk factors. Discussion about methods for diagnosing sleep apnea and types of masks and machines for OSA. Includes  a review of the use of types of environmental controls: home humidity, furnaces, filters, dust mite/pet prevention, HEPA vacuums. Discussion about weather changes, air quality and the benefits of nasal washing. Instruction on Warning signs, infection symptoms, calling MD promptly, preventive modes, and value of vaccinations. Review of effective airway clearance, coughing and/or vibration techniques. Emphasizing that all should Create an Action Plan. Written material given at graduation. Flowsheet Row Pulmonary Rehab from 11/16/2022 in [facility] Cardiac and Pulmonary Rehab  Education need identified 11/16/22       AED/CPR: - Group verbal and written instruction with the use of models to demonstrate the basic use of the AED with the basic ABC's of resuscitation.    Anatomy and Cardiac Procedures: - Group verbal and visual presentation and models provide information about basic cardiac anatomy and function. Reviews the testing methods done to diagnose heart disease and the outcomes of the test results. Describes the treatment choices: Medical Management, Angioplasty, or Coronary Bypass Surgery for treating various heart conditions including Myocardial Infarction, Angina, Valve Disease, and Cardiac Arrhythmias.  Written material given at graduation.   Medication Safety: - Group verbal and visual instruction to review commonly prescribed medications for heart and lung disease. Reviews the medication, class of the drug, and side effects. Includes the steps to properly store meds and maintain the prescription regimen.  Written material given at graduation.   Other: -Provides group and verbal instruction on various topics (see comments)   Knowledge Questionnaire Score:  Knowledge Questionnaire Score - 11/16/22 1650       Knowledge Questionnaire Score   Pre Score 15/18              Core Components/Risk Factors/Patient Goals at Admission:  Personal Goals and Risk Factors at Admission - 11/16/22  1652       Core Components/Risk Factors/Patient Goals on Admission    Weight Management Yes;Weight Loss    Intervention Weight Management: Develop a combined nutrition and exercise program designed to reach desired caloric intake, while maintaining appropriate intake of nutrient and fiber, sodium and fats, and appropriate energy expenditure required for the weight goal.;Weight Management: Provide education and appropriate resources to help participant work on and attain dietary goals.;Weight Management/Obesity: Establish reasonable short term and long term weight goals.;Obesity: Provide education and appropriate resources to help participant work on and attain  dietary goals.    Admit Weight 349 lb 12.8 oz (158.7 kg)    Goal Weight: Short Term 340 lb (154.2 kg)    Goal Weight: Long Term 300 lb (136.1 kg)    Expected Outcomes Short Term: Continue to assess and modify interventions until short term weight is achieved;Long Term: Adherence to nutrition and physical activity/exercise program aimed toward attainment of established weight goal;Weight Loss: Understanding of general recommendations for a balanced deficit meal plan, which promotes 1-2 lb weight loss per week and includes a negative energy balance of [phone] kcal/d;Understanding recommendations for meals to include 15-35% energy as protein, 25-35% energy from fat, 35-60% energy from carbohydrates, less than 200mg  of dietary cholesterol, 20-35 gm of total fiber daily;Understanding of distribution of calorie intake throughout the day with the consumption of 4-5 meals/snacks    Improve shortness of breath with ADL's Yes    Intervention Provide education, individualized exercise plan and daily activity instruction to help decrease symptoms of SOB with activities of daily living.    Expected Outcomes Short Term: Improve cardiorespiratory fitness to achieve a reduction of symptoms when performing ADLs;Long Term: Be able to perform more ADLs without  symptoms or delay the onset of symptoms    Diabetes Yes    Intervention Provide education about signs/symptoms and action to take for hypo/hyperglycemia.;Provide education about proper nutrition, including hydration, and aerobic/resistive exercise prescription along with prescribed medications to achieve blood glucose in normal ranges: Fasting glucose 65-99 mg/dL    Expected Outcomes Long Term: Attainment of HbA1C < 7%.;Short Term: Participant verbalizes understanding of the signs/symptoms and immediate care of hyper/hypoglycemia, proper foot care and importance of medication, aerobic/resistive exercise and nutrition plan for blood glucose control.    Heart Failure Yes    Intervention Provide a combined exercise and nutrition program that is supplemented with education, support and counseling about heart failure. Directed toward relieving symptoms such as shortness of breath, decreased exercise tolerance, and extremity edema.    Expected Outcomes Improve functional capacity of life;Short term: Attendance in program 2-3 days a week with increased exercise capacity. Reported lower sodium intake. Reported increased fruit and vegetable intake. Reports medication compliance.;Short term: Daily weights obtained and reported for increase. Utilizing diuretic protocols set by physician.;Long term: Adoption of self-care skills and reduction of barriers for early signs and symptoms recognition and intervention leading to self-care maintenance.    Hypertension Yes    Intervention Provide education on lifestyle modifcations including regular physical activity/exercise, weight management, moderate sodium restriction and increased consumption of fresh fruit, vegetables, and low fat dairy, alcohol moderation, and smoking cessation.;Monitor prescription use compliance.    Expected Outcomes Short Term: Continued assessment and intervention until BP is < 140/9mm HG in hypertensive participants. < 130/63mm HG in hypertensive  participants with diabetes, heart failure or chronic kidney disease.;Long Term: Maintenance of blood pressure at goal levels.    Lipids Yes    Intervention Provide education and support for participant on nutrition & aerobic/resistive exercise along with prescribed medications to achieve LDL 70mg , HDL >40mg .    Expected Outcomes Short Term: Participant states understanding of desired cholesterol values and is compliant with medications prescribed. Participant is following exercise prescription and nutrition guidelines.;Long Term: Cholesterol controlled with medications as prescribed, with individualized exercise RX and with personalized nutrition plan. Value goals: LDL < 70mg , HDL > 40 mg.             Education:Diabetes - Individual verbal and written instruction to review signs/symptoms of diabetes, desired ranges  of glucose level fasting, after meals and with exercise. Acknowledge that pre and post exercise glucose checks will be done for 3 sessions at entry of program. Flowsheet Row Pulmonary Rehab from 11/16/2022 in [facility] Cardiac and Pulmonary Rehab  Date 11/16/22  Educator [facility]  Instruction Review Code 1- Verbalizes Understanding       Know Your Numbers and Heart Failure: - Group verbal and visual instruction to discuss disease risk factors for cardiac and pulmonary disease and treatment options.  Reviews associated critical values for Overweight/Obesity, Hypertension, Cholesterol, and Diabetes.  Discusses basics of heart failure: signs/symptoms and treatments.  Introduces Heart Failure Zone chart for action plan for heart failure.  Written material given at graduation.   Core Components/Risk Factors/Patient Goals Review:    Core Components/Risk Factors/Patient Goals at Discharge (Final Review):    ITP Comments:  ITP Comments     Row Name 11/09/22 1028 11/16/22 1639 11/19/22 1111 12/02/22 0900     ITP Comments Virtual Visit completed. Patient informed on EP and RD appointment and  6 Minute walk test. Patient also informed of patient health questionnaires on My Chart. Patient Verbalizes understanding. Visit diagnosis can be found in [facility] 10/19/[facility]22. Completed and gym orientation. Initial ITP created and sent for review to Dr. Jinny Sanders, Medical Director. First full day of exercise!  Patient was oriented to gym and equipment including functions, settings, policies, and procedures.  Patient's individual exercise prescription and treatment plan were reviewed.  All starting workloads were established based on the results of the 6 minute walk test done at initial orientation visit.  The plan for exercise progression was also introduced and progression will be customized based on patient's performance and goals. 30 day review completed. ITP sent to Dr. Jinny Sanders, Medical Director of  Pulmonary Rehab. Continue with ITP unless changes are made by physician.             Comments: 30 day review

## 2022-12-02 NOTE — Progress Notes (Signed)
Daily Session Note  Patient Details  Name: Hector Hancock MRN: [id number] Date of Birth: 11/23/1965 Referring Provider:   Flowsheet Row Pulmonary Rehab from 11/16/2022 in [facility] Cardiac and Pulmonary Rehab  Referring Provider K. Sapp       Encounter Date: 12/02/2022  Check In:  Session Check In - 12/02/22 1604       Check-In   Supervising physician immediately available to respond to emergencies See telemetry face sheet for immediately available ER MD    Location [facility]-Cardiac & Pulmonary Rehab    Staff Present Susann Givens, RN BSN;Joseph Woodland, RCP,RRT,BSRT;Kelly Rexford, [place], ACSM CEP, Exercise Physiologist    Virtual Visit No    Medication changes reported     No    Fall or balance concerns reported    No    Warm-up and Cool-down Performed on first and last piece of equipment    Resistance Training Performed Yes    VAD Patient? No    PAD/SET Patient? No      Pain Assessment   Currently in Pain? No/denies                Social History   Tobacco Use  Smoking Status Never  Smokeless Tobacco Never    Goals Met:  Independence with exercise equipment Exercise tolerated well No report of concerns or symptoms today Strength training completed today  Goals Unmet:  Not Applicable  Comments: Pt able to follow exercise prescription today without complaint.  Will continue to monitor for progression.    Dr. Bethann Punches is Medical Director for [facility] Cardiac Rehabilitation.  Dr. Vida Rigger is Medical Director for [facility] Pulmonary Rehabilitation.

## 2022-12-03 ENCOUNTER — Ambulatory Visit: Payer: BC Managed Care – PPO

## 2022-12-08 ENCOUNTER — Ambulatory Visit: Payer: BC Managed Care – PPO

## 2022-12-09 ENCOUNTER — Telehealth: Payer: Self-pay

## 2022-12-09 NOTE — Telephone Encounter (Signed)
Patient called and asked to be placed on a medical hold with pulmonary rehab as he has been experiencing BP and BG issues. He has already spoke to his doctor about it and are in the middle of changing some medications. Patient would like to also wait until after his MRI in the next couple weeks to see what his results are regarding his syncopal episodes. Will place patient on medical hold at this time.

## 2022-12-10 ENCOUNTER — Ambulatory Visit: Payer: BC Managed Care – PPO

## 2022-12-15 ENCOUNTER — Ambulatory Visit: Payer: BC Managed Care – PPO

## 2022-12-17 ENCOUNTER — Ambulatory Visit: Payer: BC Managed Care – PPO

## 2022-12-22 ENCOUNTER — Ambulatory Visit: Payer: BC Managed Care – PPO

## 2022-12-24 ENCOUNTER — Ambulatory Visit: Payer: BC Managed Care – PPO

## 2022-12-29 ENCOUNTER — Ambulatory Visit: Payer: BC Managed Care – PPO

## 2022-12-30 ENCOUNTER — Encounter: Payer: Self-pay | Admitting: *Deleted

## 2022-12-30 DIAGNOSIS — R0609 Other forms of dyspnea: Secondary | ICD-10-CM

## 2022-12-30 DIAGNOSIS — U099 Post covid-19 condition, unspecified: Secondary | ICD-10-CM

## 2022-12-30 NOTE — Progress Notes (Signed)
Pulmonary Individual Treatment Plan  Patient Details  Name: Hector Hancock MRN: [id number] Date of Birth: 1965/11/12 Referring Provider:   Flowsheet Row Pulmonary Rehab from 11/16/2022 in [facility] Cardiac and Pulmonary Rehab  Referring Provider K. 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 blood glucose meter kit preferred by patient's insurance. Dx: Diabetes, E11.9, Disp: , Rfl:    butalbital-acetaminophen-caffeine (FIORICET) 50-325-40 MG tablet, Take 1-2 tablets by mouth every 6 (six) hours as needed for headache., Disp: 14 tablet, Rfl: 0   diphenhydrAMINE (BENADRYL) 25 mg capsule, Take 50 mg by mouth at bedtime., Disp: , Rfl:    empagliflozin (JARDIANCE) 10 MG TABS tablet, Take by mouth., Disp: , Rfl:    enalapril (VASOTEC) 5 MG tablet, TAKE 1 TABLET (5 MG TOTAL) BY MOUTH DAILY., Disp: 90 tablet, Rfl: 2   enalapril (VASOTEC) 5 MG tablet, Take by mouth. (Patient not taking: Reported on 11/09/2022), Disp: , Rfl:    ENTRESTO 24-26 MG, Take 0.5 tablets by mouth 2 (two) times daily., Disp: , Rfl:    hydroxychloroquine (PLAQUENIL) 200 MG tablet, Take 200 mg by mouth 2 (two) times daily., Disp: , Rfl:    hydroxychloroquine (PLAQUENIL) 200 MG tablet, Take by mouth. (Patient not taking: Reported on 11/09/2022), Disp: , Rfl:    JARDIANCE 25 MG TABS tablet, Take 25 mg by mouth every morning., Disp: , Rfl:    OZEMPIC, 0.25 OR 0.5 MG/DOSE, 2 MG/3ML SOPN, Inject 0.5 mg into the skin once a week. sunday (Patient not taking: Reported on 11/09/2022), Disp: , Rfl:    OZEMPIC, 1 MG/DOSE, 4 MG/3ML SOPN, Inject 1 mg into the skin once a week., Disp: , Rfl:    rivaroxaban (XARELTO) 20 MG TABS tablet, Take 1 tablet (20  mg total) by mouth daily with supper. Starting 09/10/2022, Disp: 30 tablet, Rfl: 3   rosuvastatin (CRESTOR) 10 MG tablet, Take 10 mg by mouth at bedtime. (Patient not taking: Reported on 11/09/2022), Disp: , Rfl:    spironolactone (ALDACTONE) 25 MG tablet, Take 1 tablet (25 mg total) by mouth daily. (Patient not taking: Reported on 11/09/2022), Disp: 30 tablet, Rfl: 3   spironolactone (ALDACTONE) 50 MG tablet, Take 1 tablet by mouth daily., Disp: , Rfl:    tapentadol (NUCYNTA) 50 MG tablet, , Disp: , Rfl:    torsemide (DEMADEX) 20 MG tablet, Take 1 tablet (20 mg total) by mouth daily. If increasing weight or edema, take a second 20mg  dose in evening, Disp: 30 tablet, Rfl: 3   torsemide (DEMADEX) 20 MG tablet, Take by mouth. (Patient not taking: Reported on 11/09/2022), Disp: , Rfl:    XARELTO 15 MG TABS tablet, Take 1 tablet (15 mg total) by mouth 2 (two) times daily with a meal for 10 days. Through Jan. 24th.  On Jan 25th, start taking 20 mg once daily with a meal., Disp: 20 tablet, Rfl: 0  Past Medical History: Past Medical History:  Diagnosis Date   Acute respiratory failure with hypoxia ([place]) 08/26/2022   Arthritis    History of kidney stones    Hypertension    Hypokalemia 08/29/2022    Tobacco Use: Social History   Tobacco Use  Smoking Status  Never  Smokeless Tobacco Never    Labs: Review Flowsheet       Latest Ref Rng & Units 12/30/2011 06/18/2014 06/19/2014 08/26/2022  Labs for ITP Cardiac and Pulmonary Rehab  Cholestrol 0 - 200 mg/dL 164  165  140  -  LDL (calc) 0 - 100 mg/dL 102  91  83  -  HDL-C 40 - 60 mg/dL 46  35  29  -  Trlycerides 0 - 200 mg/dL 78  195  140  -  Hemoglobin A1c 4.8 - 5.6 % 5.6  - - 6.9      Pulmonary Assessment Scores:  Pulmonary Assessment Scores     Row Name 11/16/22 1655         ADL UCSD   ADL Phase Entry     SOB Score total 94     Rest 0     Walk 4     Stairs 5     Bath 5     Dress 4     Shop 5       CAT Score   CAT Score 21        mMRC Score   mMRC Score 2              UCSD: Self-administered rating of dyspnea associated with activities of daily living (ADLs) 6-point scale (0 = "not at all" to 5 = "maximal or unable to do because of breathlessness")  Scoring Scores range from 0 to 120.  Minimally important difference is 5 units  CAT: CAT can identify the health impairment of COPD patients and is better correlated with disease progression.  CAT has a scoring range of zero to 40. The CAT score is classified into four groups of low (less than 10), medium (10 - 20), high (21-30) and very high (31-40) based on the impact level of disease on health status. A CAT score over 10 suggests significant symptoms.  A worsening CAT score could be explained by an exacerbation, poor medication adherence, poor inhaler technique, or progression of COPD or comorbid conditions.  CAT MCID is 2 points  mMRC: mMRC (Modified Medical Research Council) Dyspnea Scale is used to assess the degree of baseline functional disability in patients of respiratory disease due to dyspnea. No minimal important difference is established. A decrease in score of 1 point or greater is considered a positive change.   Pulmonary Function Assessment:  Pulmonary Function Assessment - 11/09/22 1030       Breath   Shortness of Breath Yes;Limiting activity             Exercise Target Goals: Exercise Program Goal: Individual exercise prescription set using results from initial 6 min walk test and THRR while considering  patient's activity barriers and safety.   Exercise Prescription Goal: Initial exercise prescription builds to 30-45 minutes a day of aerobic activity, 2-3 days per week.  Home exercise guidelines will be given to patient during program as part of exercise prescription that the participant will acknowledge.  Education: Aerobic Exercise: - Group verbal and visual presentation on the components of exercise prescription. Introduces F.I.T.T  principle from ACSM for exercise prescriptions.  Reviews F.I.T.T. principles of aerobic exercise including progression. Written material given at graduation.   Education: Resistance Exercise: - Group verbal and visual presentation on the components of exercise prescription. Introduces F.I.T.T principle from ACSM for exercise prescriptions  Reviews F.I.T.T. principles of resistance exercise including progression. Written material given at graduation.    Education: Exercise &  Equipment Safety: - Individual verbal instruction and demonstration of equipment use and safety with use of the equipment. Flowsheet Row Pulmonary Rehab from 11/16/2022 in [facility] Cardiac and Pulmonary Rehab  Date 11/16/22  Educator [facility]  Instruction Review Code 1- Verbalizes Understanding       Education: Exercise Physiology & General Exercise Guidelines: - Group verbal and written instruction with models to review the exercise physiology of the cardiovascular system and associated critical values. Provides general exercise guidelines with specific guidelines to those with heart or lung disease.    Education: Flexibility, Balance, Mind/Body Relaxation: - Group verbal and visual presentation with interactive activity on the components of exercise prescription. Introduces F.I.T.T principle from ACSM for exercise prescriptions. Reviews F.I.T.T. principles of flexibility and balance exercise training including progression. Also discusses the mind body connection.  Reviews various relaxation techniques to help reduce and manage stress (i.e. Deep breathing, progressive muscle relaxation, and visualization). Balance handout provided to take home. Written material given at graduation.   Activity Barriers & Risk Stratification:  Activity Barriers & Cardiac Risk Stratification - 11/16/22 1643       Activity Barriers & Cardiac Risk Stratification   Activity Barriers Other (comment)    Comments knee pain in both knees, carpel tunnel              6 Minute Walk:  6 Minute Walk     Row Name 11/16/22 1640         6 Minute Walk   Phase Initial     Distance 750 feet     Walk Time 4.5 minutes     # of Rest Breaks 2     MPH 1.89     METS 1.36     RPE 15     Perceived Dyspnea  3     VO2 Peak 4.76     Symptoms Yes (comment)     Comments SOB     Resting HR 87 bpm     Resting BP 102/62     Resting Oxygen Saturation  94 %     Exercise Oxygen Saturation  during 6 min walk 93 %     Max Ex. HR 112 bpm     Max Ex. BP 112/60     2 Minute Post BP 104/64       Interval HR   1 Minute HR 104     2 Minute HR 107     3 Minute HR 93  taken during rest break     4 Minute HR 103     5 Minute HR 111     6 Minute HR 112     2 Minute Post HR 95     Interval Heart Rate? Yes       Interval Oxygen   Interval Oxygen? Yes     Baseline Oxygen Saturation % 94 %     1 Minute Oxygen Saturation % 93 %     1 Minute Liters of Oxygen 0 L     2 Minute Oxygen Saturation % 94 %     2 Minute Liters of Oxygen 0 L     3 Minute Oxygen Saturation % 96 %     3 Minute Liters of Oxygen 0 L     4 Minute Oxygen Saturation % 96 %     4 Minute Liters of Oxygen 0 L     5 Minute Oxygen Saturation % 95 %     5 Minute Liters of Oxygen  0 L     6 Minute Oxygen Saturation % 95 %     6 Minute Liters of Oxygen 0 L     2 Minute Post Oxygen Saturation % 97 %     2 Minute Post Liters of Oxygen 0 L             Oxygen Initial Assessment:  Oxygen Initial Assessment - 11/16/22 1658       Home Oxygen   Home Oxygen Device None    Sleep Oxygen Prescription CPAP    Home Exercise Oxygen Prescription None    Home Resting Oxygen Prescription None    Compliance with Home Oxygen Use Yes      Initial 6 min Walk   Oxygen Used None      Program Oxygen Prescription   Program Oxygen Prescription None      Intervention   Short Term Goals To learn and demonstrate proper pursed lip breathing techniques or other breathing techniques. ;To learn and  understand importance of monitoring SPO2 with pulse oximeter and demonstrate accurate use of the pulse oximeter.;To learn and understand importance of maintaining oxygen saturations>88%    Long  Term Goals Maintenance of O2 saturations>88%;Exhibits compliance with exercise, home  and travel O2 prescription;Exhibits proper breathing techniques, such as pursed lip breathing or other method taught during program session;Verbalizes importance of monitoring SPO2 with pulse oximeter and return demonstration             Oxygen Re-Evaluation:   Oxygen Discharge (Final Oxygen Re-Evaluation):   Initial Exercise Prescription:  Initial Exercise Prescription - 11/16/22 1600       Date of Initial Exercise RX and Referring Provider   Date 11/16/22    Referring Provider K. Sapp      Oxygen   Maintain Oxygen Saturation 88% or higher      NuStep   Level 1    SPM 80    Minutes 15    METs 1.36      T5 Nustep   Level 1    SPM 80    Minutes 15    METs 1.36      Biostep-RELP   Level 1    SPM 50    Minutes 15    METs 1.36      Track   Laps 10    Minutes 15    METs 1.54      Prescription Details   Frequency (times per week) 2    Duration Progress to 30 minutes of continuous aerobic without signs/symptoms of physical distress      Intensity   THRR 40-80% of Max Heartrate 117-147    Ratings of Perceived Exertion 11-13    Perceived Dyspnea 0-4      Progression   Progression Continue to progress workloads to maintain intensity without signs/symptoms of physical distress.      Resistance Training   Training Prescription Yes    Weight 4    Reps 10-15             Perform Capillary Blood Glucose checks as needed.  Exercise Prescription Changes:   Exercise Prescription Changes     Row Name 11/16/22 1600 11/25/22 1600 12/07/22 1300         Response to Exercise   Blood Pressure (Admit) 102/62 124/74 152/76     Blood Pressure (Exercise) 112/60 136/72 128/72     Blood  Pressure (Exit) 104/64 142/76 102/74     Heart Rate (Admit) 87 bpm 90  bpm 85 bpm     Heart Rate (Exercise) 112 bpm 114 bpm 109 bpm     Heart Rate (Exit) 95 bpm 97 bpm 100 bpm     Oxygen Saturation (Admit) 94 % 96 % 96 %     Oxygen Saturation (Exercise) 93 % 91 % 95 %     Oxygen Saturation (Exit) 97 % 95 % 97 %     Rating of Perceived Exertion (Exercise) 15 15 15      Perceived Dyspnea (Exercise) 3 3 3      Symptoms SOB SOB lightheaded, SOB     Comments 6 MWT results First full day of exercise --     Duration -- Progress to 30 minutes of  aerobic without signs/symptoms of physical distress Progress to 30 minutes of  aerobic without signs/symptoms of physical distress     Intensity -- THRR unchanged THRR unchanged       Progression   Progression -- Continue to progress workloads to maintain intensity without signs/symptoms of physical distress. Continue to progress workloads to maintain intensity without signs/symptoms of physical distress.     Average METs -- 1.96 2.2       Resistance Training   Training Prescription -- Yes Yes     Weight -- 4 lb 4 lb     Reps -- 10-15 10-15       Interval Training   Interval Training -- No No       Recumbant Bike   Level -- -- 1     Watts -- -- 25     Minutes -- -- 15     METs -- -- 2.49       REL-XR   Level -- -- 1     Minutes -- -- 15       T5 Nustep   Level -- 1 1     Minutes -- 15 15     METs -- 2 2       Track   Laps -- 17 --     Minutes -- 15 --     METs -- 1.92 --       Oxygen   Maintain Oxygen Saturation -- 88% or higher 88% or higher              Exercise Comments:   Exercise Comments     Row Name 11/19/22 1111           Exercise Comments First full day of exercise!  Patient was oriented to gym and equipment including functions, settings, policies, and procedures.  Patient's individual exercise prescription and treatment plan were reviewed.  All starting workloads were established based on the results of the 6  minute walk test done at initial orientation visit.  The plan for exercise progression was also introduced and progression will be customized based on patient's performance and goals.                Exercise Goals and Review:   Exercise Goals     Row Name 11/16/22 1648             Exercise Goals   Increase Physical Activity Yes       Intervention Provide advice, education, support and counseling about physical activity/exercise needs.;Develop an individualized exercise prescription for aerobic and resistive training based on initial evaluation findings, risk stratification, comorbidities and participant's personal goals.       Expected Outcomes Short Term: Attend rehab on a regular basis to increase amount  of physical activity.;Long Term: Add in home exercise to make exercise part of routine and to increase amount of physical activity.;Long Term: Exercising regularly at least 3-5 days a week.       Increase Strength and Stamina Yes       Intervention Provide advice, education, support and counseling about physical activity/exercise needs.;Develop an individualized exercise prescription for aerobic and resistive training based on initial evaluation findings, risk stratification, comorbidities and participant's personal goals.       Expected Outcomes Short Term: Increase workloads from initial exercise prescription for resistance, speed, and METs.;Short Term: Perform resistance training exercises routinely during rehab and add in resistance training at home;Long Term: Improve cardiorespiratory fitness, muscular endurance and strength as measured by increased METs and functional capacity ( )       Able to understand and use rate of perceived exertion (RPE) scale Yes       Intervention Provide education and explanation on how to use RPE scale       Expected Outcomes Short Term: Able to use RPE daily in rehab to express subjective intensity level;Long Term:  Able to use RPE to guide  intensity level when exercising independently       Able to understand and use Dyspnea scale Yes       Intervention Provide education and explanation on how to use Dyspnea scale       Expected Outcomes Short Term: Able to use Dyspnea scale daily in rehab to express subjective sense of shortness of breath during exertion;Long Term: Able to use Dyspnea scale to guide intensity level when exercising independently       Knowledge and understanding of Target Heart Rate Range (THRR) Yes       Intervention Provide education and explanation of THRR including how the numbers were predicted and where they are located for reference       Expected Outcomes Short Term: Able to state/look up THRR;Long Term: Able to use THRR to govern intensity when exercising independently;Short Term: Able to use daily as guideline for intensity in rehab       Able to check pulse independently Yes       Intervention Provide education and demonstration on how to check pulse in carotid and radial arteries.;Review the importance of being able to check your own pulse for safety during independent exercise       Expected Outcomes Short Term: Able to explain why pulse checking is important during independent exercise       Understanding of Exercise Prescription Yes       Intervention Provide education, explanation, and written materials on patient's individual exercise prescription       Expected Outcomes Short Term: Able to explain program exercise prescription;Long Term: Able to explain home exercise prescription to exercise independently                Exercise Goals Re-Evaluation :  Exercise Goals Re-Evaluation     Row Name 11/19/22 1111 11/25/22 1610 12/07/22 1331 12/21/22 1351       Exercise Goal Re-Evaluation   Exercise Goals Review Able to understand and use rate of perceived exertion (RPE) scale;Able to understand and use Dyspnea scale;Knowledge and understanding of Target Heart Rate Range (THRR);Understanding of  Exercise Prescription Understanding of Exercise Prescription;Increase Physical Activity;Increase Strength and Stamina Understanding of Exercise Prescription;Increase Physical Activity;Increase Strength and Stamina Understanding of Exercise Prescription;Increase Physical Activity;Increase Strength and Stamina    Comments Reviewed RPE scale, THR and program prescription with pt today.  Pt voiced understanding and was given a copy of goals to take home. Patience is off to a good start in the program. He had an average MET level of 1.96 METs during his first session at rehab. He also was able to walk up 17 laps on the track and did well with level 1 on the T5 nustep. His O2 also saturations did not drop below 91% during his first day. We will continue to monitor his progress in the program. Hector Hancock had only attended rehab a couple times since last review as he has been having some BP and BS issues. He just saw his doctor on 4/19 and changed some medications in hoping that will help resolve. He will call his doctor again today to follow up. At rehab, he reported some lightheadedness, BP were stable at that time. We will continue to monitor patient as long as he is appropriate to continue his exercise at rehab or if MD says otherwise. Patient has been placed on medical hold at this time per his request. He has been experiencing BP and BG issues. He has already spoke to his doctor about it and are in the middle of changing some medications. Patient would like to also wait until after his MRI in the next couple weeks to see what his results are regarding his syncopal episodes. We will continue to monitor his progress in the program when he returns to rehab.    Expected Outcomes Short: Use RPE daily to regulate intensity. Long: Follow program prescription in THR. Short: Continue to follow current exercise prescription. Long: Continue to increase overall MET level. Short: Return to rehab when appropriate Long: Build up overall  strength and stamina Short: Return to rehab when appropriate. Long: Continue to improve strength and stamina.             Discharge Exercise Prescription (Final Exercise Prescription Changes):  Exercise Prescription Changes - 12/07/22 1300       Response to Exercise   Blood Pressure (Admit) 152/76    Blood Pressure (Exercise) 128/72    Blood Pressure (Exit) 102/74    Heart Rate (Admit) 85 bpm    Heart Rate (Exercise) 109 bpm    Heart Rate (Exit) 100 bpm    Oxygen Saturation (Admit) 96 %    Oxygen Saturation (Exercise) 95 %    Oxygen Saturation (Exit) 97 %    Rating of Perceived Exertion (Exercise) 15    Perceived Dyspnea (Exercise) 3    Symptoms lightheaded, SOB    Duration Progress to 30 minutes of  aerobic without signs/symptoms of physical distress    Intensity THRR unchanged      Progression   Progression Continue to progress workloads to maintain intensity without signs/symptoms of physical distress.    Average METs 2.2      Resistance Training   Training Prescription Yes    Weight 4 lb    Reps 10-15      Interval Training   Interval Training No      Recumbant Bike   Level 1    Watts 25    Minutes 15    METs 2.49      REL-XR   Level 1    Minutes 15      T5 Nustep   Level 1    Minutes 15    METs 2      Oxygen   Maintain Oxygen Saturation 88% or higher  Nutrition:  Target Goals: Understanding of nutrition guidelines, daily intake of sodium 1500mg , cholesterol 200mg , calories 30% from fat and 7% or less from saturated fats, daily to have 5 or more servings of fruits and vegetables.  Education: All About Nutrition: -Group instruction provided by verbal, written material, interactive activities, discussions, models, and posters to present general guidelines for heart healthy nutrition including fat, fiber, MyPlate, the role of sodium in heart healthy nutrition, utilization of the nutrition label, and utilization of this knowledge for  meal planning. Follow up email sent as well. Written material given at graduation.   Biometrics:  Pre Biometrics - 11/16/22 1649       Pre Biometrics   Height 6\' 1"  (1.854 m)    Weight 349 lb 12.8 oz (158.7 kg)    Waist Circumference 57 inches    Hip Circumference 52 inches    Waist to Hip Ratio 1.1 %    BMI (Calculated) 46.16    Single Leg Stand 3.16 seconds              Nutrition Therapy Plan and Nutrition Goals:  Nutrition Therapy & Goals - 11/16/22 1427       Nutrition Therapy   Diet Heart healthy, low Na, T2DM MNT    Drug/Food Interactions Statins/Certain Fruits    Protein (specify units) 150g    Fiber 30 grams    Whole Grain Foods 3 servings    Saturated Fats 16 max. grams    Fruits and Vegetables 8 servings/day    Sodium 1.5 grams      Personal Nutrition Goals   Nutrition Goal ST: pair CHO rich foods with fiber, protein, and heart healthy fat - peanut butter with fruit, greek yogurt with fruit, vegetables and egg in grits, whole grain english muffin with scrambled eggs, etc. LT: Maintain A1C <7, limit Na <1.5g/day, continue with changes made    Comments 57 y.o. M admitted to pulmonary rehab for covid 19 condition. PMHx includes HTN, chronic diastolic CHF, T2DM, OSA. Medications reviewed jardiance, ozempic, xarelto, crestor, torsemide. Labs reviewed. He is mindful of his sodium and added sugar. He tries to use more fresh foods and he limits eating out. He reports making changes to his diet in 2022 - especially limiting sodium. he reports his MD increased his dose of ozempic about 3 weeks ago and he is doing well on it. B: grits or piece of fruit (ornage or banana) or scrambled egg in microwave. L: fruit or half sandwich or leftovers D: pork chop, meat with 2 vegetables. He aims to have grilled foods. His mother in law lives with them. Drinks: water, occassionally diet soda or diet tea. He uses olive oil, he does not use butter. Discussed heart healthy eating and T2DM MNT.       Intervention Plan   Intervention Prescribe, educate and counsel regarding individualized specific dietary modifications aiming towards targeted core components such as weight, hypertension, lipid management, diabetes, heart failure and other comorbidities.;Nutrition handout(s) given to patient.    Expected Outcomes Short Term Goal: Understand basic principles of dietary content, such as calories, fat, sodium, cholesterol and nutrients.;Short Term Goal: A plan has been developed with personal nutrition goals set during dietitian appointment.;Long Term Goal: Adherence to prescribed nutrition plan.             Nutrition Assessments:  MEDIFICTS Score Key: ?70 Need to make dietary changes  40-70 Heart Healthy Diet ? 40 Therapeutic Level Cholesterol Diet  Flowsheet Row Pulmonary Rehab from 11/16/2022  in [facility] Cardiac and Pulmonary Rehab  Picture Your Plate Total Score on Admission 63      Picture Your Plate Scores: <16 Unhealthy dietary pattern with much room for improvement. 41-50 Dietary pattern unlikely to meet recommendations for good health and room for improvement. 51-60 More healthful dietary pattern, with some room for improvement.  >60 Healthy dietary pattern, although there may be some specific behaviors that could be improved.   Nutrition Goals Re-Evaluation:   Nutrition Goals Discharge (Final Nutrition Goals Re-Evaluation):   Psychosocial: Target Goals: Acknowledge presence or absence of significant depression and/or stress, maximize coping skills, provide positive support system. Participant is able to verbalize types and ability to use techniques and skills needed for reducing stress and depression.   Education: Stress, Anxiety, and Depression - Group verbal and visual presentation to define topics covered.  Reviews how body is impacted by stress, anxiety, and depression.  Also discusses healthy ways to reduce stress and to treat/manage anxiety and depression.   Written material given at graduation.   Education: Sleep Hygiene -Provides group verbal and written instruction about how sleep can affect your health.  Define sleep hygiene, discuss sleep cycles and impact of sleep habits. Review good sleep hygiene tips.    Initial Review & Psychosocial Screening:  Initial Psych Review & Screening - 11/09/22 1034       Initial Review   Current issues with Current Stress Concerns    Source of Stress Concerns Chronic Illness    Comments Hector Hancock states that his health has been stressing him out. He had blood clots and has been having alot of bouts with shortness of breath. He has the stress of medical bills and not working.      Family Dynamics   Good Support System? Yes    Comments He can look to his wife, son, parents and four sisters for support.      Barriers   Psychosocial barriers to participate in program The patient should benefit from training in stress management and relaxation.      Screening Interventions   Interventions Encouraged to exercise;To provide support and resources with identified psychosocial needs;Provide feedback about the scores to participant    Expected Outcomes Short Term goal: Utilizing psychosocial counselor, staff and physician to assist with identification of specific Stressors or current issues interfering with healing process. Setting desired goal for each stressor or current issue identified.;Long Term Goal: Stressors or current issues are controlled or eliminated.;Short Term goal: Identification and review with participant of any Quality of Life or Depression concerns found by scoring the questionnaire.;Long Term goal: The participant improves quality of Life and PHQ9 Scores as seen by post scores and/or verbalization of changes             Quality of Life Scores:  Scores of 19 and below usually indicate a poorer quality of life in these areas.  A difference of  2-3 points is a clinically meaningful difference.  A  difference of 2-3 points in the total score of the Quality of Life Index has been associated with significant improvement in overall quality of life, self-image, physical symptoms, and general health in studies assessing change in quality of life.  PHQ-9: Review Flowsheet       11/16/2022 04/24/2021  Depression screen PHQ 2/9  Decreased Interest 0 0  Down, Depressed, Hopeless 0 0  PHQ - 2 Score 0 0  Altered sleeping 3 -  Tired, decreased energy 1 -  Change in appetite 1 -  Feeling bad or failure about yourself  3 -  Trouble concentrating 1 -  Moving slowly or fidgety/restless 0 -  Suicidal thoughts 0 -  PHQ-9 Score 9 -  Difficult doing work/chores Extremely dIfficult -   Interpretation of Total Score  Total Score Depression Severity:  1-4 = Minimal depression, 5-9 = Mild depression, 10-14 = Moderate depression, 15-19 = Moderately severe depression, 20-27 = Severe depression   Psychosocial Evaluation and Intervention:  Psychosocial Evaluation - 11/09/22 1036       Psychosocial Evaluation & Interventions   Interventions Encouraged to exercise with the program and follow exercise prescription;Relaxation education;Stress management education    Comments Hector Hancock states that his health has been stressing him out. He had blood clots and has been having alot of bouts with shortness of breath. He has the stress of medical bills and not working.He can look to his wife, son, parents and four sisters for support.    Expected Outcomes Short: Start [facility] to help with mood. Long: Maintain a healthy mental state.    Continue Psychosocial Services  Follow up required by staff             Psychosocial Re-Evaluation:   Psychosocial Discharge (Final Psychosocial Re-Evaluation):   Education: Education Goals: Education classes will be provided on a weekly basis, covering required topics. Participant will state understanding/return demonstration of topics presented.  Learning  Barriers/Preferences:  Learning Barriers/Preferences - 11/09/22 1031       Learning Barriers/Preferences   Learning Barriers None    Learning Preferences None             General Pulmonary Education Topics:  Infection Prevention: - Provides verbal and written material to individual with discussion of infection control including proper hand washing and proper equipment cleaning during exercise session. Flowsheet Row Pulmonary Rehab from 11/16/2022 in [facility] Cardiac and Pulmonary Rehab  Date 11/16/22  Educator [facility]  Instruction Review Code 1- Verbalizes Understanding       Falls Prevention: - Provides verbal and written material to individual with discussion of falls prevention and safety. Flowsheet Row Pulmonary Rehab from 11/16/2022 in [facility] Cardiac and Pulmonary Rehab  Date 11/16/22  Educator [facility]  Instruction Review Code 1- Verbalizes Understanding       Chronic Lung Disease Review: - Group verbal instruction with posters, models, PowerPoint presentations and videos,  to review new updates, new respiratory medications, new advancements in procedures and treatments. Providing information on websites and "800" numbers for continued self-education. Includes information about supplement oxygen, available portable oxygen systems, continuous and intermittent flow rates, oxygen safety, concentrators, and Medicare reimbursement for oxygen. Explanation of Pulmonary Drugs, including class, frequency, complications, importance of spacers, rinsing mouth after steroid MDI's, and proper cleaning methods for nebulizers. Review of basic lung anatomy and physiology related to function, structure, and complications of lung disease. Review of risk factors. Discussion about methods for diagnosing sleep apnea and types of masks and machines for OSA. Includes a review of the use of types of environmental controls: home humidity, furnaces, filters, dust mite/pet prevention, HEPA vacuums. Discussion about  weather changes, air quality and the benefits of nasal washing. Instruction on Warning signs, infection symptoms, calling MD promptly, preventive modes, and value of vaccinations. Review of effective airway clearance, coughing and/or vibration techniques. Emphasizing that all should Create an Action Plan. Written material given at graduation. Flowsheet Row Pulmonary Rehab from 11/16/2022 in [facility] Cardiac and Pulmonary Rehab  Education need identified 11/16/22       AED/CPR: -  Group verbal and written instruction with the use of models to demonstrate the basic use of the AED with the basic ABC's of resuscitation.    Anatomy and Cardiac Procedures: - Group verbal and visual presentation and models provide information about basic cardiac anatomy and function. Reviews the testing methods done to diagnose heart disease and the outcomes of the test results. Describes the treatment choices: Medical Management, Angioplasty, or Coronary Bypass Surgery for treating various heart conditions including Myocardial Infarction, Angina, Valve Disease, and Cardiac Arrhythmias.  Written material given at graduation.   Medication Safety: - Group verbal and visual instruction to review commonly prescribed medications for heart and lung disease. Reviews the medication, class of the drug, and side effects. Includes the steps to properly store meds and maintain the prescription regimen.  Written material given at graduation.   Other: -Provides group and verbal instruction on various topics (see comments)   Knowledge Questionnaire Score:  Knowledge Questionnaire Score - 11/16/22 1650       Knowledge Questionnaire Score   Pre Score 15/18              Core Components/Risk Factors/Patient Goals at Admission:  Personal Goals and Risk Factors at Admission - 11/16/22 1652       Core Components/Risk Factors/Patient Goals on Admission    Weight Management Yes;Weight Loss    Intervention Weight Management:  Develop a combined nutrition and exercise program designed to reach desired caloric intake, while maintaining appropriate intake of nutrient and fiber, sodium and fats, and appropriate energy expenditure required for the weight goal.;Weight Management: Provide education and appropriate resources to help participant work on and attain dietary goals.;Weight Management/Obesity: Establish reasonable short term and long term weight goals.;Obesity: Provide education and appropriate resources to help participant work on and attain dietary goals.    Admit Weight 349 lb 12.8 oz (158.7 kg)    Goal Weight: Short Term 340 lb (154.2 kg)    Goal Weight: Long Term 300 lb (136.1 kg)    Expected Outcomes Short Term: Continue to assess and modify interventions until short term weight is achieved;Long Term: Adherence to nutrition and physical activity/exercise program aimed toward attainment of established weight goal;Weight Loss: Understanding of general recommendations for a balanced deficit meal plan, which promotes 1-2 lb weight loss per week and includes a negative energy balance of [phone] kcal/d;Understanding recommendations for meals to include 15-35% energy as protein, 25-35% energy from fat, 35-60% energy from carbohydrates, less than 200mg  of dietary cholesterol, 20-35 gm of total fiber daily;Understanding of distribution of calorie intake throughout the day with the consumption of 4-5 meals/snacks    Improve shortness of breath with ADL's Yes    Intervention Provide education, individualized exercise plan and daily activity instruction to help decrease symptoms of SOB with activities of daily living.    Expected Outcomes Short Term: Improve cardiorespiratory fitness to achieve a reduction of symptoms when performing ADLs;Long Term: Be able to perform more ADLs without symptoms or delay the onset of symptoms    Diabetes Yes    Intervention Provide education about signs/symptoms and action to take for  hypo/hyperglycemia.;Provide education about proper nutrition, including hydration, and aerobic/resistive exercise prescription along with prescribed medications to achieve blood glucose in normal ranges: Fasting glucose 65-99 mg/dL    Expected Outcomes Long Term: Attainment of HbA1C < 7%.;Short Term: Participant verbalizes understanding of the signs/symptoms and immediate care of hyper/hypoglycemia, proper foot care and importance of medication, aerobic/resistive exercise and nutrition plan for blood glucose control.  Heart Failure Yes    Intervention Provide a combined exercise and nutrition program that is supplemented with education, support and counseling about heart failure. Directed toward relieving symptoms such as shortness of breath, decreased exercise tolerance, and extremity edema.    Expected Outcomes Improve functional capacity of life;Short term: Attendance in program 2-3 days a week with increased exercise capacity. Reported lower sodium intake. Reported increased fruit and vegetable intake. Reports medication compliance.;Short term: Daily weights obtained and reported for increase. Utilizing diuretic protocols set by physician.;Long term: Adoption of self-care skills and reduction of barriers for early signs and symptoms recognition and intervention leading to self-care maintenance.    Hypertension Yes    Intervention Provide education on lifestyle modifcations including regular physical activity/exercise, weight management, moderate sodium restriction and increased consumption of fresh fruit, vegetables, and low fat dairy, alcohol moderation, and smoking cessation.;Monitor prescription use compliance.    Expected Outcomes Short Term: Continued assessment and intervention until BP is < 140/60mm HG in hypertensive participants. < 130/64mm HG in hypertensive participants with diabetes, heart failure or chronic kidney disease.;Long Term: Maintenance of blood pressure at goal levels.    Lipids  Yes    Intervention Provide education and support for participant on nutrition & aerobic/resistive exercise along with prescribed medications to achieve LDL 70mg , HDL >40mg .    Expected Outcomes Short Term: Participant states understanding of desired cholesterol values and is compliant with medications prescribed. Participant is following exercise prescription and nutrition guidelines.;Long Term: Cholesterol controlled with medications as prescribed, with individualized exercise RX and with personalized nutrition plan. Value goals: LDL < 70mg , HDL > 40 mg.             Education:Diabetes - Individual verbal and written instruction to review signs/symptoms of diabetes, desired ranges of glucose level fasting, after meals and with exercise. Acknowledge that pre and post exercise glucose checks will be done for 3 sessions at entry of program. Flowsheet Row Pulmonary Rehab from 11/16/2022 in [facility] Cardiac and Pulmonary Rehab  Date 11/16/22  Educator [facility]  Instruction Review Code 1- Verbalizes Understanding       Know Your Numbers and Heart Failure: - Group verbal and visual instruction to discuss disease risk factors for cardiac and pulmonary disease and treatment options.  Reviews associated critical values for Overweight/Obesity, Hypertension, Cholesterol, and Diabetes.  Discusses basics of heart failure: signs/symptoms and treatments.  Introduces Heart Failure Zone chart for action plan for heart failure.  Written material given at graduation.   Core Components/Risk Factors/Patient Goals Review:    Core Components/Risk Factors/Patient Goals at Discharge (Final Review):    ITP Comments:  ITP Comments     Row Name 11/09/22 1028 11/16/22 1639 11/19/22 1111 12/02/22 0900 12/09/22 1640   ITP Comments Virtual Visit completed. Patient informed on EP and RD appointment and 6 Minute walk test. Patient also informed of patient health questionnaires on My Chart. Patient Verbalizes understanding.  Visit diagnosis can be found in [facility] 10/19/2022. Completed and gym orientation. Initial ITP created and sent for review to Dr. Jinny Sanders, Medical Director. First full day of exercise!  Patient was oriented to gym and equipment including functions, settings, policies, and procedures.  Patient's individual exercise prescription and treatment plan were reviewed.  All starting workloads were established based on the results of the 6 minute walk test done at initial orientation visit.  The plan for exercise progression was also introduced and progression will be customized based on patient's performance and goals. 30 day review completed.  ITP sent to Dr. Jinny Sanders, Medical Director of  Pulmonary Rehab. Continue with ITP unless changes are made by physician. Patient called and asked to be placed on a medical hold with pulmonary rehab as he has been experiencing BP and BG issues. He has already spoke to his doctor about it and are in the middle of changing some medications. Patient would like to also wait until after his MRI in the next couple weeks to see what his results are regarding his syncopal episodes. Will place patient on medical hold at this time.    Row Name 12/30/22 0826           ITP Comments 30 Day review completed. Medical Director ITP review done, changes made as directed, and signed approval by Medical Director.   last visit 4/17 medical issues                Comments:

## 2022-12-31 ENCOUNTER — Ambulatory Visit: Payer: BC Managed Care – PPO

## 2023-01-05 ENCOUNTER — Ambulatory Visit: Payer: BC Managed Care – PPO

## 2023-01-07 ENCOUNTER — Ambulatory Visit: Payer: BC Managed Care – PPO

## 2023-01-12 ENCOUNTER — Ambulatory Visit: Payer: BC Managed Care – PPO

## 2023-01-14 ENCOUNTER — Ambulatory Visit: Payer: BC Managed Care – PPO

## 2023-01-19 ENCOUNTER — Ambulatory Visit: Payer: BC Managed Care – PPO

## 2023-01-20 ENCOUNTER — Encounter: Payer: BC Managed Care – PPO | Attending: Pediatrics

## 2023-01-20 DIAGNOSIS — U099 Post covid-19 condition, unspecified: Secondary | ICD-10-CM | POA: Insufficient documentation

## 2023-01-20 DIAGNOSIS — R0609 Other forms of dyspnea: Secondary | ICD-10-CM | POA: Insufficient documentation

## 2023-01-21 ENCOUNTER — Ambulatory Visit: Payer: BC Managed Care – PPO

## 2023-01-21 ENCOUNTER — Encounter: Payer: Self-pay | Admitting: *Deleted

## 2023-01-21 ENCOUNTER — Telehealth: Payer: Self-pay | Admitting: *Deleted

## 2023-01-21 DIAGNOSIS — U099 Post covid-19 condition, unspecified: Secondary | ICD-10-CM

## 2023-01-21 DIAGNOSIS — R0609 Other forms of dyspnea: Secondary | ICD-10-CM

## 2023-01-21 NOTE — Telephone Encounter (Signed)
Called to check up on patient.  He has been on medical hold since 12/02/22 due to heart work up.  Left message.

## 2023-01-26 ENCOUNTER — Ambulatory Visit: Payer: BC Managed Care – PPO

## 2023-01-27 ENCOUNTER — Encounter: Payer: Self-pay | Admitting: *Deleted

## 2023-01-27 DIAGNOSIS — U099 Post covid-19 condition, unspecified: Secondary | ICD-10-CM

## 2023-01-27 DIAGNOSIS — R0609 Other forms of dyspnea: Secondary | ICD-10-CM

## 2023-01-27 NOTE — Progress Notes (Signed)
Pulmonary Individual Treatment Plan  Patient Details  Name: Hector Hancock MRN: [id number] Date of Birth: 03-Sep-1965 Referring Provider:   Flowsheet Row Pulmonary Rehab from 11/16/2022 in [facility] Cardiac and Pulmonary Rehab  Referring Provider K. 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 (Patient not taking: Reported on 11/09/2022), Disp: , Rfl:    ENTRESTO 24-26 MG, Take 0.5 tablets by mouth 2 (two) times daily., Disp: , Rfl:    hydroxychloroquine (PLAQUENIL) 200 MG tablet, Take 200 mg by mouth 2 (two) times daily., Disp: , Rfl:    hydroxychloroquine (PLAQUENIL) 200 MG tablet, Take by mouth. (Patient not taking: Reported on 11/09/2022), Disp: , Rfl:    JARDIANCE 25 MG TABS tablet, Take 25 mg by mouth every morning., Disp: , Rfl:    OZEMPIC, 0.25 OR 0.5 MG/DOSE, 2 MG/3ML SOPN, Inject 0.5 mg into the skin once a week. sunday (Patient not taking: Reported on 11/09/2022), Disp: , Rfl:    OZEMPIC, 1 MG/DOSE, 4 MG/3ML SOPN, Inject 1 mg into the skin once a week., Disp: , Rfl:    rivaroxaban (XARELTO) 20 MG TABS tablet, Take 1 tablet (20 mg total) by mouth daily with supper. Starting 09/10/2022, Disp: 30 tablet, Rfl: 3   rosuvastatin (CRESTOR) 10 MG tablet, Take 10 mg by  mouth at bedtime. (Patient not taking: Reported on 11/09/2022), Disp: , Rfl:    spironolactone (ALDACTONE) 25 MG tablet, Take 1 tablet (25 mg total) by mouth daily. (Patient not taking: Reported on 11/09/2022), Disp: 30 tablet, Rfl: 3   spironolactone (ALDACTONE) 50 MG tablet, Take 1 tablet by mouth daily., Disp: , Rfl:    tapentadol (NUCYNTA) 50 MG tablet, , Disp: , Rfl:    torsemide (DEMADEX) 20 MG tablet, Take 1 tablet (20 mg total) by mouth daily. If increasing weight or edema, take a second 20mg  dose in evening, Disp: 30 tablet, Rfl: 3   torsemide (DEMADEX) 20 MG tablet, Take by mouth. (Patient not taking: Reported on 11/09/2022), Disp: , Rfl:    XARELTO 15 MG TABS tablet, Take 1 tablet (15 mg total) by mouth 2 (two) times daily with a meal for 10 days. Through Jan. 24th.  On Jan 25th, start taking 20 mg once daily with a meal., Disp: 20 tablet, Rfl: 0  Past Medical History: Past Medical History:  Diagnosis Date   Acute respiratory failure with hypoxia (HCC) 08/26/2022   Arthritis    History of kidney stones    Hypertension    Hypokalemia 08/29/2022    Tobacco Use: Social History   Tobacco Use  Smoking Status Never  Smokeless Tobacco Never    Labs: Review Flowsheet       Latest Ref Rng & Units 12/30/2011 06/18/2014  06/19/2014 08/26/2022  Labs for ITP Cardiac and Pulmonary Rehab  Cholestrol 0 - 200 mg/dL 161  096  045  -  LDL (calc) 0 - 100 mg/dL 409  91  83  -  HDL-C 40 - 60 mg/dL 46  35  29  -  Trlycerides 0 - 200 mg/dL 78  811  914  -  Hemoglobin A1c 4.8 - 5.6 % 5.6  - - 6.9      Pulmonary Assessment Scores:  Pulmonary Assessment Scores     Row Name 11/16/22 1655         ADL UCSD   ADL Phase Entry     SOB Score total 94     Rest 0     Walk 4     Stairs 5     Bath 5     Dress 4     Shop 5       CAT Score   CAT Score 21       mMRC Score   mMRC Score 2              UCSD: Self-administered rating of dyspnea associated with activities of daily living  (ADLs) 6-point scale (0 = "not at all" to 5 = "maximal or unable to do because of breathlessness")  Scoring Scores range from 0 to 120.  Minimally important difference is 5 units  CAT: CAT can identify the health impairment of COPD patients and is better correlated with disease progression.  CAT has a scoring range of zero to 40. The CAT score is classified into four groups of low (less than 10), medium (10 - 20), high (21-30) and very high (31-40) based on the impact level of disease on health status. A CAT score over 10 suggests significant symptoms.  A worsening CAT score could be explained by an exacerbation, poor medication adherence, poor inhaler technique, or progression of COPD or comorbid conditions.  CAT MCID is 2 points  mMRC: mMRC (Modified Medical Research Council) Dyspnea Scale is used to assess the degree of baseline functional disability in patients of respiratory disease due to dyspnea. No minimal important difference is established. A decrease in score of 1 point or greater is considered a positive change.   Pulmonary Function Assessment:  Pulmonary Function Assessment - 11/09/22 1030       Breath   Shortness of Breath Yes;Limiting activity             Exercise Target Goals: Exercise Program Goal: Individual exercise prescription set using results from initial 6 min walk test and THRR while considering  patient's activity barriers and safety.   Exercise Prescription Goal: Initial exercise prescription builds to 30-45 minutes a day of aerobic activity, 2-3 days per week.  Home exercise guidelines will be given to patient during program as part of exercise prescription that the participant will acknowledge.  Education: Aerobic Exercise: - Group verbal and visual presentation on the components of exercise prescription. Introduces F.I.T.T principle from ACSM for exercise prescriptions.  Reviews F.I.T.T. principles of aerobic exercise including progression. Written  material given at graduation.   Education: Resistance Exercise: - Group verbal and visual presentation on the components of exercise prescription. Introduces F.I.T.T principle from ACSM for exercise prescriptions  Reviews F.I.T.T. principles of resistance exercise including progression. Written material given at graduation.    Education: Exercise & Equipment Safety: - Individual verbal instruction and demonstration of equipment use and safety with use of the equipment. Flowsheet Row Pulmonary Rehab from  11/16/2022 in [facility] Cardiac and Pulmonary Rehab  Date 11/16/22  Educator [facility] [address]  Instruction Review Code 1- Verbalizes Understanding       Education: Exercise Physiology & General Exercise Guidelines: - Group verbal and written instruction with models to review the exercise physiology of the cardiovascular system and associated critical values. Provides general exercise guidelines with specific guidelines to those with heart or lung disease.    Education: Flexibility, Balance, Mind/Body Relaxation: - Group verbal and visual presentation with interactive activity on the components of exercise prescription. Introduces F.I.T.T principle from ACSM for exercise prescriptions. Reviews F.I.T.T. principles of flexibility and balance exercise training including progression. Also discusses the mind body connection.  Reviews various relaxation techniques to help reduce and manage stress (i.e. Deep breathing, progressive muscle relaxation, and visualization). Balance handout provided to take home. Written material given at graduation.   Activity Barriers & Risk Stratification:  Activity Barriers & Cardiac Risk Stratification - 11/16/22 1643       Activity Barriers & Cardiac Risk Stratification   Activity Barriers Other (comment)    Comments knee pain in both knees, carpel tunnel             6 Minute Walk:  6 Minute Walk     Row Name 11/16/22 1640         6 Minute Walk   Phase Initial      Distance 750 feet     Walk Time 4.5 minutes     # of Rest Breaks 2     MPH 1.89     METS 1.36     RPE 15     Perceived Dyspnea  3     VO2 Peak 4.76     Symptoms Yes (comment)     Comments SOB     Resting HR 87 bpm     Resting BP 102/62     Resting Oxygen Saturation  94 %     Exercise Oxygen Saturation  during 6 min walk 93 %     Max Ex. HR 112 bpm     Max Ex. BP 112/60     2 Minute Post BP 104/64       Interval HR   1 Minute HR 104     2 Minute HR 107     3 Minute HR 93  taken during rest break     4 Minute HR 103     5 Minute HR 111     6 Minute HR 112     2 Minute Post HR 95     Interval Heart Rate? Yes       Interval Oxygen   Interval Oxygen? Yes     Baseline Oxygen Saturation % 94 %     1 Minute Oxygen Saturation % 93 %     1 Minute Liters of Oxygen 0 L     2 Minute Oxygen Saturation % 94 %     2 Minute Liters of Oxygen 0 L     3 Minute Oxygen Saturation % 96 %     3 Minute Liters of Oxygen 0 L     4 Minute Oxygen Saturation % 96 %     4 Minute Liters of Oxygen 0 L     5 Minute Oxygen Saturation % 95 %     5 Minute Liters of Oxygen 0 L     6 Minute Oxygen Saturation % 95 %     6 Minute Liters of Oxygen 0  L     2 Minute Post Oxygen Saturation % 97 %     2 Minute Post Liters of Oxygen 0 L             Oxygen Initial Assessment:  Oxygen Initial Assessment - 11/16/22 1658       Home Oxygen   Home Oxygen Device None    Sleep Oxygen Prescription CPAP    Home Exercise Oxygen Prescription None    Home Resting Oxygen Prescription None    Compliance with Home Oxygen Use Yes      Initial 6 min Walk   Oxygen Used None      Program Oxygen Prescription   Program Oxygen Prescription None      Intervention   Short Term Goals To learn and demonstrate proper pursed lip breathing techniques or other breathing techniques. ;To learn and understand importance of monitoring SPO2 with pulse oximeter and demonstrate accurate use of the pulse oximeter.;To learn and  understand importance of maintaining oxygen saturations>88%    Long  Term Goals Maintenance of O2 saturations>88%;Exhibits compliance with exercise, home  and travel O2 prescription;Exhibits proper breathing techniques, such as pursed lip breathing or other method taught during program session;Verbalizes importance of monitoring SPO2 with pulse oximeter and return demonstration             Oxygen Re-Evaluation:   Oxygen Discharge (Final Oxygen Re-Evaluation):   Initial Exercise Prescription:  Initial Exercise Prescription - 11/16/22 1600       Date of Initial Exercise RX and Referring Provider   Date 11/16/22    Referring Provider K. Sapp      Oxygen   Maintain Oxygen Saturation 88% or higher      NuStep   Level 1    SPM 80    Minutes 15    METs 1.36      T5 Nustep   Level 1    SPM 80    Minutes 15    METs 1.36      Biostep-RELP   Level 1    SPM 50    Minutes 15    METs 1.36      Track   Laps 10    Minutes 15    METs 1.54      Prescription Details   Frequency (times per week) 2    Duration Progress to 30 minutes of continuous aerobic without signs/symptoms of physical distress      Intensity   THRR 40-80% of Max Heartrate 117-147    Ratings of Perceived Exertion 11-13    Perceived Dyspnea 0-4      Progression   Progression Continue to progress workloads to maintain intensity without signs/symptoms of physical distress.      Resistance Training   Training Prescription Yes    Weight 4    Reps 10-15             Perform Capillary Blood Glucose checks as needed.  Exercise Prescription Changes:   Exercise Prescription Changes     Row Name 11/16/22 1600 11/25/22 1600 12/07/22 1300         Response to Exercise   Blood Pressure (Admit) 102/62 124/74 152/76     Blood Pressure (Exercise) 112/60 136/72 128/72     Blood Pressure (Exit) 104/64 142/76 102/74     Heart Rate (Admit) 87 bpm 90 bpm 85 bpm     Heart Rate (Exercise) 112 bpm 114 bpm  109 bpm     Heart Rate (Exit)  95 bpm 97 bpm 100 bpm     Oxygen Saturation (Admit) 94 % 96 % 96 %     Oxygen Saturation (Exercise) 93 % 91 % 95 %     Oxygen Saturation (Exit) 97 % 95 % 97 %     Rating of Perceived Exertion (Exercise) 15 15 15      Perceived Dyspnea (Exercise) 3 3 3      Symptoms SOB SOB lightheaded, SOB     Comments 6 MWT results First full day of exercise --     Duration -- Progress to 30 minutes of  aerobic without signs/symptoms of physical distress Progress to 30 minutes of  aerobic without signs/symptoms of physical distress     Intensity -- THRR unchanged THRR unchanged       Progression   Progression -- Continue to progress workloads to maintain intensity without signs/symptoms of physical distress. Continue to progress workloads to maintain intensity without signs/symptoms of physical distress.     Average METs -- 1.96 2.2       Resistance Training   Training Prescription -- Yes Yes     Weight -- 4 lb 4 lb     Reps -- 10-15 10-15       Interval Training   Interval Training -- No No       Recumbant Bike   Level -- -- 1     Watts -- -- 25     Minutes -- -- 15     METs -- -- 2.49       REL-XR   Level -- -- 1     Minutes -- -- 15       T5 Nustep   Level -- 1 1     Minutes -- 15 15     METs -- 2 2       Track   Laps -- 17 --     Minutes -- 15 --     METs -- 1.92 --       Oxygen   Maintain Oxygen Saturation -- 88% or higher 88% or higher              Exercise Comments:   Exercise Comments     Row Name 11/19/22 1111           Exercise Comments First full day of exercise!  Patient was oriented to gym and equipment including functions, settings, policies, and procedures.  Patient's individual exercise prescription and treatment plan were reviewed.  All starting workloads were established based on the results of the 6 minute walk test done at initial orientation visit.  The plan for exercise progression was also introduced and progression will  be customized based on patient's performance and goals.                Exercise Goals and Review:   Exercise Goals     Row Name 11/16/22 1648             Exercise Goals   Increase Physical Activity Yes       Intervention Provide advice, education, support and counseling about physical activity/exercise needs.;Develop an individualized exercise prescription for aerobic and resistive training based on initial evaluation findings, risk stratification, comorbidities and participant's personal goals.       Expected Outcomes Short Term: Attend rehab on a regular basis to increase amount of physical activity.;Long Term: Add in home exercise to make exercise part of routine and to increase amount of physical activity.;Long Term: Exercising  regularly at least 3-5 days a week.       Increase Strength and Stamina Yes       Intervention Provide advice, education, support and counseling about physical activity/exercise needs.;Develop an individualized exercise prescription for aerobic and resistive training based on initial evaluation findings, risk stratification, comorbidities and participant's personal goals.       Expected Outcomes Short Term: Increase workloads from initial exercise prescription for resistance, speed, and METs.;Short Term: Perform resistance training exercises routinely during rehab and add in resistance training at home;Long Term: Improve cardiorespiratory fitness, muscular endurance and strength as measured by increased METs and functional capacity ( )       Able to understand and use rate of perceived exertion (RPE) scale Yes       Intervention Provide education and explanation on how to use RPE scale       Expected Outcomes Short Term: Able to use RPE daily in rehab to express subjective intensity level;Long Term:  Able to use RPE to guide intensity level when exercising independently       Able to understand and use Dyspnea scale Yes       Intervention Provide education  and explanation on how to use Dyspnea scale       Expected Outcomes Short Term: Able to use Dyspnea scale daily in rehab to express subjective sense of shortness of breath during exertion;Long Term: Able to use Dyspnea scale to guide intensity level when exercising independently       Knowledge and understanding of Target Heart Rate Range (THRR) Yes       Intervention Provide education and explanation of THRR including how the numbers were predicted and where they are located for reference       Expected Outcomes Short Term: Able to state/look up THRR;Long Term: Able to use THRR to govern intensity when exercising independently;Short Term: Able to use daily as guideline for intensity in rehab       Able to check pulse independently Yes       Intervention Provide education and demonstration on how to check pulse in carotid and radial arteries.;Review the importance of being able to check your own pulse for safety during independent exercise       Expected Outcomes Short Term: Able to explain why pulse checking is important during independent exercise       Understanding of Exercise Prescription Yes       Intervention Provide education, explanation, and written materials on patient's individual exercise prescription       Expected Outcomes Short Term: Able to explain program exercise prescription;Long Term: Able to explain home exercise prescription to exercise independently                Exercise Goals Re-Evaluation :  Exercise Goals Re-Evaluation     Row Name 11/19/22 1111 11/25/22 1610 12/07/22 1331 12/21/22 1351 01/04/23 1150     Exercise Goal Re-Evaluation   Exercise Goals Review Able to understand and use rate of perceived exertion (RPE) scale;Able to understand and use Dyspnea scale;Knowledge and understanding of Target Heart Rate Range (THRR);Understanding of Exercise Prescription Understanding of Exercise Prescription;Increase Physical Activity;Increase Strength and Stamina  Understanding of Exercise Prescription;Increase Physical Activity;Increase Strength and Stamina Understanding of Exercise Prescription;Increase Physical Activity;Increase Strength and Stamina Understanding of Exercise Prescription;Increase Physical Activity;Increase Strength and Stamina   Comments Reviewed RPE scale, THR and program prescription with pt today.  Pt voiced understanding and was given a copy of goals to take home. Tagg is  off to a good start in the program. He had an average MET level of 1.96 METs during his first session at rehab. He also was able to walk up 17 laps on the track and did well with level 1 on the T5 nustep. His O2 also saturations did not drop below 91% during his first day. We will continue to monitor his progress in the program. Tommy had only attended rehab a couple times since last review as he has been having some BP and BS issues. He just saw his doctor on 4/19 and changed some medications in hoping that will help resolve. He will call his doctor again today to follow up. At rehab, he reported some lightheadedness, BP were stable at that time. We will continue to monitor patient as long as he is appropriate to continue his exercise at rehab or if MD says otherwise. Patient has been placed on medical hold at this time per his request. He has been experiencing BP and BG issues. He has already spoke to his doctor about it and are in the middle of changing some medications. Patient would like to also wait until after his MRI in the next couple weeks to see what his results are regarding his syncopal episodes. We will continue to monitor his progress in the program when he returns to rehab. Patient remains on medical hold as he does not see his doctor for follow up on 6/3. He needs clearance to return to rehab. We will continue to follow up with patient on his cleared return date.   Expected Outcomes Short: Use RPE daily to regulate intensity. Long: Follow program prescription in  THR. Short: Continue to follow current exercise prescription. Long: Continue to increase overall MET level. Short: Return to rehab when appropriate Long: Build up overall strength and stamina Short: Return to rehab when appropriate. Long: Continue to improve strength and stamina. Short: Return to rehab when cleared Long: Graduate from [employer] Name 01/21/23 1509             Exercise Goal Re-Evaluation   Exercise Goals Review Understanding of Exercise Prescription;Increase Physical Activity;Increase Strength and Stamina       Comments We recently called to check up on patient. He has been on medical hold since 12/02/22 due to heart work up. We were not able to get in touch with Pt and left a message. He needs clearance to return to rehab. We will continue to follow up with patient on his cleared return date.       Expected Outcomes Short: Return to rehab when cleared. Long: Graduate from [employer].                Discharge Exercise Prescription (Final Exercise Prescription Changes):  Exercise Prescription Changes - 12/07/22 1300       Response to Exercise   Blood Pressure (Admit) 152/76    Blood Pressure (Exercise) 128/72    Blood Pressure (Exit) 102/74    Heart Rate (Admit) 85 bpm    Heart Rate (Exercise) 109 bpm    Heart Rate (Exit) 100 bpm    Oxygen Saturation (Admit) 96 %    Oxygen Saturation (Exercise) 95 %    Oxygen Saturation (Exit) 97 %    Rating of Perceived Exertion (Exercise) 15    Perceived Dyspnea (Exercise) 3    Symptoms lightheaded, SOB    Duration Progress to 30 minutes of  aerobic without signs/symptoms of physical distress    Intensity THRR  unchanged      Progression   Progression Continue to progress workloads to maintain intensity without signs/symptoms of physical distress.    Average METs 2.2      Resistance Training   Training Prescription Yes    Weight 4 lb    Reps 10-15      Interval Training   Interval Training No      Recumbant Bike    Level 1    Watts 25    Minutes 15    METs 2.49      REL-XR   Level 1    Minutes 15      T5 Nustep   Level 1    Minutes 15    METs 2      Oxygen   Maintain Oxygen Saturation 88% or higher             Nutrition:  Target Goals: Understanding of nutrition guidelines, daily intake of sodium 1500mg , cholesterol 200mg , calories 30% from fat and 7% or less from saturated fats, daily to have 5 or more servings of fruits and vegetables.  Education: All About Nutrition: -Group instruction provided by verbal, written material, interactive activities, discussions, models, and posters to present general guidelines for heart healthy nutrition including fat, fiber, MyPlate, the role of sodium in heart healthy nutrition, utilization of the nutrition label, and utilization of this knowledge for meal planning. Follow up email sent as well. Written material given at graduation.   Biometrics:  Pre Biometrics - 11/16/22 1649       Pre Biometrics   Height 6\' 1"  (1.854 m)    Weight 349 lb 12.8 oz (158.7 kg)    Waist Circumference 57 inches    Hip Circumference 52 inches    Waist to Hip Ratio 1.1 %    BMI (Calculated) 46.16    Single Leg Stand 3.16 seconds              Nutrition Therapy Plan and Nutrition Goals:  Nutrition Therapy & Goals - 11/16/22 1427       Nutrition Therapy   Diet Heart healthy, low Na, T2DM MNT    Drug/Food Interactions Statins/Certain Fruits    Protein (specify units) 150g    Fiber 30 grams    Whole Grain Foods 3 servings    Saturated Fats 16 max. grams    Fruits and Vegetables 8 servings/day    Sodium 1.5 grams      Personal Nutrition Goals   Nutrition Goal ST: pair CHO rich foods with fiber, protein, and heart healthy fat - peanut butter with fruit, greek yogurt with fruit, vegetables and egg in grits, whole grain english muffin with scrambled eggs, etc. LT: Maintain A1C <7, limit Na <1.5g/day, continue with changes made    Comments 57 y.o. M  admitted to pulmonary rehab for covid 19 condition. PMHx includes HTN, chronic diastolic CHF, T2DM, OSA. Medications reviewed jardiance, ozempic, xarelto, crestor, torsemide. Labs reviewed. He is mindful of his sodium and added sugar. He tries to use more fresh foods and he limits eating out. He reports making changes to his diet in 2022 - especially limiting sodium. he reports his MD increased his dose of ozempic about 3 weeks ago and he is doing well on it. B: grits or piece of fruit (ornage or banana) or scrambled egg in microwave. L: fruit or half sandwich or leftovers D: pork chop, meat with 2 vegetables. He aims to have grilled foods. His mother in law lives with  them. Drinks: water, occassionally diet soda or diet tea. He uses olive oil, he does not use butter. Discussed heart healthy eating and T2DM MNT.      Intervention Plan   Intervention Prescribe, educate and counsel regarding individualized specific dietary modifications aiming towards targeted core components such as weight, hypertension, lipid management, diabetes, heart failure and other comorbidities.;Nutrition handout(s) given to patient.    Expected Outcomes Short Term Goal: Understand basic principles of dietary content, such as calories, fat, sodium, cholesterol and nutrients.;Short Term Goal: A plan has been developed with personal nutrition goals set during dietitian appointment.;Long Term Goal: Adherence to prescribed nutrition plan.             Nutrition Assessments:  MEDIFICTS Score Key: ?70 Need to make dietary changes  40-70 Heart Healthy Diet ? 40 Therapeutic Level Cholesterol Diet  Flowsheet Row Pulmonary Rehab from 11/16/2022 in [facility]  Picture Your Plate Total Score on Admission 63      Picture Your Plate Scores: <16 Unhealthy dietary pattern with much room for improvement. 41-50 Dietary pattern unlikely to meet recommendations for good health and room for improvement. 51-60 More  healthful dietary pattern, with some room for improvement.  >60 Healthy dietary pattern, although there may be some specific behaviors that could be improved.   Nutrition Goals Re-Evaluation:   Nutrition Goals Discharge (Final Nutrition Goals Re-Evaluation):   Psychosocial: Target Goals: Acknowledge presence or absence of significant depression and/or stress, maximize coping skills, provide positive support system. Participant is able to verbalize types and ability to use techniques and skills needed for reducing stress and depression.   Education: Stress, Anxiety, and Depression - Group verbal and visual presentation to define topics covered.  Reviews how body is impacted by stress, anxiety, and depression.  Also discusses healthy ways to reduce stress and to treat/manage anxiety and depression.  Written material given at graduation.   Education: Sleep Hygiene -Provides group verbal and written instruction about how sleep can affect your health.  Define sleep hygiene, discuss sleep cycles and impact of sleep habits. Review good sleep hygiene tips.    Initial Review & Psychosocial Screening:  Initial Psych Review & Screening - 11/09/22 1034       Initial Review   Current issues with Current Stress Concerns    Source of Stress Concerns Chronic Illness    Comments Orvilla Fus states that his health has been stressing him out. He had blood clots and has been having alot of bouts with shortness of breath. He has the stress of medical bills and not working.      Family Dynamics   Good Support System? Yes    Comments He can look to his wife, son, parents and four sisters for support.      Barriers   Psychosocial barriers to participate in program The patient should benefit from training in stress management and relaxation.      Screening Interventions   Interventions Encouraged to exercise;To provide support and resources with identified psychosocial needs;Provide feedback about the scores  to participant    Expected Outcomes Short Term goal: Utilizing psychosocial counselor, staff and physician to assist with identification of specific Stressors or current issues interfering with healing process. Setting desired goal for each stressor or current issue identified.;Long Term Goal: Stressors or current issues are controlled or eliminated.;Short Term goal: Identification and review with participant of any Quality of Life or Depression concerns found by scoring the questionnaire.;Long Term goal: The participant improves quality  of Life and PHQ9 Scores as seen by post scores and/or verbalization of changes             Quality of Life Scores:  Scores of 19 and below usually indicate a poorer quality of life in these areas.  A difference of  2-3 points is a clinically meaningful difference.  A difference of 2-3 points in the total score of the Quality of Life Index has been associated with significant improvement in overall quality of life, self-image, physical symptoms, and general health in studies assessing change in quality of life.  PHQ-9: Review Flowsheet       11/16/2022 04/24/2021  Depression screen PHQ 2/9  Decreased Interest 0 0  Down, Depressed, Hopeless 0 0  PHQ - 2 Score 0 0  Altered sleeping 3 -  Tired, decreased energy 1 -  Change in appetite 1 -  Feeling bad or failure about yourself  3 -  Trouble concentrating 1 -  Moving slowly or fidgety/restless 0 -  Suicidal thoughts 0 -  PHQ-9 Score 9 -  Difficult doing work/chores Extremely dIfficult -   Interpretation of Total Score  Total Score Depression Severity:  1-4 = Minimal depression, 5-9 = Mild depression, 10-14 = Moderate depression, 15-19 = Moderately severe depression, 20-27 = Severe depression   Psychosocial Evaluation and Intervention:  Psychosocial Evaluation - 11/09/22 1036       Psychosocial Evaluation & Interventions   Interventions Encouraged to exercise with the program and follow exercise  prescription;Relaxation education;Stress management education    Comments Orvilla Fus states that his health has been stressing him out. He had blood clots and has been having alot of bouts with shortness of breath. He has the stress of medical bills and not working.He can look to his wife, son, parents and four sisters for support.    Expected Outcomes Short: Start [facility] to help with mood. Long: Maintain a healthy mental state.    Continue Psychosocial Services  Follow up required by staff             Psychosocial Re-Evaluation:   Psychosocial Discharge (Final Psychosocial Re-Evaluation):   Education: Education Goals: Education classes will be provided on a weekly basis, covering required topics. Participant will state understanding/return demonstration of topics presented.  Learning Barriers/Preferences:  Learning Barriers/Preferences - 11/09/22 1031       Learning Barriers/Preferences   Learning Barriers None    Learning Preferences None             General Pulmonary Education Topics:  Infection Prevention: - Provides verbal and written material to individual with discussion of infection control including proper hand washing and proper equipment cleaning during exercise session. Flowsheet Row Pulmonary Rehab from 11/16/2022 in [facility] Cardiac and Pulmonary Rehab  Date 11/16/22  Educator [facility]  Instruction Review Code 1- Verbalizes Understanding       Falls Prevention: - Provides verbal and written material to individual with discussion of falls prevention and safety. Flowsheet Row Pulmonary Rehab from 11/16/2022 in [facility] Cardiac and Pulmonary Rehab  Date 11/16/22  Educator [facility]  Instruction Review Code 1- Verbalizes Understanding       Chronic Lung Disease Review: - Group verbal instruction with posters, models, PowerPoint presentations and videos,  to review new updates, new respiratory medications, new advancements in procedures and treatments. Providing information on  websites and "800" numbers for continued self-education. Includes information about supplement oxygen, available portable oxygen systems, continuous and intermittent flow rates, oxygen safety, concentrators, and Medicare reimbursement for  oxygen. Explanation of Pulmonary Drugs, including class, frequency, complications, importance of spacers, rinsing mouth after steroid MDI's, and proper cleaning methods for nebulizers. Review of basic lung anatomy and physiology related to function, structure, and complications of lung disease. Review of risk factors. Discussion about methods for diagnosing sleep apnea and types of masks and machines for OSA. Includes a review of the use of types of environmental controls: home humidity, furnaces, filters, dust mite/pet prevention, HEPA vacuums. Discussion about weather changes, air quality and the benefits of nasal washing. Instruction on Warning signs, infection symptoms, calling MD promptly, preventive modes, and value of vaccinations. Review of effective airway clearance, coughing and/or vibration techniques. Emphasizing that all should Create an Action Plan. Written material given at graduation. Flowsheet Row Pulmonary Rehab from 11/16/2022 in [facility] Cardiac and Pulmonary Rehab  Education need identified 11/16/22       AED/CPR: - Group verbal and written instruction with the use of models to demonstrate the basic use of the AED with the basic ABC's of resuscitation.    Anatomy and Cardiac Procedures: - Group verbal and visual presentation and models provide information about basic cardiac anatomy and function. Reviews the testing methods done to diagnose heart disease and the outcomes of the test results. Describes the treatment choices: Medical Management, Angioplasty, or Coronary Bypass Surgery for treating various heart conditions including Myocardial Infarction, Angina, Valve Disease, and Cardiac Arrhythmias.  Written material given at graduation.   Medication  Safety: - Group verbal and visual instruction to review commonly prescribed medications for heart and lung disease. Reviews the medication, class of the drug, and side effects. Includes the steps to properly store meds and maintain the prescription regimen.  Written material given at graduation.   Other: -Provides group and verbal instruction on various topics (see comments)   Knowledge Questionnaire Score:  Knowledge Questionnaire Score - 11/16/22 1650       Knowledge Questionnaire Score   Pre Score 15/18              Core Components/Risk Factors/Patient Goals at Admission:  Personal Goals and Risk Factors at Admission - 11/16/22 1652       Core Components/Risk Factors/Patient Goals on Admission    Weight Management Yes;Weight Loss    Intervention Weight Management: Develop a combined nutrition and exercise program designed to reach desired caloric intake, while maintaining appropriate intake of nutrient and fiber, sodium and fats, and appropriate energy expenditure required for the weight goal.;Weight Management: Provide education and appropriate resources to help participant work on and attain dietary goals.;Weight Management/Obesity: Establish reasonable short term and long term weight goals.;Obesity: Provide education and appropriate resources to help participant work on and attain dietary goals.    Admit Weight 349 lb 12.8 oz (158.7 kg)    Goal Weight: Short Term 340 lb (154.2 kg)    Goal Weight: Long Term 300 lb (136.1 kg)    Expected Outcomes Short Term: Continue to assess and modify interventions until short term weight is achieved;Long Term: Adherence to nutrition and physical activity/exercise program aimed toward attainment of established weight goal;Weight Loss: Understanding of general recommendations for a balanced deficit meal plan, which promotes 1-2 lb weight loss per week and includes a negative energy balance of [phone] kcal/d;Understanding recommendations for  meals to include 15-35% energy as protein, 25-35% energy from fat, 35-60% energy from carbohydrates, less than 200mg  of dietary cholesterol, 20-35 gm of total fiber daily;Understanding of distribution of calorie intake throughout the day with the consumption of 4-5 meals/snacks  Improve shortness of breath with ADL's Yes    Intervention Provide education, individualized exercise plan and daily activity instruction to help decrease symptoms of SOB with activities of daily living.    Expected Outcomes Short Term: Improve cardiorespiratory fitness to achieve a reduction of symptoms when performing ADLs;Long Term: Be able to perform more ADLs without symptoms or delay the onset of symptoms    Diabetes Yes    Intervention Provide education about signs/symptoms and action to take for hypo/hyperglycemia.;Provide education about proper nutrition, including hydration, and aerobic/resistive exercise prescription along with prescribed medications to achieve blood glucose in normal ranges: Fasting glucose 65-99 mg/dL    Expected Outcomes Long Term: Attainment of HbA1C < 7%.;Short Term: Participant verbalizes understanding of the signs/symptoms and immediate care of hyper/hypoglycemia, proper foot care and importance of medication, aerobic/resistive exercise and nutrition plan for blood glucose control.    Heart Failure Yes    Intervention Provide a combined exercise and nutrition program that is supplemented with education, support and counseling about heart failure. Directed toward relieving symptoms such as shortness of breath, decreased exercise tolerance, and extremity edema.    Expected Outcomes Improve functional capacity of life;Short term: Attendance in program 2-3 days a week with increased exercise capacity. Reported lower sodium intake. Reported increased fruit and vegetable intake. Reports medication compliance.;Short term: Daily weights obtained and reported for increase. Utilizing diuretic protocols set  by physician.;Long term: Adoption of self-care skills and reduction of barriers for early signs and symptoms recognition and intervention leading to self-care maintenance.    Hypertension Yes    Intervention Provide education on lifestyle modifcations including regular physical activity/exercise, weight management, moderate sodium restriction and increased consumption of fresh fruit, vegetables, and low fat dairy, alcohol moderation, and smoking cessation.;Monitor prescription use compliance.    Expected Outcomes Short Term: Continued assessment and intervention until BP is < 140/49mm HG in hypertensive participants. < 130/75mm HG in hypertensive participants with diabetes, heart failure or chronic kidney disease.;Long Term: Maintenance of blood pressure at goal levels.    Lipids Yes    Intervention Provide education and support for participant on nutrition & aerobic/resistive exercise along with prescribed medications to achieve LDL 70mg , HDL >40mg .    Expected Outcomes Short Term: Participant states understanding of desired cholesterol values and is compliant with medications prescribed. Participant is following exercise prescription and nutrition guidelines.;Long Term: Cholesterol controlled with medications as prescribed, with individualized exercise RX and with personalized nutrition plan. Value goals: LDL < 70mg , HDL > 40 mg.             Education:Diabetes - Individual verbal and written instruction to review signs/symptoms of diabetes, desired ranges of glucose level fasting, after meals and with exercise. Acknowledge that pre and post exercise glucose checks will be done for 3 sessions at entry of program. Flowsheet Row Pulmonary Rehab from 11/16/2022 in [facility] Cardiac and Pulmonary Rehab  Date 11/16/22  Educator [facility]  Instruction Review Code 1- Verbalizes Understanding       Know Your Numbers and Heart Failure: - Group verbal and visual instruction to discuss disease risk factors for  cardiac and pulmonary disease and treatment options.  Reviews associated critical values for Overweight/Obesity, Hypertension, Cholesterol, and Diabetes.  Discusses basics of heart failure: signs/symptoms and treatments.  Introduces Heart Failure Zone chart for action plan for heart failure.  Written material given at graduation.   Core Components/Risk Factors/Patient Goals Review:    Core Components/Risk Factors/Patient Goals at Discharge (Final Review):    ITP  Comments:  ITP Comments     Row Name 11/09/22 1028 11/16/22 1639 11/19/22 1111 12/02/22 0900 12/09/22 1640   ITP Comments Virtual Visit completed. Patient informed on EP and RD appointment and 6 Minute walk test. Patient also informed of patient health questionnaires on My Chart. Patient Verbalizes understanding. Visit diagnosis can be found in [facility] 10/19/2022. Completed and gym orientation. Initial ITP created and sent for review to Dr. Jinny Sanders, Medical Director. First full day of exercise!  Patient was oriented to gym and equipment including functions, settings, policies, and procedures.  Patient's individual exercise prescription and treatment plan were reviewed.  All starting workloads were established based on the results of the 6 minute walk test done at initial orientation visit.  The plan for exercise progression was also introduced and progression will be customized based on patient's performance and goals. 30 day review completed. ITP sent to Dr. Jinny Sanders, Medical Director of  Pulmonary Rehab. Continue with ITP unless changes are made by physician. Patient called and asked to be placed on a medical hold with pulmonary rehab as he has been experiencing BP and BG issues. He has already spoke to his doctor about it and are in the middle of changing some medications. Patient would like to also wait until after his MRI in the next couple weeks to see what his results are regarding his syncopal episodes. Will place patient on  medical hold at this time.    Row Name 12/30/22 0826 12/30/22 1119 01/21/23 1434 01/27/23 0839     ITP Comments 30 Day review completed. Medical Director ITP review done, changes made as directed, and signed approval by Medical Director.   last visit 4/17 medical issues Called patient to follow up from medical hold, patient states his doctor is still adjusting his medications for his blood sugar and BP. He completed his MRI but does not follow up with his PCP about it until 6/3. Patient would like to hold off on rehab until he speaks with his doctor at his follow up appt on 6/3. Did inform patient he will need clearance from his MD before returning to rehab. Patient verbalized understanding. Will continue patient on medical hold at this time. Called to check up on patient.  He has been on medical hold since 12/02/22 due to heart work up.  Left message.  Unable to assess for goals. 30 Day review completed. Medical Director ITP review done, changes made as directed, and signed approval by Medical Director.   out since 4/17 medical             Comments:

## 2023-01-28 ENCOUNTER — Ambulatory Visit: Payer: BC Managed Care – PPO

## 2023-02-02 ENCOUNTER — Ambulatory Visit: Payer: BC Managed Care – PPO

## 2023-02-04 ENCOUNTER — Ambulatory Visit: Payer: BC Managed Care – PPO

## 2023-02-08 ENCOUNTER — Encounter: Payer: Self-pay | Admitting: *Deleted

## 2023-02-08 ENCOUNTER — Telehealth: Payer: Self-pay | Admitting: *Deleted

## 2023-02-08 NOTE — Telephone Encounter (Signed)
Attempted to call pt to follow up. He has been on medical hold since 12/02/22 due to cardiac work up. Unable to leave message. Voicemail is full.

## 2023-02-09 ENCOUNTER — Ambulatory Visit: Payer: BC Managed Care – PPO

## 2023-02-11 ENCOUNTER — Ambulatory Visit: Payer: BC Managed Care – PPO

## 2023-02-16 ENCOUNTER — Ambulatory Visit: Payer: BC Managed Care – PPO

## 2023-02-23 ENCOUNTER — Ambulatory Visit: Payer: BC Managed Care – PPO

## 2023-02-23 ENCOUNTER — Encounter: Payer: Self-pay | Admitting: *Deleted

## 2023-02-23 DIAGNOSIS — U099 Post covid-19 condition, unspecified: Secondary | ICD-10-CM

## 2023-02-23 DIAGNOSIS — R0609 Other forms of dyspnea: Secondary | ICD-10-CM

## 2023-02-23 NOTE — Progress Notes (Signed)
Pulmonary Individual Treatment Plan  Patient Details  Name: Hector Hancock MRN: [id number] Date of Birth: 17-Mar-1960 Referring Provider:   Flowsheet Row Pulmonary Rehab from 11/16/2022 in [facility] Cardiac and Pulmonary Rehab  Referring Provider K. Sapp       Initial Encounter Date:  Flowsheet Row Pulmonary Rehab from 11/16/2022 in [facility] Cardiac and Pulmonary Rehab  Date 11/16/22       Visit Diagnosis: DOE (dyspnea on exertion)  Post covid-19 condition, unspecified  Patient's Home Medications on Admission:  Current Outpatient Medications:    butalbital-acetaminophen-caffeine (FIORICET) 50-325-40 MG tablet, Take 1-2 tablets by mouth every 6 (six) hours as needed for headache., Disp: 14 tablet, Rfl: 0   diphenhydrAMINE (BENADRYL) 25 mg capsule, Take 50 mg by mouth at bedtime., Disp: , Rfl:    empagliflozin (JARDIANCE) 10 MG TABS tablet, Take by mouth., Disp: , Rfl:    enalapril (VASOTEC) 5 MG tablet, TAKE 1 TABLET (5 MG TOTAL) BY MOUTH DAILY., Disp: 90 tablet, Rfl: 2   enalapril (VASOTEC) 5 MG tablet, Take by mouth. (Patient not taking: Reported on 11/09/2022), Disp: , Rfl:    ENTRESTO 24-26 MG, Take 0.5 tablets by mouth 2 (two) times daily., Disp: , Rfl:    hydroxychloroquine (PLAQUENIL) 200 MG tablet, Take 200 mg by mouth 2 (two) times daily., Disp: , Rfl:    hydroxychloroquine (PLAQUENIL) 200 MG tablet, Take by mouth. (Patient not taking: Reported on 11/09/2022), Disp: , Rfl:    JARDIANCE 25 MG TABS tablet, Take 25 mg by mouth every morning., Disp: , Rfl:    OZEMPIC, 0.25 OR 0.5 MG/DOSE, 2 MG/3ML SOPN, Inject 0.5 mg into the skin once a week. sunday (Patient not taking: Reported on 11/09/2022), Disp: , Rfl:    OZEMPIC, 1 MG/DOSE, 4 MG/3ML SOPN, Inject 1 mg into the skin once a week., Disp: , Rfl:    rivaroxaban (XARELTO) 20 MG TABS tablet, Take 1 tablet (20 mg total) by mouth daily with supper. Starting 09/10/2022, Disp: 30 tablet, Rfl: 3   rosuvastatin (CRESTOR) 10 MG tablet, Take 10 mg by  mouth at bedtime. (Patient not taking: Reported on 11/09/2022), Disp: , Rfl:    spironolactone (ALDACTONE) 25 MG tablet, Take 1 tablet (25 mg total) by mouth daily. (Patient not taking: Reported on 11/09/2022), Disp: 30 tablet, Rfl: 3   spironolactone (ALDACTONE) 50 MG tablet, Take 1 tablet by mouth daily., Disp: , Rfl:    tapentadol (NUCYNTA) 50 MG tablet, , Disp: , Rfl:    torsemide (DEMADEX) 20 MG tablet, Take 1 tablet (20 mg total) by mouth daily. If increasing weight or edema, take a second 20mg  dose in evening, Disp: 30 tablet, Rfl: 3   torsemide (DEMADEX) 20 MG tablet, Take by mouth. (Patient not taking: Reported on 11/09/2022), Disp: , Rfl:    XARELTO 15 MG TABS tablet, Take 1 tablet (15 mg total) by mouth 2 (two) times daily with a meal for 10 days. Through Jan. 24th.  On Jan 25th, start taking 20 mg once daily with a meal., Disp: 20 tablet, Rfl: 0  Past Medical History: Past Medical History:  Diagnosis Date   Acute respiratory failure with hypoxia (HCC) 08/26/2022   Arthritis    History of kidney stones    Hypertension    Hypokalemia 08/29/2022    Tobacco Use: Social History   Tobacco Use  Smoking Status Never  Smokeless Tobacco Never    Labs: Review Flowsheet       Latest Ref Rng & Units 12/30/2011 06/18/2014  06/19/2014 08/26/2022  Labs for ITP Cardiac and Pulmonary Rehab  Cholestrol 0 - 200 mg/dL 409  811  914  -  LDL (calc) 0 - 100 mg/dL 782  91  83  -  HDL-C 40 - 60 mg/dL 46  35  29  -  Trlycerides 0 - 200 mg/dL 78  956  213  -  Hemoglobin A1c 4.8 - 5.6 % 5.6  - - 6.9      Pulmonary Assessment Scores:  Pulmonary Assessment Scores     Row Name 11/16/22 1655         ADL UCSD   ADL Phase Entry     SOB Score total 94     Rest 0     Walk 4     Stairs 5     Bath 5     Dress 4     Shop 5       CAT Score   CAT Score 21       mMRC Score   mMRC Score 2              UCSD: Self-administered rating of dyspnea associated with activities of daily living  (ADLs) 6-point scale (0 = "not at all" to 5 = "maximal or unable to do because of breathlessness")  Scoring Scores range from 0 to 120.  Minimally important difference is 5 units  CAT: CAT can identify the health impairment of COPD patients and is better correlated with disease progression.  CAT has a scoring range of zero to 40. The CAT score is classified into four groups of low (less than 10), medium (10 - 20), high (21-30) and very high (31-40) based on the impact level of disease on health status. A CAT score over 10 suggests significant symptoms.  A worsening CAT score could be explained by an exacerbation, poor medication adherence, poor inhaler technique, or progression of COPD or comorbid conditions.  CAT MCID is 2 points  mMRC: mMRC (Modified Medical Research Council) Dyspnea Scale is used to assess the degree of baseline functional disability in patients of respiratory disease due to dyspnea. No minimal important difference is established. A decrease in score of 1 point or greater is considered a positive change.   Pulmonary Function Assessment:  Pulmonary Function Assessment - 11/09/22 1030       Breath   Shortness of Breath Yes;Limiting activity             Exercise Target Goals: Exercise Program Goal: Individual exercise prescription set using results from initial 6 min walk test and THRR while considering  patient's activity barriers and safety.   Exercise Prescription Goal: Initial exercise prescription builds to 30-45 minutes a day of aerobic activity, 2-3 days per week.  Home exercise guidelines will be given to patient during program as part of exercise prescription that the participant will acknowledge.  Education: Aerobic Exercise: - Group verbal and visual presentation on the components of exercise prescription. Introduces F.I.T.T principle from ACSM for exercise prescriptions.  Reviews F.I.T.T. principles of aerobic exercise including progression. Written  material given at graduation.   Education: Resistance Exercise: - Group verbal and visual presentation on the components of exercise prescription. Introduces F.I.T.T principle from ACSM for exercise prescriptions  Reviews F.I.T.T. principles of resistance exercise including progression. Written material given at graduation.    Education: Exercise & Equipment Safety: - Individual verbal instruction and demonstration of equipment use and safety with use of the equipment. Flowsheet Row Pulmonary Rehab from  11/16/2022 in [facility] Cardiac and Pulmonary Rehab  Date 11/16/22  Educator [facility]  Instruction Review Code 1- Verbalizes Understanding       Education: Exercise Physiology & General Exercise Guidelines: - Group verbal and written instruction with models to review the exercise physiology of the cardiovascular system and associated critical values. Provides general exercise guidelines with specific guidelines to those with heart or lung disease.    Education: Flexibility, Balance, Mind/Body Relaxation: - Group verbal and visual presentation with interactive activity on the components of exercise prescription. Introduces F.I.T.T principle from ACSM for exercise prescriptions. Reviews F.I.T.T. principles of flexibility and balance exercise training including progression. Also discusses the mind body connection.  Reviews various relaxation techniques to help reduce and manage stress (i.e. Deep breathing, progressive muscle relaxation, and visualization). Balance handout provided to take home. Written material given at graduation.   Activity Barriers & Risk Stratification:  Activity Barriers & Cardiac Risk Stratification - 11/16/22 1643       Activity Barriers & Cardiac Risk Stratification   Activity Barriers Other (comment)    Comments knee pain in both knees, carpel tunnel             6 Minute Walk:  6 Minute Walk     Row Name 11/16/22 1640         6 Minute Walk   Phase Initial      Distance 750 feet     Walk Time 4.5 minutes     # of Rest Breaks 2     MPH 1.89     METS 1.36     RPE 15     Perceived Dyspnea  3     VO2 Peak 4.76     Symptoms Yes (comment)     Comments SOB     Resting HR 87 bpm     Resting BP 102/62     Resting Oxygen Saturation  94 %     Exercise Oxygen Saturation  during 6 min walk 93 %     Max Ex. HR 112 bpm     Max Ex. BP 112/60     2 Minute Post BP 104/64       Interval HR   1 Minute HR 104     2 Minute HR 107     3 Minute HR 93  taken during rest break     4 Minute HR 103     5 Minute HR 111     6 Minute HR 112     2 Minute Post HR 95     Interval Heart Rate? Yes       Interval Oxygen   Interval Oxygen? Yes     Baseline Oxygen Saturation % 94 %     1 Minute Oxygen Saturation % 93 %     1 Minute Liters of Oxygen 0 L     2 Minute Oxygen Saturation % 94 %     2 Minute Liters of Oxygen 0 L     3 Minute Oxygen Saturation % 96 %     3 Minute Liters of Oxygen 0 L     4 Minute Oxygen Saturation % 96 %     4 Minute Liters of Oxygen 0 L     5 Minute Oxygen Saturation % 95 %     5 Minute Liters of Oxygen 0 L     6 Minute Oxygen Saturation % 95 %     6 Minute Liters of Oxygen 0  L     2 Minute Post Oxygen Saturation % 97 %     2 Minute Post Liters of Oxygen 0 L             Oxygen Initial Assessment:  Oxygen Initial Assessment - 11/16/22 1658       Home Oxygen   Home Oxygen Device None    Sleep Oxygen Prescription CPAP    Home Exercise Oxygen Prescription None    Home Resting Oxygen Prescription None    Compliance with Home Oxygen Use Yes      Initial 6 min Walk   Oxygen Used None      Program Oxygen Prescription   Program Oxygen Prescription None      Intervention   Short Term Goals To learn and demonstrate proper pursed lip breathing techniques or other breathing techniques. ;To learn and understand importance of monitoring SPO2 with pulse oximeter and demonstrate accurate use of the pulse oximeter.;To learn and  understand importance of maintaining oxygen saturations>88%    Long  Term Goals Maintenance of O2 saturations>88%;Exhibits compliance with exercise, home  and travel O2 prescription;Exhibits proper breathing techniques, such as pursed lip breathing or other method taught during program session;Verbalizes importance of monitoring SPO2 with pulse oximeter and return demonstration             Oxygen Re-Evaluation:   Oxygen Discharge (Final Oxygen Re-Evaluation):   Initial Exercise Prescription:  Initial Exercise Prescription - 11/16/22 1600       Date of Initial Exercise RX and Referring Provider   Date 11/16/22    Referring Provider K. Sapp      Oxygen   Maintain Oxygen Saturation 88% or higher      NuStep   Level 1    SPM 80    Minutes 15    METs 1.36      T5 Nustep   Level 1    SPM 80    Minutes 15    METs 1.36      Biostep-RELP   Level 1    SPM 50    Minutes 15    METs 1.36      Track   Laps 10    Minutes 15    METs 1.54      Prescription Details   Frequency (times per week) 2    Duration Progress to 30 minutes of continuous aerobic without signs/symptoms of physical distress      Intensity   THRR 40-80% of Max Heartrate 117-147    Ratings of Perceived Exertion 11-13    Perceived Dyspnea 0-4      Progression   Progression Continue to progress workloads to maintain intensity without signs/symptoms of physical distress.      Resistance Training   Training Prescription Yes    Weight 4    Reps 10-15             Perform Capillary Blood Glucose checks as needed.  Exercise Prescription Changes:   Exercise Prescription Changes     Row Name 11/16/22 1600 11/25/22 1600 12/07/22 1300         Response to Exercise   Blood Pressure (Admit) 102/62 124/74 152/76     Blood Pressure (Exercise) 112/60 136/72 128/72     Blood Pressure (Exit) 104/64 142/76 102/74     Heart Rate (Admit) 87 bpm 90 bpm 85 bpm     Heart Rate (Exercise) 112 bpm 114 bpm  109 bpm     Heart Rate (Exit)  95 bpm 97 bpm 100 bpm     Oxygen Saturation (Admit) 94 % 96 % 96 %     Oxygen Saturation (Exercise) 93 % 91 % 95 %     Oxygen Saturation (Exit) 97 % 95 % 97 %     Rating of Perceived Exertion (Exercise) 15 15 15      Perceived Dyspnea (Exercise) 3 3 3      Symptoms SOB SOB lightheaded, SOB     Comments 6 MWT results First full day of exercise --     Duration -- Progress to 30 minutes of  aerobic without signs/symptoms of physical distress Progress to 30 minutes of  aerobic without signs/symptoms of physical distress     Intensity -- THRR unchanged THRR unchanged       Progression   Progression -- Continue to progress workloads to maintain intensity without signs/symptoms of physical distress. Continue to progress workloads to maintain intensity without signs/symptoms of physical distress.     Average METs -- 1.96 2.2       Resistance Training   Training Prescription -- Yes Yes     Weight -- 4 lb 4 lb     Reps -- 10-15 10-15       Interval Training   Interval Training -- No No       Recumbant Bike   Level -- -- 1     Watts -- -- 25     Minutes -- -- 15     METs -- -- 2.49       REL-XR   Level -- -- 1     Minutes -- -- 15       T5 Nustep   Level -- 1 1     Minutes -- 15 15     METs -- 2 2       Track   Laps -- 17 --     Minutes -- 15 --     METs -- 1.92 --       Oxygen   Maintain Oxygen Saturation -- 88% or higher 88% or higher              Exercise Comments:   Exercise Comments     Row Name 11/19/22 1111           Exercise Comments First full day of exercise!  Patient was oriented to gym and equipment including functions, settings, policies, and procedures.  Patient's individual exercise prescription and treatment plan were reviewed.  All starting workloads were established based on the results of the 6 minute walk test done at initial orientation visit.  The plan for exercise progression was also introduced and progression will  be customized based on patient's performance and goals.                Exercise Goals and Review:   Exercise Goals     Row Name 11/16/22 1648             Exercise Goals   Increase Physical Activity Yes       Intervention Provide advice, education, support and counseling about physical activity/exercise needs.;Develop an individualized exercise prescription for aerobic and resistive training based on initial evaluation findings, risk stratification, comorbidities and participant's personal goals.       Expected Outcomes Short Term: Attend rehab on a regular basis to increase amount of physical activity.;Long Term: Add in home exercise to make exercise part of routine and to increase amount of physical activity.;Long Term: Exercising  regularly at least 3-5 days a week.       Increase Strength and Stamina Yes       Intervention Provide advice, education, support and counseling about physical activity/exercise needs.;Develop an individualized exercise prescription for aerobic and resistive training based on initial evaluation findings, risk stratification, comorbidities and participant's personal goals.       Expected Outcomes Short Term: Increase workloads from initial exercise prescription for resistance, speed, and METs.;Short Term: Perform resistance training exercises routinely during rehab and add in resistance training at home;Long Term: Improve cardiorespiratory fitness, muscular endurance and strength as measured by increased METs and functional capacity ( )       Able to understand and use rate of perceived exertion (RPE) scale Yes       Intervention Provide education and explanation on how to use RPE scale       Expected Outcomes Short Term: Able to use RPE daily in rehab to express subjective intensity level;Long Term:  Able to use RPE to guide intensity level when exercising independently       Able to understand and use Dyspnea scale Yes       Intervention Provide education  and explanation on how to use Dyspnea scale       Expected Outcomes Short Term: Able to use Dyspnea scale daily in rehab to express subjective sense of shortness of breath during exertion;Long Term: Able to use Dyspnea scale to guide intensity level when exercising independently       Knowledge and understanding of Target Heart Rate Range (THRR) Yes       Intervention Provide education and explanation of THRR including how the numbers were predicted and where they are located for reference       Expected Outcomes Short Term: Able to state/look up THRR;Long Term: Able to use THRR to govern intensity when exercising independently;Short Term: Able to use daily as guideline for intensity in rehab       Able to check pulse independently Yes       Intervention Provide education and demonstration on how to check pulse in carotid and radial arteries.;Review the importance of being able to check your own pulse for safety during independent exercise       Expected Outcomes Short Term: Able to explain why pulse checking is important during independent exercise       Understanding of Exercise Prescription Yes       Intervention Provide education, explanation, and written materials on patient's individual exercise prescription       Expected Outcomes Short Term: Able to explain program exercise prescription;Long Term: Able to explain home exercise prescription to exercise independently                Exercise Goals Re-Evaluation :  Exercise Goals Re-Evaluation     Row Name 11/19/22 1111 11/25/22 1610 12/07/22 1331 12/21/22 1351 01/04/23 1150     Exercise Goal Re-Evaluation   Exercise Goals Review Able to understand and use rate of perceived exertion (RPE) scale;Able to understand and use Dyspnea scale;Knowledge and understanding of Target Heart Rate Range (THRR);Understanding of Exercise Prescription Understanding of Exercise Prescription;Increase Physical Activity;Increase Strength and Stamina  Understanding of Exercise Prescription;Increase Physical Activity;Increase Strength and Stamina Understanding of Exercise Prescription;Increase Physical Activity;Increase Strength and Stamina Understanding of Exercise Prescription;Increase Physical Activity;Increase Strength and Stamina   Comments Reviewed RPE scale, THR and program prescription with pt today.  Pt voiced understanding and was given a copy of goals to take home. Georffrey is  off to a good start in the program. He had an average MET level of 1.96 METs during his first session at rehab. He also was able to walk up 17 laps on the track and did well with level 1 on the T5 nustep. His O2 also saturations did not drop below 91% during his first day. We will continue to monitor his progress in the program. Tommy had only attended rehab a couple times since last review as he has been having some BP and BS issues. He just saw his doctor on 4/19 and changed some medications in hoping that will help resolve. He will call his doctor again today to follow up. At rehab, he reported some lightheadedness, BP were stable at that time. We will continue to monitor patient as long as he is appropriate to continue his exercise at rehab or if MD says otherwise. Patient has been placed on medical hold at this time per his request. He has been experiencing BP and BG issues. He has already spoke to his doctor about it and are in the middle of changing some medications. Patient would like to also wait until after his MRI in the next couple weeks to see what his results are regarding his syncopal episodes. We will continue to monitor his progress in the program when he returns to rehab. Patient remains on medical hold as he does not see his doctor for follow up on 6/3. He needs clearance to return to rehab. We will continue to follow up with patient on his cleared return date.   Expected Outcomes Short: Use RPE daily to regulate intensity. Long: Follow program prescription in  THR. Short: Continue to follow current exercise prescription. Long: Continue to increase overall MET level. Short: Return to rehab when appropriate Long: Build up overall strength and stamina Short: Return to rehab when appropriate. Long: Continue to improve strength and stamina. Short: Return to rehab when cleared Long: Graduate from [employer] Name 01/21/23 1509 02/03/23 1440 02/17/23 0810         Exercise Goal Re-Evaluation   Exercise Goals Review Understanding of Exercise Prescription;Increase Physical Activity;Increase Strength and Stamina Understanding of Exercise Prescription;Increase Physical Activity;Increase Strength and Stamina Understanding of Exercise Prescription;Increase Physical Activity;Increase Strength and Stamina     Comments We recently called to check up on patient. He has been on medical hold since 12/02/22 due to heart work up. We were not able to get in touch with Pt and left a message. He needs clearance to return to rehab. We will continue to follow up with patient on his cleared return date. We recently called to check up on patient. He has been on medical hold since 12/02/22 due to heart work up. We were not able to get in touch with Pt and left a message. He needs clearance to return to rehab. We will continue to follow up with patient on his cleared return date. We attempted to call pt to follow up. He has been on medical hold since 12/02/22 due to cardiac work up. Unable to leave message. Voicemail is full. We will continue to reach out to patient. If we do not hear back from pt, we will sen a discharge letter.     Expected Outcomes Short: Return to rehab when cleared. Long: Graduate from [employer]. Short: Return to rehab when cleared. Long: Graduate from [employer]. Short: Return to rehab when cleared. Long: Graduate from [employer].  Discharge Exercise Prescription (Final Exercise Prescription Changes):  Exercise Prescription Changes - 12/07/22 1300        Response to Exercise   Blood Pressure (Admit) 152/76    Blood Pressure (Exercise) 128/72    Blood Pressure (Exit) 102/74    Heart Rate (Admit) 85 bpm    Heart Rate (Exercise) 109 bpm    Heart Rate (Exit) 100 bpm    Oxygen Saturation (Admit) 96 %    Oxygen Saturation (Exercise) 95 %    Oxygen Saturation (Exit) 97 %    Rating of Perceived Exertion (Exercise) 15    Perceived Dyspnea (Exercise) 3    Symptoms lightheaded, SOB    Duration Progress to 30 minutes of  aerobic without signs/symptoms of physical distress    Intensity THRR unchanged      Progression   Progression Continue to progress workloads to maintain intensity without signs/symptoms of physical distress.    Average METs 2.2      Resistance Training   Training Prescription Yes    Weight 4 lb    Reps 10-15      Interval Training   Interval Training No      Recumbant Bike   Level 1    Watts 25    Minutes 15    METs 2.49      REL-XR   Level 1    Minutes 15      T5 Nustep   Level 1    Minutes 15    METs 2      Oxygen   Maintain Oxygen Saturation 88% or higher             Nutrition:  Target Goals: Understanding of nutrition guidelines, daily intake of sodium 1500mg , cholesterol 200mg , calories 30% from fat and 7% or less from saturated fats, daily to have 5 or more servings of fruits and vegetables.  Education: All About Nutrition: -Group instruction provided by verbal, written material, interactive activities, discussions, models, and posters to present general guidelines for heart healthy nutrition including fat, fiber, MyPlate, the role of sodium in heart healthy nutrition, utilization of the nutrition label, and utilization of this knowledge for meal planning. Follow up email sent as well. Written material given at graduation.   Biometrics:  Pre Biometrics - 11/16/22 1649       Pre Biometrics   Height 6\' 1"  (1.854 m)    Weight 349 lb 12.8 oz (158.7 kg)    Waist Circumference 57  inches    Hip Circumference 52 inches    Waist to Hip Ratio 1.1 %    BMI (Calculated) 46.16    Single Leg Stand 3.16 seconds              Nutrition Therapy Plan and Nutrition Goals:  Nutrition Therapy & Goals - 11/16/22 1427       Nutrition Therapy   Diet Heart healthy, low Na, T2DM MNT    Drug/Food Interactions Statins/Certain Fruits    Protein (specify units) 150g    Fiber 30 grams    Whole Grain Foods 3 servings    Saturated Fats 16 max. grams    Fruits and Vegetables 8 servings/day    Sodium 1.5 grams      Personal Nutrition Goals   Nutrition Goal ST: pair CHO rich foods with fiber, protein, and heart healthy fat - peanut butter with fruit, greek yogurt with fruit, vegetables and egg in grits, whole grain english muffin with scrambled eggs, etc. LT: Maintain A1C <  7, limit Na <1.5g/day, continue with changes made    Comments 57 y.o. M admitted to pulmonary rehab for covid 19 condition. PMHx includes HTN, chronic diastolic CHF, T2DM, OSA. Medications reviewed jardiance, ozempic, xarelto, crestor, torsemide. Labs reviewed. He is mindful of his sodium and added sugar. He tries to use more fresh foods and he limits eating out. He reports making changes to his diet in 2022 - especially limiting sodium. he reports his MD increased his dose of ozempic about 3 weeks ago and he is doing well on it. B: grits or piece of fruit (ornage or banana) or scrambled egg in microwave. L: fruit or half sandwich or leftovers D: pork chop, meat with 2 vegetables. He aims to have grilled foods. His mother in law lives with them. Drinks: water, occassionally diet soda or diet tea. He uses olive oil, he does not use butter. Discussed heart healthy eating and T2DM MNT.      Intervention Plan   Intervention Prescribe, educate and counsel regarding individualized specific dietary modifications aiming towards targeted core components such as weight, hypertension, lipid management, diabetes, heart failure and  other comorbidities.;Nutrition handout(s) given to patient.    Expected Outcomes Short Term Goal: Understand basic principles of dietary content, such as calories, fat, sodium, cholesterol and nutrients.;Short Term Goal: A plan has been developed with personal nutrition goals set during dietitian appointment.;Long Term Goal: Adherence to prescribed nutrition plan.             Nutrition Assessments:  MEDIFICTS Score Key: ?70 Need to make dietary changes  40-70 Heart Healthy Diet ? 40 Therapeutic Level Cholesterol Diet  Flowsheet Row Pulmonary Rehab from 11/16/2022 in [facility] Cardiac and Pulmonary Rehab  Picture Your Plate Total Score on Admission 63      Picture Your Plate Scores: <40 Unhealthy dietary pattern with much room for improvement. 41-50 Dietary pattern unlikely to meet recommendations for good health and room for improvement. 51-60 More healthful dietary pattern, with some room for improvement.  >60 Healthy dietary pattern, although there may be some specific behaviors that could be improved.   Nutrition Goals Re-Evaluation:   Nutrition Goals Discharge (Final Nutrition Goals Re-Evaluation):   Psychosocial: Target Goals: Acknowledge presence or absence of significant depression and/or stress, maximize coping skills, provide positive support system. Participant is able to verbalize types and ability to use techniques and skills needed for reducing stress and depression.   Education: Stress, Anxiety, and Depression - Group verbal and visual presentation to define topics covered.  Reviews how body is impacted by stress, anxiety, and depression.  Also discusses healthy ways to reduce stress and to treat/manage anxiety and depression.  Written material given at graduation.   Education: Sleep Hygiene -Provides group verbal and written instruction about how sleep can affect your health.  Define sleep hygiene, discuss sleep cycles and impact of sleep habits. Review good sleep  hygiene tips.    Initial Review & Psychosocial Screening:  Initial Psych Review & Screening - 11/09/22 1034       Initial Review   Current issues with Current Stress Concerns    Source of Stress Concerns Chronic Illness    Comments Orvilla Fus states that his health has been stressing him out. He had blood clots and has been having alot of bouts with shortness of breath. He has the stress of medical bills and not working.      Family Dynamics   Good Support System? Yes    Comments He can look to his  wife, son, parents and four sisters for support.      Barriers   Psychosocial barriers to participate in program The patient should benefit from training in stress management and relaxation.      Screening Interventions   Interventions Encouraged to exercise;To provide support and resources with identified psychosocial needs;Provide feedback about the scores to participant    Expected Outcomes Short Term goal: Utilizing psychosocial counselor, staff and physician to assist with identification of specific Stressors or current issues interfering with healing process. Setting desired goal for each stressor or current issue identified.;Long Term Goal: Stressors or current issues are controlled or eliminated.;Short Term goal: Identification and review with participant of any Quality of Life or Depression concerns found by scoring the questionnaire.;Long Term goal: The participant improves quality of Life and PHQ9 Scores as seen by post scores and/or verbalization of changes             Quality of Life Scores:  Scores of 19 and below usually indicate a poorer quality of life in these areas.  A difference of  2-3 points is a clinically meaningful difference.  A difference of 2-3 points in the total score of the Quality of Life Index has been associated with significant improvement in overall quality of life, self-image, physical symptoms, and general health in studies assessing change in quality of  life.  PHQ-9: Review Flowsheet       11/16/2022 04/24/2021  Depression screen PHQ 2/9  Decreased Interest 0 0  Down, Depressed, Hopeless 0 0  PHQ - 2 Score 0 0  Altered sleeping 3 -  Tired, decreased energy 1 -  Change in appetite 1 -  Feeling bad or failure about yourself  3 -  Trouble concentrating 1 -  Moving slowly or fidgety/restless 0 -  Suicidal thoughts 0 -  PHQ-9 Score 9 -  Difficult doing work/chores Extremely dIfficult -   Interpretation of Total Score  Total Score Depression Severity:  1-4 = Minimal depression, 5-9 = Mild depression, 10-14 = Moderate depression, 15-19 = Moderately severe depression, 20-27 = Severe depression   Psychosocial Evaluation and Intervention:  Psychosocial Evaluation - 11/09/22 1036       Psychosocial Evaluation & Interventions   Interventions Encouraged to exercise with the program and follow exercise prescription;Relaxation education;Stress management education    Comments Orvilla Fus states that his health has been stressing him out. He had blood clots and has been having alot of bouts with shortness of breath. He has the stress of medical bills and not working.He can look to his wife, son, parents and four sisters for support.    Expected Outcomes Short: Start [facility] to help with mood. Long: Maintain a healthy mental state.    Continue Psychosocial Services  Follow up required by staff             Psychosocial Re-Evaluation:   Psychosocial Discharge (Final Psychosocial Re-Evaluation):   Education: Education Goals: Education classes will be provided on a weekly basis, covering required topics. Participant will state understanding/return demonstration of topics presented.  Learning Barriers/Preferences:  Learning Barriers/Preferences - 11/09/22 1031       Learning Barriers/Preferences   Learning Barriers None    Learning Preferences None             General Pulmonary Education Topics:  Infection Prevention: - Provides  verbal and written material to individual with discussion of infection control including proper hand washing and proper equipment cleaning during exercise session. Flowsheet Row Pulmonary Rehab from  11/16/2022 in [facility] Cardiac and Pulmonary Rehab  Date 11/16/22  Educator [facility]  Instruction Review Code 1- Verbalizes Understanding       Falls Prevention: - Provides verbal and written material to individual with discussion of falls prevention and safety. Flowsheet Row Pulmonary Rehab from 11/16/2022 in [facility] Cardiac and Pulmonary Rehab  Date 11/16/22  Educator [facility]  Instruction Review Code 1- Verbalizes Understanding       Chronic Lung Disease Review: - Group verbal instruction with posters, models, PowerPoint presentations and videos,  to review new updates, new respiratory medications, new advancements in procedures and treatments. Providing information on websites and "800" numbers for continued self-education. Includes information about supplement oxygen, available portable oxygen systems, continuous and intermittent flow rates, oxygen safety, concentrators, and Medicare reimbursement for oxygen. Explanation of Pulmonary Drugs, including class, frequency, complications, importance of spacers, rinsing mouth after steroid MDI's, and proper cleaning methods for nebulizers. Review of basic lung anatomy and physiology related to function, structure, and complications of lung disease. Review of risk factors. Discussion about methods for diagnosing sleep apnea and types of masks and machines for OSA. Includes a review of the use of types of environmental controls: home humidity, furnaces, filters, dust mite/pet prevention, HEPA vacuums. Discussion about weather changes, air quality and the benefits of nasal washing. Instruction on Warning signs, infection symptoms, calling MD promptly, preventive modes, and value of vaccinations. Review of effective airway clearance, coughing and/or vibration techniques.  Emphasizing that all should Create an Action Plan. Written material given at graduation. Flowsheet Row Pulmonary Rehab from 11/16/2022 in [facility] Cardiac and Pulmonary Rehab  Education need identified 11/16/22       AED/CPR: - Group verbal and written instruction with the use of models to demonstrate the basic use of the AED with the basic ABC's of resuscitation.    Anatomy and Cardiac Procedures: - Group verbal and visual presentation and models provide information about basic cardiac anatomy and function. Reviews the testing methods done to diagnose heart disease and the outcomes of the test results. Describes the treatment choices: Medical Management, Angioplasty, or Coronary Bypass Surgery for treating various heart conditions including Myocardial Infarction, Angina, Valve Disease, and Cardiac Arrhythmias.  Written material given at graduation.   Medication Safety: - Group verbal and visual instruction to review commonly prescribed medications for heart and lung disease. Reviews the medication, class of the drug, and side effects. Includes the steps to properly store meds and maintain the prescription regimen.  Written material given at graduation.   Other: -Provides group and verbal instruction on various topics (see comments)   Knowledge Questionnaire Score:  Knowledge Questionnaire Score - 11/16/22 1650       Knowledge Questionnaire Score   Pre Score 15/18              Core Components/Risk Factors/Patient Goals at Admission:  Personal Goals and Risk Factors at Admission - 11/16/22 1652       Core Components/Risk Factors/Patient Goals on Admission    Weight Management Yes;Weight Loss    Intervention Weight Management: Develop a combined nutrition and exercise program designed to reach desired caloric intake, while maintaining appropriate intake of nutrient and fiber, sodium and fats, and appropriate energy expenditure required for the weight goal.;Weight Management:  Provide education and appropriate resources to help participant work on and attain dietary goals.;Weight Management/Obesity: Establish reasonable short term and long term weight goals.;Obesity: Provide education and appropriate resources to help participant work on and attain dietary goals.    Admit  Weight 349 lb 12.8 oz (158.7 kg)    Goal Weight: Short Term 340 lb (154.2 kg)    Goal Weight: Long Term 300 lb (136.1 kg)    Expected Outcomes Short Term: Continue to assess and modify interventions until short term weight is achieved;Long Term: Adherence to nutrition and physical activity/exercise program aimed toward attainment of established weight goal;Weight Loss: Understanding of general recommendations for a balanced deficit meal plan, which promotes 1-2 lb weight loss per week and includes a negative energy balance of [id number] kcal/d;Understanding recommendations for meals to include 15-35% energy as protein, 25-35% energy from fat, 35-60% energy from carbohydrates, less than 200mg  of dietary cholesterol, 20-35 gm of total fiber daily;Understanding of distribution of calorie intake throughout the day with the consumption of 4-5 meals/snacks    Improve shortness of breath with ADL's Yes    Intervention Provide education, individualized exercise plan and daily activity instruction to help decrease symptoms of SOB with activities of daily living.    Expected Outcomes Short Term: Improve cardiorespiratory fitness to achieve a reduction of symptoms when performing ADLs;Long Term: Be able to perform more ADLs without symptoms or delay the onset of symptoms    Diabetes Yes    Intervention Provide education about signs/symptoms and action to take for hypo/hyperglycemia.;Provide education about proper nutrition, including hydration, and aerobic/resistive exercise prescription along with prescribed medications to achieve blood glucose in normal ranges: Fasting glucose 65-99 mg/dL    Expected Outcomes Long Term:  Attainment of HbA1C < 7%.;Short Term: Participant verbalizes understanding of the signs/symptoms and immediate care of hyper/hypoglycemia, proper foot care and importance of medication, aerobic/resistive exercise and nutrition plan for blood glucose control.    Heart Failure Yes    Intervention Provide a combined exercise and nutrition program that is supplemented with education, support and counseling about heart failure. Directed toward relieving symptoms such as shortness of breath, decreased exercise tolerance, and extremity edema.    Expected Outcomes Improve functional capacity of life;Short term: Attendance in program 2-3 days a week with increased exercise capacity. Reported lower sodium intake. Reported increased fruit and vegetable intake. Reports medication compliance.;Short term: Daily weights obtained and reported for increase. Utilizing diuretic protocols set by physician.;Long term: Adoption of self-care skills and reduction of barriers for early signs and symptoms recognition and intervention leading to self-care maintenance.    Hypertension Yes    Intervention Provide education on lifestyle modifcations including regular physical activity/exercise, weight management, moderate sodium restriction and increased consumption of fresh fruit, vegetables, and low fat dairy, alcohol moderation, and smoking cessation.;Monitor prescription use compliance.    Expected Outcomes Short Term: Continued assessment and intervention until BP is < 140/35mm HG in hypertensive participants. < 130/48mm HG in hypertensive participants with diabetes, heart failure or chronic kidney disease.;Long Term: Maintenance of blood pressure at goal levels.    Lipids Yes    Intervention Provide education and support for participant on nutrition & aerobic/resistive exercise along with prescribed medications to achieve LDL 70mg , HDL >40mg .    Expected Outcomes Short Term: Participant states understanding of desired cholesterol  values and is compliant with medications prescribed. Participant is following exercise prescription and nutrition guidelines.;Long Term: Cholesterol controlled with medications as prescribed, with individualized exercise RX and with personalized nutrition plan. Value goals: LDL < 70mg , HDL > 40 mg.             Education:Diabetes - Individual verbal and written instruction to review signs/symptoms of diabetes, desired ranges of glucose level fasting, after meals  and with exercise. Acknowledge that pre and post exercise glucose checks will be done for 3 sessions at entry of program. Flowsheet Row Pulmonary Rehab from 11/16/2022 in [facility] Cardiac and Pulmonary Rehab  Date 11/16/22  Educator [facility]  Instruction Review Code 1- Verbalizes Understanding       Know Your Numbers and Heart Failure: - Group verbal and visual instruction to discuss disease risk factors for cardiac and pulmonary disease and treatment options.  Reviews associated critical values for Overweight/Obesity, Hypertension, Cholesterol, and Diabetes.  Discusses basics of heart failure: signs/symptoms and treatments.  Introduces Heart Failure Zone chart for action plan for heart failure.  Written material given at graduation.   Core Components/Risk Factors/Patient Goals Review:    Core Components/Risk Factors/Patient Goals at Discharge (Final Review):    ITP Comments:  ITP Comments     Row Name 11/09/22 1028 11/16/22 1639 11/19/22 1111 12/02/22 0900 12/09/22 1640   ITP Comments Virtual Visit completed. Patient informed on EP and RD appointment and 6 Minute walk test. Patient also informed of patient health questionnaires on My Chart. Patient Verbalizes understanding. Visit diagnosis can be found in [facility] 10/19/2022. Completed and gym orientation. Initial ITP created and sent for review to Dr. Jinny Sanders, Medical Director. First full day of exercise!  Patient was oriented to gym and equipment including functions, settings,  policies, and procedures.  Patient's individual exercise prescription and treatment plan were reviewed.  All starting workloads were established based on the results of the 6 minute walk test done at initial orientation visit.  The plan for exercise progression was also introduced and progression will be customized based on patient's performance and goals. 30 day review completed. ITP sent to Dr. Jinny Sanders, Medical Director of  Pulmonary Rehab. Continue with ITP unless changes are made by physician. Patient called and asked to be placed on a medical hold with pulmonary rehab as he has been experiencing BP and BG issues. He has already spoke to his doctor about it and are in the middle of changing some medications. Patient would like to also wait until after his MRI in the next couple weeks to see what his results are regarding his syncopal episodes. Will place patient on medical hold at this time.    Row Name 12/30/22 0826 12/30/22 1119 01/21/23 1434 01/27/23 0839 02/08/23 1623   ITP Comments 30 Day review completed. Medical Director ITP review done, changes made as directed, and signed approval by Medical Director.   last visit 4/17 medical issues Called patient to follow up from medical hold, patient states his doctor is still adjusting his medications for his blood sugar and BP. He completed his MRI but does not follow up with his PCP about it until 6/3. Patient would like to hold off on rehab until he speaks with his doctor at his follow up appt on 6/3. Did inform patient he will need clearance from his MD before returning to rehab. Patient verbalized understanding. Will continue patient on medical hold at this time. Called to check up on patient.  He has been on medical hold since 12/02/22 due to heart work up.  Left message.  Unable to assess for goals. 30 Day review completed. Medical Director ITP review done, changes made as directed, and signed approval by Medical Director.   out since 4/17 medical  Attempted to call pt to follow up. He has been on medical hold since 12/02/22 due to cardiac work up. Unable to leave message. Voicemail is full.  Row Name 02/23/23 1415           ITP Comments 30 Day review completed. Medical Director ITP review done, changes made as directed, and signed approval by Medical Director.   remains out for medical reason                Comments:

## 2023-02-25 ENCOUNTER — Ambulatory Visit: Payer: BC Managed Care – PPO

## 2023-03-01 ENCOUNTER — Encounter: Payer: Self-pay | Admitting: *Deleted

## 2023-03-01 DIAGNOSIS — R0609 Other forms of dyspnea: Secondary | ICD-10-CM

## 2023-03-01 DIAGNOSIS — U099 Post covid-19 condition, unspecified: Secondary | ICD-10-CM

## 2023-03-01 NOTE — Telephone Encounter (Signed)
Spoke with pt. He has had continued medical issues requiring further work up at this time and unable to attend rehab. He is waiting on vascular referral in [place] for blood clots in leg. He follows up with PCP this Friday. Pt agreed to discharge from cardiac rehab at this time. Pt understands he may return when medically cleared with new referral.

## 2023-03-01 NOTE — Progress Notes (Signed)
Pulmonary Individual Treatment Plan  Patient Details  Name: Hector Hancock MRN: [id number] Date of Birth: [birth year] Referring Provider:   Flowsheet Row Pulmonary Rehab from 11/16/2022 in [facility] Cardiac and Pulmonary Rehab  Referring Provider K. Sapp       Initial Encounter Date:  Flowsheet Row Pulmonary Rehab from 11/16/2022 in [facility] Cardiac and Pulmonary Rehab  Date 11/16/22       Visit Diagnosis: DOE (dyspnea on exertion)  Post covid-19 condition, unspecified  Patient's Home Medications on Admission:  Current Outpatient Medications:    butalbital-acetaminophen-caffeine (FIORICET) 50-325-40 MG tablet, Take 1-2 tablets by mouth every 6 (six) hours as needed for headache., Disp: 14 tablet, Rfl: 0   diphenhydrAMINE (BENADRYL) 25 mg capsule, Take 50 mg by mouth at bedtime., Disp: , Rfl:    empagliflozin (JARDIANCE) 10 MG TABS tablet, Take by mouth., Disp: , Rfl:    enalapril (VASOTEC) 5 MG tablet, TAKE 1 TABLET (5 MG TOTAL) BY MOUTH DAILY., Disp: 90 tablet, Rfl: 2   enalapril (VASOTEC) 5 MG tablet, Take by mouth. (Patient not taking: Reported on 11/09/2022), Disp: , Rfl:    ENTRESTO 24-26 MG, Take 0.5 tablets by mouth 2 (two) times daily., Disp: , Rfl:    hydroxychloroquine (PLAQUENIL) 200 MG tablet, Take 200 mg by mouth 2 (two) times daily., Disp: , Rfl:    hydroxychloroquine (PLAQUENIL) 200 MG tablet, Take by mouth. (Patient not taking: Reported on 11/09/2022), Disp: , Rfl:    JARDIANCE 25 MG TABS tablet, Take 25 mg by mouth every morning., Disp: , Rfl:    OZEMPIC, 0.25 OR 0.5 MG/DOSE, 2 MG/3ML SOPN, Inject 0.5 mg into the skin once a week. sunday (Patient not taking: Reported on 11/09/2022), Disp: , Rfl:    OZEMPIC, 1 MG/DOSE, 4 MG/3ML SOPN, Inject 1 mg into the skin once a week., Disp: , Rfl:    rivaroxaban (XARELTO) 20 MG TABS tablet, Take 1 tablet (20 mg total) by mouth daily with supper. Starting 09/10/2022, Disp: 30 tablet, Rfl: 3   rosuvastatin (CRESTOR) 10 MG tablet, Take 10 mg by  mouth at bedtime. (Patient not taking: Reported on 11/09/2022), Disp: , Rfl:    spironolactone (ALDACTONE) 25 MG tablet, Take 1 tablet (25 mg total) by mouth daily. (Patient not taking: Reported on 11/09/2022), Disp: 30 tablet, Rfl: 3   spironolactone (ALDACTONE) 50 MG tablet, Take 1 tablet by mouth daily., Disp: , Rfl:    tapentadol (NUCYNTA) 50 MG tablet, , Disp: , Rfl:    torsemide (DEMADEX) 20 MG tablet, Take 1 tablet (20 mg total) by mouth daily. If increasing weight or edema, take a second 20mg  dose in evening, Disp: 30 tablet, Rfl: 3   torsemide (DEMADEX) 20 MG tablet, Take by mouth. (Patient not taking: Reported on 11/09/2022), Disp: , Rfl:    XARELTO 15 MG TABS tablet, Take 1 tablet (15 mg total) by mouth 2 (two) times daily with a meal for 10 days. Through Jan. 24th.  On Jan 25th, start taking 20 mg once daily with a meal., Disp: 20 tablet, Rfl: 0  Past Medical History: Past Medical History:  Diagnosis Date   Acute respiratory failure with hypoxia (HCC) 08/26/2022   Arthritis    History of kidney stones    Hypertension    Hypokalemia 08/29/2022    Tobacco Use: Social History   Tobacco Use  Smoking Status Never  Smokeless Tobacco Never    Labs: Review Flowsheet       Latest Ref Rng & Units 12/30/2011 06/18/2014  06/19/2014 08/26/2022  Labs for ITP Cardiac and Pulmonary Rehab  Cholestrol 0 - 200 mg/dL 161  096  045  -  LDL (calc) 0 - 100 mg/dL 409  91  83  -  HDL-C 40 - 60 mg/dL 46  35  29  -  Trlycerides 0 - 200 mg/dL 78  811  914  -  Hemoglobin A1c 4.8 - 5.6 % 5.6  - - 6.9     Details             Pulmonary Assessment Scores:  Pulmonary Assessment Scores     Row Name 11/16/22 1655         ADL UCSD   ADL Phase Entry     SOB Score total 94     Rest 0     Walk 4     Stairs 5     Bath 5     Dress 4     Shop 5       CAT Score   CAT Score 21       mMRC Score   mMRC Score 2              UCSD: Self-administered rating of dyspnea associated with  activities of daily living (ADLs) 6-point scale (0 = "not at all" to 5 = "maximal or unable to do because of breathlessness")  Scoring Scores range from 0 to 120.  Minimally important difference is 5 units  CAT: CAT can identify the health impairment of COPD patients and is better correlated with disease progression.  CAT has a scoring range of zero to 40. The CAT score is classified into four groups of low (less than 10), medium (10 - 20), high (21-30) and very high (31-40) based on the impact level of disease on health status. A CAT score over 10 suggests significant symptoms.  A worsening CAT score could be explained by an exacerbation, poor medication adherence, poor inhaler technique, or progression of COPD or comorbid conditions.  CAT MCID is 2 points  mMRC: mMRC (Modified Medical Research Council) Dyspnea Scale is used to assess the degree of baseline functional disability in patients of respiratory disease due to dyspnea. No minimal important difference is established. A decrease in score of 1 point or greater is considered a positive change.   Pulmonary Function Assessment:  Pulmonary Function Assessment - 11/09/22 1030       Breath   Shortness of Breath Yes;Limiting activity             Exercise Target Goals: Exercise Program Goal: Individual exercise prescription set using results from initial 6 min walk test and THRR while considering  patient's activity barriers and safety.   Exercise Prescription Goal: Initial exercise prescription builds to 30-45 minutes a day of aerobic activity, 2-3 days per week.  Home exercise guidelines will be given to patient during program as part of exercise prescription that the participant will acknowledge.  Education: Aerobic Exercise: - Group verbal and visual presentation on the components of exercise prescription. Introduces F.I.T.T principle from ACSM for exercise prescriptions.  Reviews F.I.T.T. principles of aerobic exercise  including progression. Written material given at graduation.   Education: Resistance Exercise: - Group verbal and visual presentation on the components of exercise prescription. Introduces F.I.T.T principle from ACSM for exercise prescriptions  Reviews F.I.T.T. principles of resistance exercise including progression. Written material given at graduation.    Education: Exercise & Equipment Safety: - Individual verbal instruction and demonstration of equipment use  and safety with use of the equipment. Flowsheet Row Pulmonary Rehab from 11/16/2022 in [facility] Cardiac and Pulmonary Rehab  Date 11/16/22  Educator [facility]  Instruction Review Code 1- Verbalizes Understanding       Education: Exercise Physiology & General Exercise Guidelines: - Group verbal and written instruction with models to review the exercise physiology of the cardiovascular system and associated critical values. Provides general exercise guidelines with specific guidelines to those with heart or lung disease.    Education: Flexibility, Balance, Mind/Body Relaxation: - Group verbal and visual presentation with interactive activity on the components of exercise prescription. Introduces F.I.T.T principle from ACSM for exercise prescriptions. Reviews F.I.T.T. principles of flexibility and balance exercise training including progression. Also discusses the mind body connection.  Reviews various relaxation techniques to help reduce and manage stress (i.e. Deep breathing, progressive muscle relaxation, and visualization). Balance handout provided to take home. Written material given at graduation.   Activity Barriers & Risk Stratification:  Activity Barriers & Cardiac Risk Stratification - 11/16/22 1643       Activity Barriers & Cardiac Risk Stratification   Activity Barriers Other (comment)    Comments knee pain in both knees, carpel tunnel             6 Minute Walk:  6 Minute Walk     Row Name 11/16/22 1640         6  Minute Walk   Phase Initial     Distance 750 feet     Walk Time 4.5 minutes     # of Rest Breaks 2     MPH 1.89     METS 1.36     RPE 15     Perceived Dyspnea  3     VO2 Peak 4.76     Symptoms Yes (comment)     Comments SOB     Resting HR 87 bpm     Resting BP 102/62     Resting Oxygen Saturation  94 %     Exercise Oxygen Saturation  during 6 min walk 93 %     Max Ex. HR 112 bpm     Max Ex. BP 112/60     2 Minute Post BP 104/64       Interval HR   1 Minute HR 104     2 Minute HR 107     3 Minute HR 93  taken during rest break     4 Minute HR 103     5 Minute HR 111     6 Minute HR 112     2 Minute Post HR 95     Interval Heart Rate? Yes       Interval Oxygen   Interval Oxygen? Yes     Baseline Oxygen Saturation % 94 %     1 Minute Oxygen Saturation % 93 %     1 Minute Liters of Oxygen 0 L     2 Minute Oxygen Saturation % 94 %     2 Minute Liters of Oxygen 0 L     3 Minute Oxygen Saturation % 96 %     3 Minute Liters of Oxygen 0 L     4 Minute Oxygen Saturation % 96 %     4 Minute Liters of Oxygen 0 L     5 Minute Oxygen Saturation % 95 %     5 Minute Liters of Oxygen 0 L     6 Minute Oxygen Saturation %  95 %     6 Minute Liters of Oxygen 0 L     2 Minute Post Oxygen Saturation % 97 %     2 Minute Post Liters of Oxygen 0 L             Oxygen Initial Assessment:  Oxygen Initial Assessment - 11/16/22 1658       Home Oxygen   Home Oxygen Device None    Sleep Oxygen Prescription CPAP    Home Exercise Oxygen Prescription None    Home Resting Oxygen Prescription None    Compliance with Home Oxygen Use Yes      Initial 6 min Walk   Oxygen Used None      Program Oxygen Prescription   Program Oxygen Prescription None      Intervention   Short Term Goals To learn and demonstrate proper pursed lip breathing techniques or other breathing techniques. ;To learn and understand importance of monitoring SPO2 with pulse oximeter and demonstrate accurate use of the  pulse oximeter.;To learn and understand importance of maintaining oxygen saturations>88%    Long  Term Goals Maintenance of O2 saturations>88%;Exhibits compliance with exercise, home  and travel O2 prescription;Exhibits proper breathing techniques, such as pursed lip breathing or other method taught during program session;Verbalizes importance of monitoring SPO2 with pulse oximeter and return demonstration             Oxygen Re-Evaluation:   Oxygen Discharge (Final Oxygen Re-Evaluation):   Initial Exercise Prescription:  Initial Exercise Prescription - 11/16/22 1600       Date of Initial Exercise RX and Referring Provider   Date 11/16/22    Referring Provider K. Sapp      Oxygen   Maintain Oxygen Saturation 88% or higher      NuStep   Level 1    SPM 80    Minutes 15    METs 1.36      T5 Nustep   Level 1    SPM 80    Minutes 15    METs 1.36      Biostep-RELP   Level 1    SPM 50    Minutes 15    METs 1.36      Track   Laps 10    Minutes 15    METs 1.54      Prescription Details   Frequency (times per week) 2    Duration Progress to 30 minutes of continuous aerobic without signs/symptoms of physical distress      Intensity   THRR 40-80% of Max Heartrate 117-147    Ratings of Perceived Exertion 11-13    Perceived Dyspnea 0-4      Progression   Progression Continue to progress workloads to maintain intensity without signs/symptoms of physical distress.      Resistance Training   Training Prescription Yes    Weight 4    Reps 10-15             Perform Capillary Blood Glucose checks as needed.  Exercise Prescription Changes:   Exercise Prescription Changes     Row Name 11/16/22 1600 11/25/22 1600 12/07/22 1300         Response to Exercise   Blood Pressure (Admit) 102/62 124/74 152/76     Blood Pressure (Exercise) 112/60 136/72 128/72     Blood Pressure (Exit) 104/64 142/76 102/74     Heart Rate (Admit) 87 bpm 90 bpm 85 bpm     Heart Rate  (Exercise) 112  bpm 114 bpm 109 bpm     Heart Rate (Exit) 95 bpm 97 bpm 100 bpm     Oxygen Saturation (Admit) 94 % 96 % 96 %     Oxygen Saturation (Exercise) 93 % 91 % 95 %     Oxygen Saturation (Exit) 97 % 95 % 97 %     Rating of Perceived Exertion (Exercise) 15 15 15      Perceived Dyspnea (Exercise) 3 3 3      Symptoms SOB SOB lightheaded, SOB     Comments 6 MWT results First full day of exercise --     Duration -- Progress to 30 minutes of  aerobic without signs/symptoms of physical distress Progress to 30 minutes of  aerobic without signs/symptoms of physical distress     Intensity -- THRR unchanged THRR unchanged       Progression   Progression -- Continue to progress workloads to maintain intensity without signs/symptoms of physical distress. Continue to progress workloads to maintain intensity without signs/symptoms of physical distress.     Average METs -- 1.96 2.2       Resistance Training   Training Prescription -- Yes Yes     Weight -- 4 lb 4 lb     Reps -- 10-15 10-15       Interval Training   Interval Training -- No No       Recumbant Bike   Level -- -- 1     Watts -- -- 25     Minutes -- -- 15     METs -- -- 2.49       REL-XR   Level -- -- 1     Minutes -- -- 15       T5 Nustep   Level -- 1 1     Minutes -- 15 15     METs -- 2 2       Track   Laps -- 17 --     Minutes -- 15 --     METs -- 1.92 --       Oxygen   Maintain Oxygen Saturation -- 88% or higher 88% or higher              Exercise Comments:   Exercise Comments     Row Name 11/19/22 1111           Exercise Comments First full day of exercise!  Patient was oriented to gym and equipment including functions, settings, policies, and procedures.  Patient's individual exercise prescription and treatment plan were reviewed.  All starting workloads were established based on the results of the 6 minute walk test done at initial orientation visit.  The plan for exercise progression was also  introduced and progression will be customized based on patient's performance and goals.                Exercise Goals and Review:   Exercise Goals     Row Name 11/16/22 1648             Exercise Goals   Increase Physical Activity Yes       Intervention Provide advice, education, support and counseling about physical activity/exercise needs.;Develop an individualized exercise prescription for aerobic and resistive training based on initial evaluation findings, risk stratification, comorbidities and participant's personal goals.       Expected Outcomes Short Term: Attend rehab on a regular basis to increase amount of physical activity.;Long Term: Add in home exercise to make exercise  part of routine and to increase amount of physical activity.;Long Term: Exercising regularly at least 3-5 days a week.       Increase Strength and Stamina Yes       Intervention Provide advice, education, support and counseling about physical activity/exercise needs.;Develop an individualized exercise prescription for aerobic and resistive training based on initial evaluation findings, risk stratification, comorbidities and participant's personal goals.       Expected Outcomes Short Term: Increase workloads from initial exercise prescription for resistance, speed, and METs.;Short Term: Perform resistance training exercises routinely during rehab and add in resistance training at home;Long Term: Improve cardiorespiratory fitness, muscular endurance and strength as measured by increased METs and functional capacity ( )       Able to understand and use rate of perceived exertion (RPE) scale Yes       Intervention Provide education and explanation on how to use RPE scale       Expected Outcomes Short Term: Able to use RPE daily in rehab to express subjective intensity level;Long Term:  Able to use RPE to guide intensity level when exercising independently       Able to understand and use Dyspnea scale Yes        Intervention Provide education and explanation on how to use Dyspnea scale       Expected Outcomes Short Term: Able to use Dyspnea scale daily in rehab to express subjective sense of shortness of breath during exertion;Long Term: Able to use Dyspnea scale to guide intensity level when exercising independently       Knowledge and understanding of Target Heart Rate Range (THRR) Yes       Intervention Provide education and explanation of THRR including how the numbers were predicted and where they are located for reference       Expected Outcomes Short Term: Able to state/look up THRR;Long Term: Able to use THRR to govern intensity when exercising independently;Short Term: Able to use daily as guideline for intensity in rehab       Able to check pulse independently Yes       Intervention Provide education and demonstration on how to check pulse in carotid and radial arteries.;Review the importance of being able to check your own pulse for safety during independent exercise       Expected Outcomes Short Term: Able to explain why pulse checking is important during independent exercise       Understanding of Exercise Prescription Yes       Intervention Provide education, explanation, and written materials on patient's individual exercise prescription       Expected Outcomes Short Term: Able to explain program exercise prescription;Long Term: Able to explain home exercise prescription to exercise independently                Exercise Goals Re-Evaluation :  Exercise Goals Re-Evaluation     Row Name 11/19/22 1111 11/25/22 1610 12/07/22 1331 12/21/22 1351 01/04/23 1150     Exercise Goal Re-Evaluation   Exercise Goals Review Able to understand and use rate of perceived exertion (RPE) scale;Able to understand and use Dyspnea scale;Knowledge and understanding of Target Heart Rate Range (THRR);Understanding of Exercise Prescription Understanding of Exercise Prescription;Increase Physical Activity;Increase  Strength and Stamina Understanding of Exercise Prescription;Increase Physical Activity;Increase Strength and Stamina Understanding of Exercise Prescription;Increase Physical Activity;Increase Strength and Stamina Understanding of Exercise Prescription;Increase Physical Activity;Increase Strength and Stamina   Comments Reviewed RPE scale, THR and program prescription with pt today.  Pt voiced understanding  and was given a copy of goals to take home. Talmadge is off to a good start in the program. He had an average MET level of 1.96 METs during his first session at rehab. He also was able to walk up 17 laps on the track and did well with level 1 on the T5 nustep. His O2 also saturations did not drop below 91% during his first day. We will continue to monitor his progress in the program. Tommy had only attended rehab a couple times since last review as he has been having some BP and BS issues. He just saw his doctor on 4/19 and changed some medications in hoping that will help resolve. He will call his doctor again today to follow up. At rehab, he reported some lightheadedness, BP were stable at that time. We will continue to monitor patient as long as he is appropriate to continue his exercise at rehab or if MD says otherwise. Patient has been placed on medical hold at this time per his request. He has been experiencing BP and BG issues. He has already spoke to his doctor about it and are in the middle of changing some medications. Patient would like to also wait until after his MRI in the next couple weeks to see what his results are regarding his syncopal episodes. We will continue to monitor his progress in the program when he returns to rehab. Patient remains on medical hold as he does not see his doctor for follow up on 6/3. He needs clearance to return to rehab. We will continue to follow up with patient on his cleared return date.   Expected Outcomes Short: Use RPE daily to regulate intensity. Long: Follow  program prescription in THR. Short: Continue to follow current exercise prescription. Long: Continue to increase overall MET level. Short: Return to rehab when appropriate Long: Build up overall strength and stamina Short: Return to rehab when appropriate. Long: Continue to improve strength and stamina. Short: Return to rehab when cleared Long: Graduate from [employer] Name 01/21/23 1509 02/03/23 1440 02/17/23 0810         Exercise Goal Re-Evaluation   Exercise Goals Review Understanding of Exercise Prescription;Increase Physical Activity;Increase Strength and Stamina Understanding of Exercise Prescription;Increase Physical Activity;Increase Strength and Stamina Understanding of Exercise Prescription;Increase Physical Activity;Increase Strength and Stamina     Comments We recently called to check up on patient. He has been on medical hold since 12/02/22 due to heart work up. We were not able to get in touch with Pt and left a message. He needs clearance to return to rehab. We will continue to follow up with patient on his cleared return date. We recently called to check up on patient. He has been on medical hold since 12/02/22 due to heart work up. We were not able to get in touch with Pt and left a message. He needs clearance to return to rehab. We will continue to follow up with patient on his cleared return date. We attempted to call pt to follow up. He has been on medical hold since 12/02/22 due to cardiac work up. Unable to leave message. Voicemail is full. We will continue to reach out to patient. If we do not hear back from pt, we will sen a discharge letter.     Expected Outcomes Short: Return to rehab when cleared. Long: Graduate from [employer]. Short: Return to rehab when cleared. Long: Graduate from [employer]. Short: Return to rehab when cleared. Long: Graduate  from [employer].              Discharge Exercise Prescription (Final Exercise Prescription Changes):  Exercise Prescription  Changes - 12/07/22 1300       Response to Exercise   Blood Pressure (Admit) 152/76    Blood Pressure (Exercise) 128/72    Blood Pressure (Exit) 102/74    Heart Rate (Admit) 85 bpm    Heart Rate (Exercise) 109 bpm    Heart Rate (Exit) 100 bpm    Oxygen Saturation (Admit) 96 %    Oxygen Saturation (Exercise) 95 %    Oxygen Saturation (Exit) 97 %    Rating of Perceived Exertion (Exercise) 15    Perceived Dyspnea (Exercise) 3    Symptoms lightheaded, SOB    Duration Progress to 30 minutes of  aerobic without signs/symptoms of physical distress    Intensity THRR unchanged      Progression   Progression Continue to progress workloads to maintain intensity without signs/symptoms of physical distress.    Average METs 2.2      Resistance Training   Training Prescription Yes    Weight 4 lb    Reps 10-15      Interval Training   Interval Training No      Recumbant Bike   Level 1    Watts 25    Minutes 15    METs 2.49      REL-XR   Level 1    Minutes 15      T5 Nustep   Level 1    Minutes 15    METs 2      Oxygen   Maintain Oxygen Saturation 88% or higher             Nutrition:  Target Goals: Understanding of nutrition guidelines, daily intake of sodium 1500mg , cholesterol 200mg , calories 30% from fat and 7% or less from saturated fats, daily to have 5 or more servings of fruits and vegetables.  Education: All About Nutrition: -Group instruction provided by verbal, written material, interactive activities, discussions, models, and posters to present general guidelines for heart healthy nutrition including fat, fiber, MyPlate, the role of sodium in heart healthy nutrition, utilization of the nutrition label, and utilization of this knowledge for meal planning. Follow up email sent as well. Written material given at graduation.   Biometrics:  Pre Biometrics - 11/16/22 1649       Pre Biometrics   Height 6\' 1"  (1.854 m)    Weight 349 lb 12.8 oz (158.7 kg)     Waist Circumference 57 inches    Hip Circumference 52 inches    Waist to Hip Ratio 1.1 %    BMI (Calculated) 46.16    Single Leg Stand 3.16 seconds              Nutrition Therapy Plan and Nutrition Goals:  Nutrition Therapy & Goals - 11/16/22 1427       Nutrition Therapy   Diet Heart healthy, low Na, T2DM MNT    Drug/Food Interactions Statins/Certain Fruits    Protein (specify units) 150g    Fiber 30 grams    Whole Grain Foods 3 servings    Saturated Fats 16 max. grams    Fruits and Vegetables 8 servings/day    Sodium 1.5 grams      Personal Nutrition Goals   Nutrition Goal ST: pair CHO rich foods with fiber, protein, and heart healthy fat - peanut butter with fruit, greek yogurt with fruit, vegetables  and egg in grits, whole grain english muffin with scrambled eggs, etc. LT: Maintain A1C <7, limit Na <1.5g/day, continue with changes made    Comments 57 y.o. M admitted to pulmonary rehab for covid 19 condition. PMHx includes HTN, chronic diastolic CHF, T2DM, OSA. Medications reviewed jardiance, ozempic, xarelto, crestor, torsemide. Labs reviewed. He is mindful of his sodium and added sugar. He tries to use more fresh foods and he limits eating out. He reports making changes to his diet in 2022 - especially limiting sodium. he reports his MD increased his dose of ozempic about 3 weeks ago and he is doing well on it. B: grits or piece of fruit (ornage or banana) or scrambled egg in microwave. L: fruit or half sandwich or leftovers D: pork chop, meat with 2 vegetables. He aims to have grilled foods. His mother in law lives with them. Drinks: water, occassionally diet soda or diet tea. He uses olive oil, he does not use butter. Discussed heart healthy eating and T2DM MNT.      Intervention Plan   Intervention Prescribe, educate and counsel regarding individualized specific dietary modifications aiming towards targeted core components such as weight, hypertension, lipid management,  diabetes, heart failure and other comorbidities.;Nutrition handout(s) given to patient.    Expected Outcomes Short Term Goal: Understand basic principles of dietary content, such as calories, fat, sodium, cholesterol and nutrients.;Short Term Goal: A plan has been developed with personal nutrition goals set during dietitian appointment.;Long Term Goal: Adherence to prescribed nutrition plan.             Nutrition Assessments:  MEDIFICTS Score Key: ?70 Need to make dietary changes  40-70 Heart Healthy Diet ? 40 Therapeutic Level Cholesterol Diet  Flowsheet Row Pulmonary Rehab from 11/16/2022 in [facility] Cardiac and Pulmonary Rehab  Picture Your Plate Total Score on Admission 63      Picture Your Plate Scores: <16 Unhealthy dietary pattern with much room for improvement. 41-50 Dietary pattern unlikely to meet recommendations for good health and room for improvement. 51-60 More healthful dietary pattern, with some room for improvement.  >60 Healthy dietary pattern, although there may be some specific behaviors that could be improved.   Nutrition Goals Re-Evaluation:   Nutrition Goals Discharge (Final Nutrition Goals Re-Evaluation):   Psychosocial: Target Goals: Acknowledge presence or absence of significant depression and/or stress, maximize coping skills, provide positive support system. Participant is able to verbalize types and ability to use techniques and skills needed for reducing stress and depression.   Education: Stress, Anxiety, and Depression - Group verbal and visual presentation to define topics covered.  Reviews how body is impacted by stress, anxiety, and depression.  Also discusses healthy ways to reduce stress and to treat/manage anxiety and depression.  Written material given at graduation.   Education: Sleep Hygiene -Provides group verbal and written instruction about how sleep can affect your health.  Define sleep hygiene, discuss sleep cycles and impact of sleep  habits. Review good sleep hygiene tips.    Initial Review & Psychosocial Screening:  Initial Psych Review & Screening - 11/09/22 1034       Initial Review   Current issues with Current Stress Concerns    Source of Stress Concerns Chronic Illness    Comments Orvilla Fus states that his health has been stressing him out. He had blood clots and has been having alot of bouts with shortness of breath. He has the stress of medical bills and not working.      Family Dynamics  Good Support System? Yes    Comments He can look to his wife, son, parents and four sisters for support.      Barriers   Psychosocial barriers to participate in program The patient should benefit from training in stress management and relaxation.      Screening Interventions   Interventions Encouraged to exercise;To provide support and resources with identified psychosocial needs;Provide feedback about the scores to participant    Expected Outcomes Short Term goal: Utilizing psychosocial counselor, staff and physician to assist with identification of specific Stressors or current issues interfering with healing process. Setting desired goal for each stressor or current issue identified.;Long Term Goal: Stressors or current issues are controlled or eliminated.;Short Term goal: Identification and review with participant of any Quality of Life or Depression concerns found by scoring the questionnaire.;Long Term goal: The participant improves quality of Life and PHQ9 Scores as seen by post scores and/or verbalization of changes             Quality of Life Scores:  Scores of 19 and below usually indicate a poorer quality of life in these areas.  A difference of  2-3 points is a clinically meaningful difference.  A difference of 2-3 points in the total score of the Quality of Life Index has been associated with significant improvement in overall quality of life, self-image, physical symptoms, and general health in studies assessing  change in quality of life.  PHQ-9: Review Flowsheet       11/16/2022 04/24/2021  Depression screen PHQ 2/9  Decreased Interest 0 0  Down, Depressed, Hopeless 0 0  PHQ - 2 Score 0 0  Altered sleeping 3 -  Tired, decreased energy 1 -  Change in appetite 1 -  Feeling bad or failure about yourself  3 -  Trouble concentrating 1 -  Moving slowly or fidgety/restless 0 -  Suicidal thoughts 0 -  PHQ-9 Score 9 -  Difficult doing work/chores Extremely dIfficult -    Details           Interpretation of Total Score  Total Score Depression Severity:  1-4 = Minimal depression, 5-9 = Mild depression, 10-14 = Moderate depression, 15-19 = Moderately severe depression, 20-27 = Severe depression   Psychosocial Evaluation and Intervention:  Psychosocial Evaluation - 11/09/22 1036       Psychosocial Evaluation & Interventions   Interventions Encouraged to exercise with the program and follow exercise prescription;Relaxation education;Stress management education    Comments Orvilla Fus states that his health has been stressing him out. He had blood clots and has been having alot of bouts with shortness of breath. He has the stress of medical bills and not working.He can look to his wife, son, parents and four sisters for support.    Expected Outcomes Short: Start [facility] to help with mood. Long: Maintain a healthy mental state.    Continue Psychosocial Services  Follow up required by staff             Psychosocial Re-Evaluation:   Psychosocial Discharge (Final Psychosocial Re-Evaluation):   Education: Education Goals: Education classes will be provided on a weekly basis, covering required topics. Participant will state understanding/return demonstration of topics presented.  Learning Barriers/Preferences:  Learning Barriers/Preferences - 11/09/22 1031       Learning Barriers/Preferences   Learning Barriers None    Learning Preferences None             General Pulmonary  Education Topics:  Infection Prevention: - Provides verbal and  written material to individual with discussion of infection control including proper hand washing and proper equipment cleaning during exercise session. Flowsheet Row Pulmonary Rehab from 11/16/2022 in [facility] Cardiac and Pulmonary Rehab  Date 11/16/22  Educator [facility]  Instruction Review Code 1- Verbalizes Understanding       Falls Prevention: - Provides verbal and written material to individual with discussion of falls prevention and safety. Flowsheet Row Pulmonary Rehab from 11/16/2022 in [facility] Cardiac and Pulmonary Rehab  Date 11/16/22  Educator [facility]  Instruction Review Code 1- Verbalizes Understanding       Chronic Lung Disease Review: - Group verbal instruction with posters, models, PowerPoint presentations and videos,  to review new updates, new respiratory medications, new advancements in procedures and treatments. Providing information on websites and "800" numbers for continued self-education. Includes information about supplement oxygen, available portable oxygen systems, continuous and intermittent flow rates, oxygen safety, concentrators, and Medicare reimbursement for oxygen. Explanation of Pulmonary Drugs, including class, frequency, complications, importance of spacers, rinsing mouth after steroid MDI's, and proper cleaning methods for nebulizers. Review of basic lung anatomy and physiology related to function, structure, and complications of lung disease. Review of risk factors. Discussion about methods for diagnosing sleep apnea and types of masks and machines for OSA. Includes a review of the use of types of environmental controls: home humidity, furnaces, filters, dust mite/pet prevention, HEPA vacuums. Discussion about weather changes, air quality and the benefits of nasal washing. Instruction on Warning signs, infection symptoms, calling MD promptly, preventive modes, and value of vaccinations. Review of effective airway  clearance, coughing and/or vibration techniques. Emphasizing that all should Create an Action Plan. Written material given at graduation. Flowsheet Row Pulmonary Rehab from 11/16/2022 in [facility] Cardiac and Pulmonary Rehab  Education need identified 11/16/22       AED/CPR: - Group verbal and written instruction with the use of models to demonstrate the basic use of the AED with the basic ABC's of resuscitation.    Anatomy and Cardiac Procedures: - Group verbal and visual presentation and models provide information about basic cardiac anatomy and function. Reviews the testing methods done to diagnose heart disease and the outcomes of the test results. Describes the treatment choices: Medical Management, Angioplasty, or Coronary Bypass Surgery for treating various heart conditions including Myocardial Infarction, Angina, Valve Disease, and Cardiac Arrhythmias.  Written material given at graduation.   Medication Safety: - Group verbal and visual instruction to review commonly prescribed medications for heart and lung disease. Reviews the medication, class of the drug, and side effects. Includes the steps to properly store meds and maintain the prescription regimen.  Written material given at graduation.   Other: -Provides group and verbal instruction on various topics (see comments)   Knowledge Questionnaire Score:  Knowledge Questionnaire Score - 11/16/22 1650       Knowledge Questionnaire Score   Pre Score 15/18              Core Components/Risk Factors/Patient Goals at Admission:  Personal Goals and Risk Factors at Admission - 11/16/22 1652       Core Components/Risk Factors/Patient Goals on Admission    Weight Management Yes;Weight Loss    Intervention Weight Management: Develop a combined nutrition and exercise program designed to reach desired caloric intake, while maintaining appropriate intake of nutrient and fiber, sodium and fats, and appropriate energy expenditure  required for the weight goal.;Weight Management: Provide education and appropriate resources to help participant work on and attain dietary goals.;Weight Management/Obesity: Establish reasonable  short term and long term weight goals.;Obesity: Provide education and appropriate resources to help participant work on and attain dietary goals.    Admit Weight 349 lb 12.8 oz (158.7 kg)    Goal Weight: Short Term 340 lb (154.2 kg)    Goal Weight: Long Term 300 lb (136.1 kg)    Expected Outcomes Short Term: Continue to assess and modify interventions until short term weight is achieved;Long Term: Adherence to nutrition and physical activity/exercise program aimed toward attainment of established weight goal;Weight Loss: Understanding of general recommendations for a balanced deficit meal plan, which promotes 1-2 lb weight loss per week and includes a negative energy balance of [phone] kcal/d;Understanding recommendations for meals to include 15-35% energy as protein, 25-35% energy from fat, 35-60% energy from carbohydrates, less than 200mg  of dietary cholesterol, 20-35 gm of total fiber daily;Understanding of distribution of calorie intake throughout the day with the consumption of 4-5 meals/snacks    Improve shortness of breath with ADL's Yes    Intervention Provide education, individualized exercise plan and daily activity instruction to help decrease symptoms of SOB with activities of daily living.    Expected Outcomes Short Term: Improve cardiorespiratory fitness to achieve a reduction of symptoms when performing ADLs;Long Term: Be able to perform more ADLs without symptoms or delay the onset of symptoms    Diabetes Yes    Intervention Provide education about signs/symptoms and action to take for hypo/hyperglycemia.;Provide education about proper nutrition, including hydration, and aerobic/resistive exercise prescription along with prescribed medications to achieve blood glucose in normal ranges: Fasting  glucose 65-99 mg/dL    Expected Outcomes Long Term: Attainment of HbA1C < 7%.;Short Term: Participant verbalizes understanding of the signs/symptoms and immediate care of hyper/hypoglycemia, proper foot care and importance of medication, aerobic/resistive exercise and nutrition plan for blood glucose control.    Heart Failure Yes    Intervention Provide a combined exercise and nutrition program that is supplemented with education, support and counseling about heart failure. Directed toward relieving symptoms such as shortness of breath, decreased exercise tolerance, and extremity edema.    Expected Outcomes Improve functional capacity of life;Short term: Attendance in program 2-3 days a week with increased exercise capacity. Reported lower sodium intake. Reported increased fruit and vegetable intake. Reports medication compliance.;Short term: Daily weights obtained and reported for increase. Utilizing diuretic protocols set by physician.;Long term: Adoption of self-care skills and reduction of barriers for early signs and symptoms recognition and intervention leading to self-care maintenance.    Hypertension Yes    Intervention Provide education on lifestyle modifcations including regular physical activity/exercise, weight management, moderate sodium restriction and increased consumption of fresh fruit, vegetables, and low fat dairy, alcohol moderation, and smoking cessation.;Monitor prescription use compliance.    Expected Outcomes Short Term: Continued assessment and intervention until BP is < 140/45mm HG in hypertensive participants. < 130/68mm HG in hypertensive participants with diabetes, heart failure or chronic kidney disease.;Long Term: Maintenance of blood pressure at goal levels.    Lipids Yes    Intervention Provide education and support for participant on nutrition & aerobic/resistive exercise along with prescribed medications to achieve LDL 70mg , HDL >40mg .    Expected Outcomes Short Term:  Participant states understanding of desired cholesterol values and is compliant with medications prescribed. Participant is following exercise prescription and nutrition guidelines.;Long Term: Cholesterol controlled with medications as prescribed, with individualized exercise RX and with personalized nutrition plan. Value goals: LDL < 70mg , HDL > 40 mg.  Education:Diabetes - Individual verbal and written instruction to review signs/symptoms of diabetes, desired ranges of glucose level fasting, after meals and with exercise. Acknowledge that pre and post exercise glucose checks will be done for 3 sessions at entry of program. Flowsheet Row Pulmonary Rehab from 11/16/2022 in [facility] Cardiac and Pulmonary Rehab  Date 11/16/22  Educator [facility]  Instruction Review Code 1- Verbalizes Understanding       Know Your Numbers and Heart Failure: - Group verbal and visual instruction to discuss disease risk factors for cardiac and pulmonary disease and treatment options.  Reviews associated critical values for Overweight/Obesity, Hypertension, Cholesterol, and Diabetes.  Discusses basics of heart failure: signs/symptoms and treatments.  Introduces Heart Failure Zone chart for action plan for heart failure.  Written material given at graduation.   Core Components/Risk Factors/Patient Goals Review:    Core Components/Risk Factors/Patient Goals at Discharge (Final Review):    ITP Comments:  ITP Comments     Row Name 11/09/22 1028 11/16/22 1639 11/19/22 1111 12/02/22 0900 12/09/22 1640   ITP Comments Virtual Visit completed. Patient informed on EP and RD appointment and 6 Minute walk test. Patient also informed of patient health questionnaires on My Chart. Patient Verbalizes understanding. Visit diagnosis can be found in [facility]  10/19/2022. Completed and gym orientation. Initial ITP created and sent for review to Dr. Jinny Sanders, Medical Director. First full day of exercise!  Patient was oriented  to gym and equipment including functions, settings, policies, and procedures.  Patient's individual exercise prescription and treatment plan were reviewed.  All starting workloads were established based on the results of the 6 minute walk test done at initial orientation visit.  The plan for exercise progression was also introduced and progression will be customized based on patient's performance and goals. 30 day review completed. ITP sent to Dr. Jinny Sanders, Medical Director of  Pulmonary Rehab. Continue with ITP unless changes are made by physician. Patient called and asked to be placed on a medical hold with pulmonary rehab as he has been experiencing BP and BG issues. He has already spoke to his doctor about it and are in the middle of changing some medications. Patient would like to also wait until after his MRI in the next couple weeks to see what his results are regarding his syncopal episodes. Will place patient on medical hold at this time.    Row Name 12/30/22 0826 12/30/22 1119 01/21/23 1434 01/27/23 0839 02/08/23 1623   ITP Comments 30 Day review completed. Medical Director ITP review done, changes made as directed, and signed approval by Medical Director.   last visit 4/17 medical issues Called patient to follow up from medical hold, patient states his doctor is still adjusting his medications for his blood sugar and BP. He completed his MRI but does not follow up with his PCP about it until 6/3. Patient would like to hold off on rehab until he speaks with his doctor at his follow up appt on 6/3. Did inform patient he will need clearance from his MD before returning to rehab. Patient verbalized understanding. Will continue patient on medical hold at this time. Called to check up on patient.  He has been on medical hold since 12/02/22 due to heart work up.  Left message.  Unable to assess for goals. 30 Day review completed. Medical Director ITP review done, changes made as directed, and signed  approval by Medical Director.   out since 4/17 medical Attempted to call pt to follow up. He  has been on medical hold since 12/02/22 due to cardiac work up. Unable to leave message. Voicemail is full.    Row Name 02/23/23 1415 03/01/23 1644         ITP Comments 30 Day review completed. Medical Director ITP review done, changes made as directed, and signed approval by Medical Director.   remains out for medical reason Spoke with pt. He has had continued medical issues requiring further work up at this time and unable to attend rehab. He is waiting on vascular referral in [place] for blood clots in leg. He follows up with PCP this Friday. Pt agreed to discharge from cardiac rehab at this time. Pt understands he may return when medically cleared with new referral.               Comments: Discharge ITP

## 2023-03-01 NOTE — Progress Notes (Signed)
Discharge Summary: Hector Hancock (DOB 11/05/1965)  Pt has had continued medical issues requiring further work up and unable to complete cardiac rehab at this time. He completed 4 out of 36 sessions. He is waiting on vascular referral in [place] for blood clots in leg. He follows up with PCP this Friday. Pt agreed to discharge from cardiac rehab at this time. Pt understands he may return when medically cleared with new referral.      6 Minute Walk     Row Name 11/16/22 1640         6 Minute Walk   Phase Initial     Distance 750 feet     Walk Time 4.5 minutes     # of Rest Breaks 2     MPH 1.89     METS 1.36     RPE 15     Perceived Dyspnea  3     VO2 Peak 4.76     Symptoms Yes (comment)     Comments SOB     Resting HR 87 bpm     Resting BP 102/62     Resting Oxygen Saturation  94 %     Exercise Oxygen Saturation  during 6 min walk 93 %     Max Ex. HR 112 bpm     Max Ex. BP 112/60     2 Minute Post BP 104/64       Interval HR   1 Minute HR 104     2 Minute HR 107     3 Minute HR 93  taken during rest break     4 Minute HR 103     5 Minute HR 111     6 Minute HR 112     2 Minute Post HR 95     Interval Heart Rate? Yes       Interval Oxygen   Interval Oxygen? Yes     Baseline Oxygen Saturation % 94 %     1 Minute Oxygen Saturation % 93 %     1 Minute Liters of Oxygen 0 L     2 Minute Oxygen Saturation % 94 %     2 Minute Liters of Oxygen 0 L     3 Minute Oxygen Saturation % 96 %     3 Minute Liters of Oxygen 0 L     4 Minute Oxygen Saturation % 96 %     4 Minute Liters of Oxygen 0 L     5 Minute Oxygen Saturation % 95 %     5 Minute Liters of Oxygen 0 L     6 Minute Oxygen Saturation % 95 %     6 Minute Liters of Oxygen 0 L     2 Minute Post Oxygen Saturation % 97 %     2 Minute Post Liters of Oxygen 0 L

## 2023-03-02 ENCOUNTER — Ambulatory Visit: Payer: BC Managed Care – PPO

## 2023-03-04 ENCOUNTER — Ambulatory Visit: Payer: BC Managed Care – PPO

## 2023-03-09 ENCOUNTER — Ambulatory Visit: Payer: BC Managed Care – PPO

## 2023-03-11 ENCOUNTER — Ambulatory Visit: Payer: BC Managed Care – PPO

## 2023-03-16 ENCOUNTER — Ambulatory Visit: Payer: BC Managed Care – PPO

## 2023-03-18 ENCOUNTER — Ambulatory Visit: Payer: BC Managed Care – PPO

## 2023-04-05 ENCOUNTER — Ambulatory Visit: Payer: BC Managed Care – PPO | Admitting: Internal Medicine

## 2023-04-05 NOTE — Progress Notes (Signed)
Pt did not show for scheduled appointment.  

## 2023-06-16 IMAGING — DX DG CHEST 2V
2 series · 2 of 2 positions shown · non-contrast
Comparison: Chest x-ray 06/18/2014.

CLINICAL DATA: 55-year-old male with history of shortness of breath
and chest tightness.

EXAM:
CHEST - 2 VIEW

[chest pa]
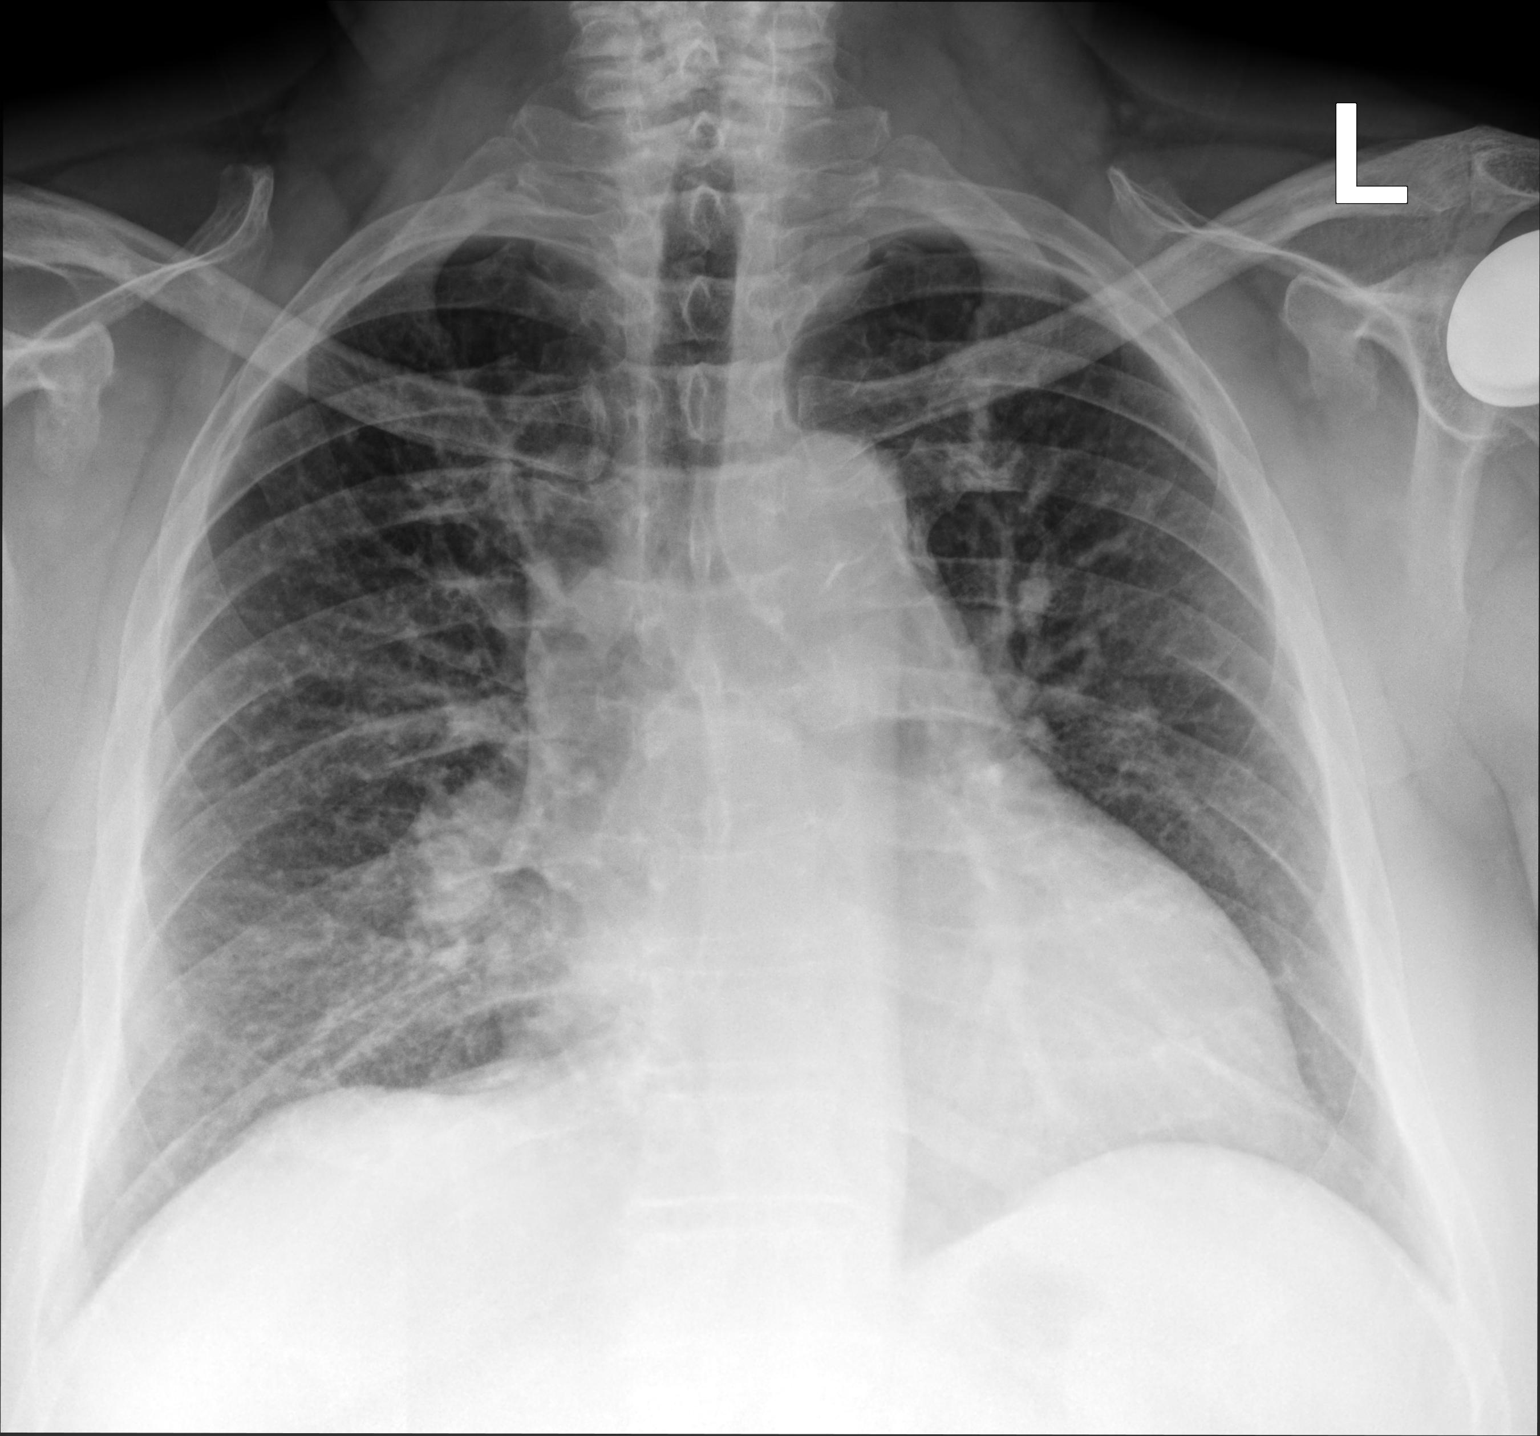

[chest lat]
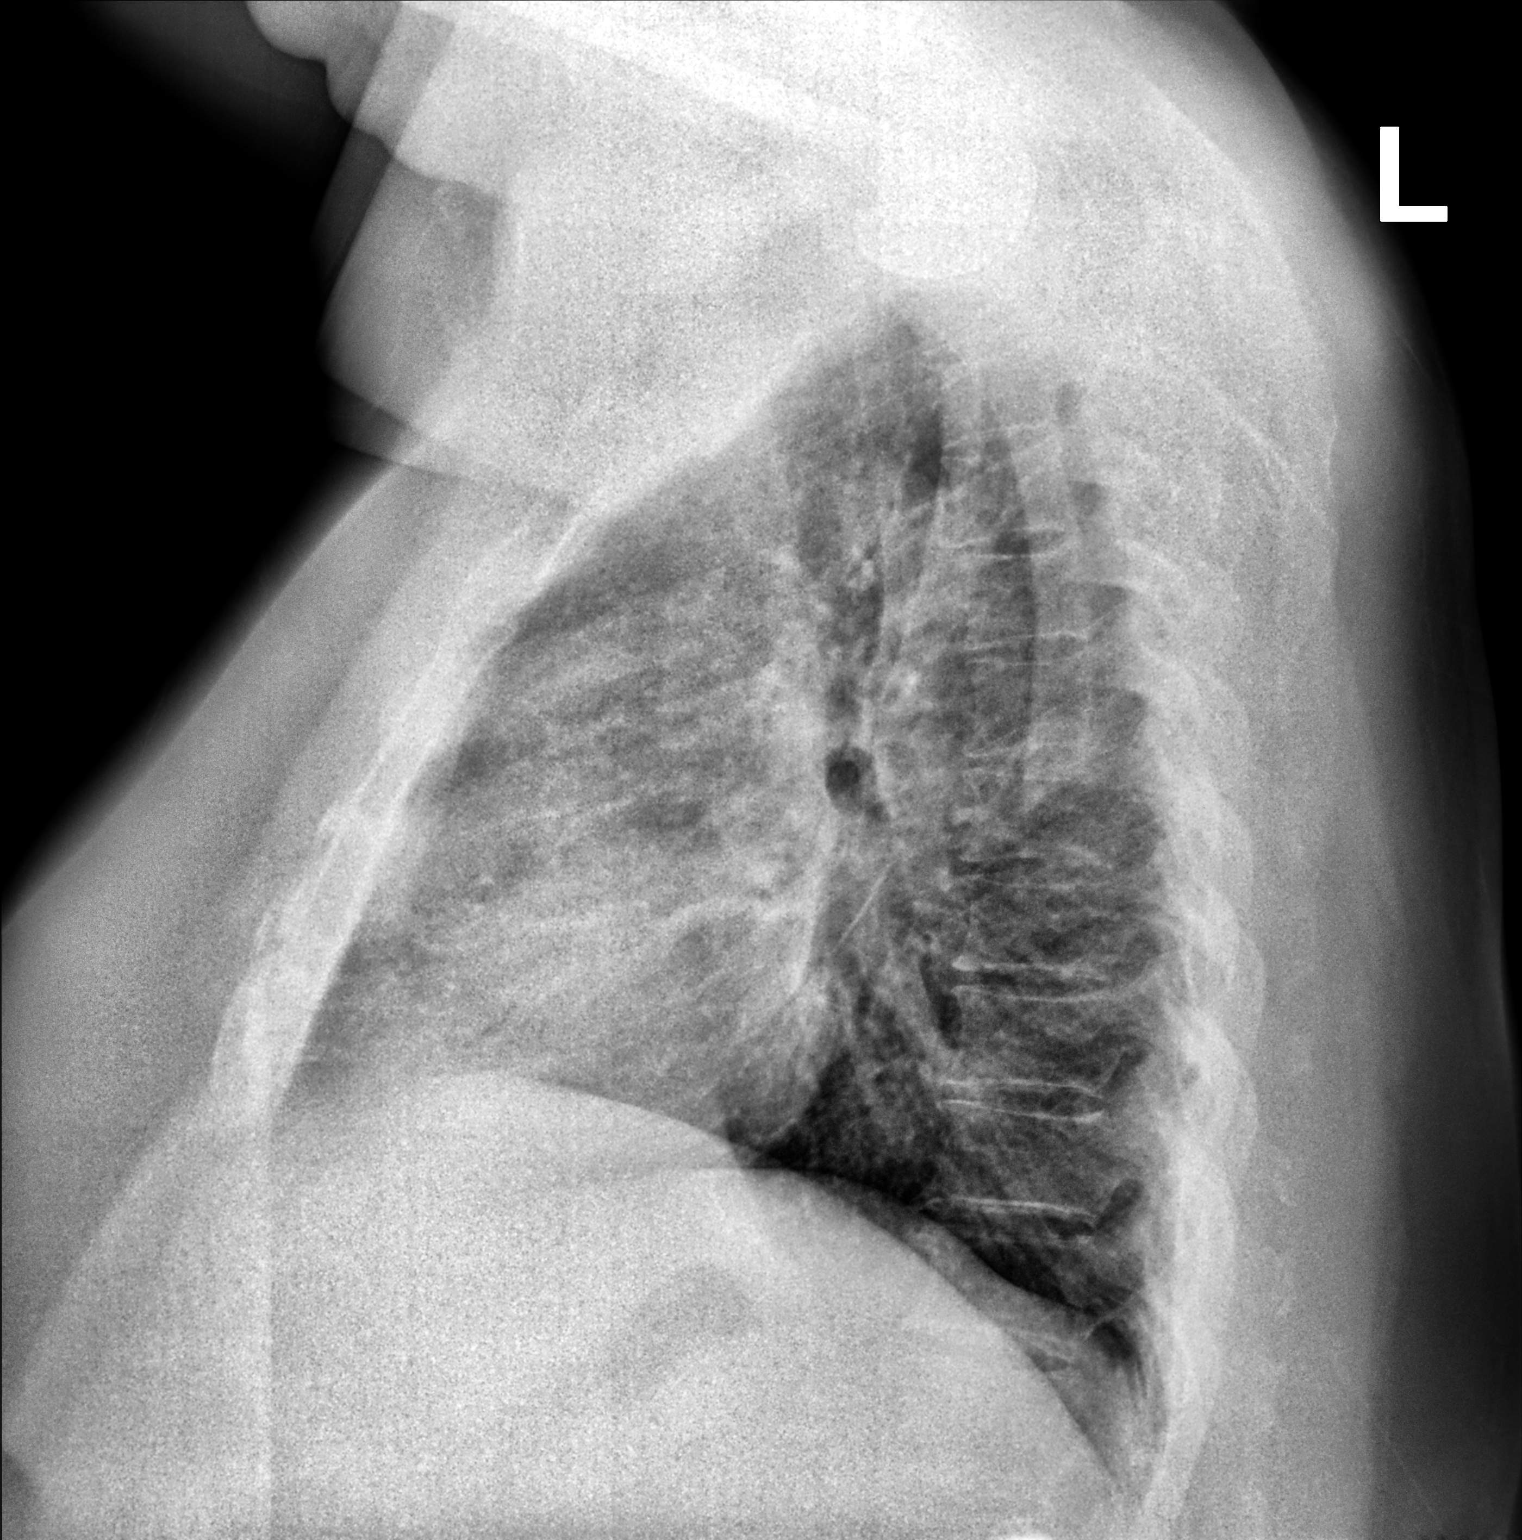

[2 of 2 positions shown; findings below may reference images not displayed]

FINDINGS: Diffuse peribronchial cuffing and patchy areas of interstitial
prominence, most evident throughout the right lung, particularly in
the right hilar and infrahilar region. No pleural effusions. No
pneumothorax. No evidence of pulmonary edema. Heart size is mildly
enlarged. Upper mediastinal contours are within normal limits.
Atherosclerotic calcifications in the thoracic aorta.
IMPRESSION: 1. The appearance the chest is concerning for bronchitis,
potentially with developing right-sided bronchopneumonia. Followup
PA and lateral chest X-ray is recommended in 3-4 weeks following
trial of antibiotic therapy to ensure resolution and exclude
underlying malignancy.
2. Mild cardiomegaly.
3. Aortic atherosclerosis.

## 2023-06-16 IMAGING — CR DG CHEST 2V
2 series · 2 of 2 positions shown · non-contrast
Comparison: 03/23/2021 a prior studies

CLINICAL DATA: Shortness of breath

EXAM:
CHEST - 2 VIEW

[chest pa]
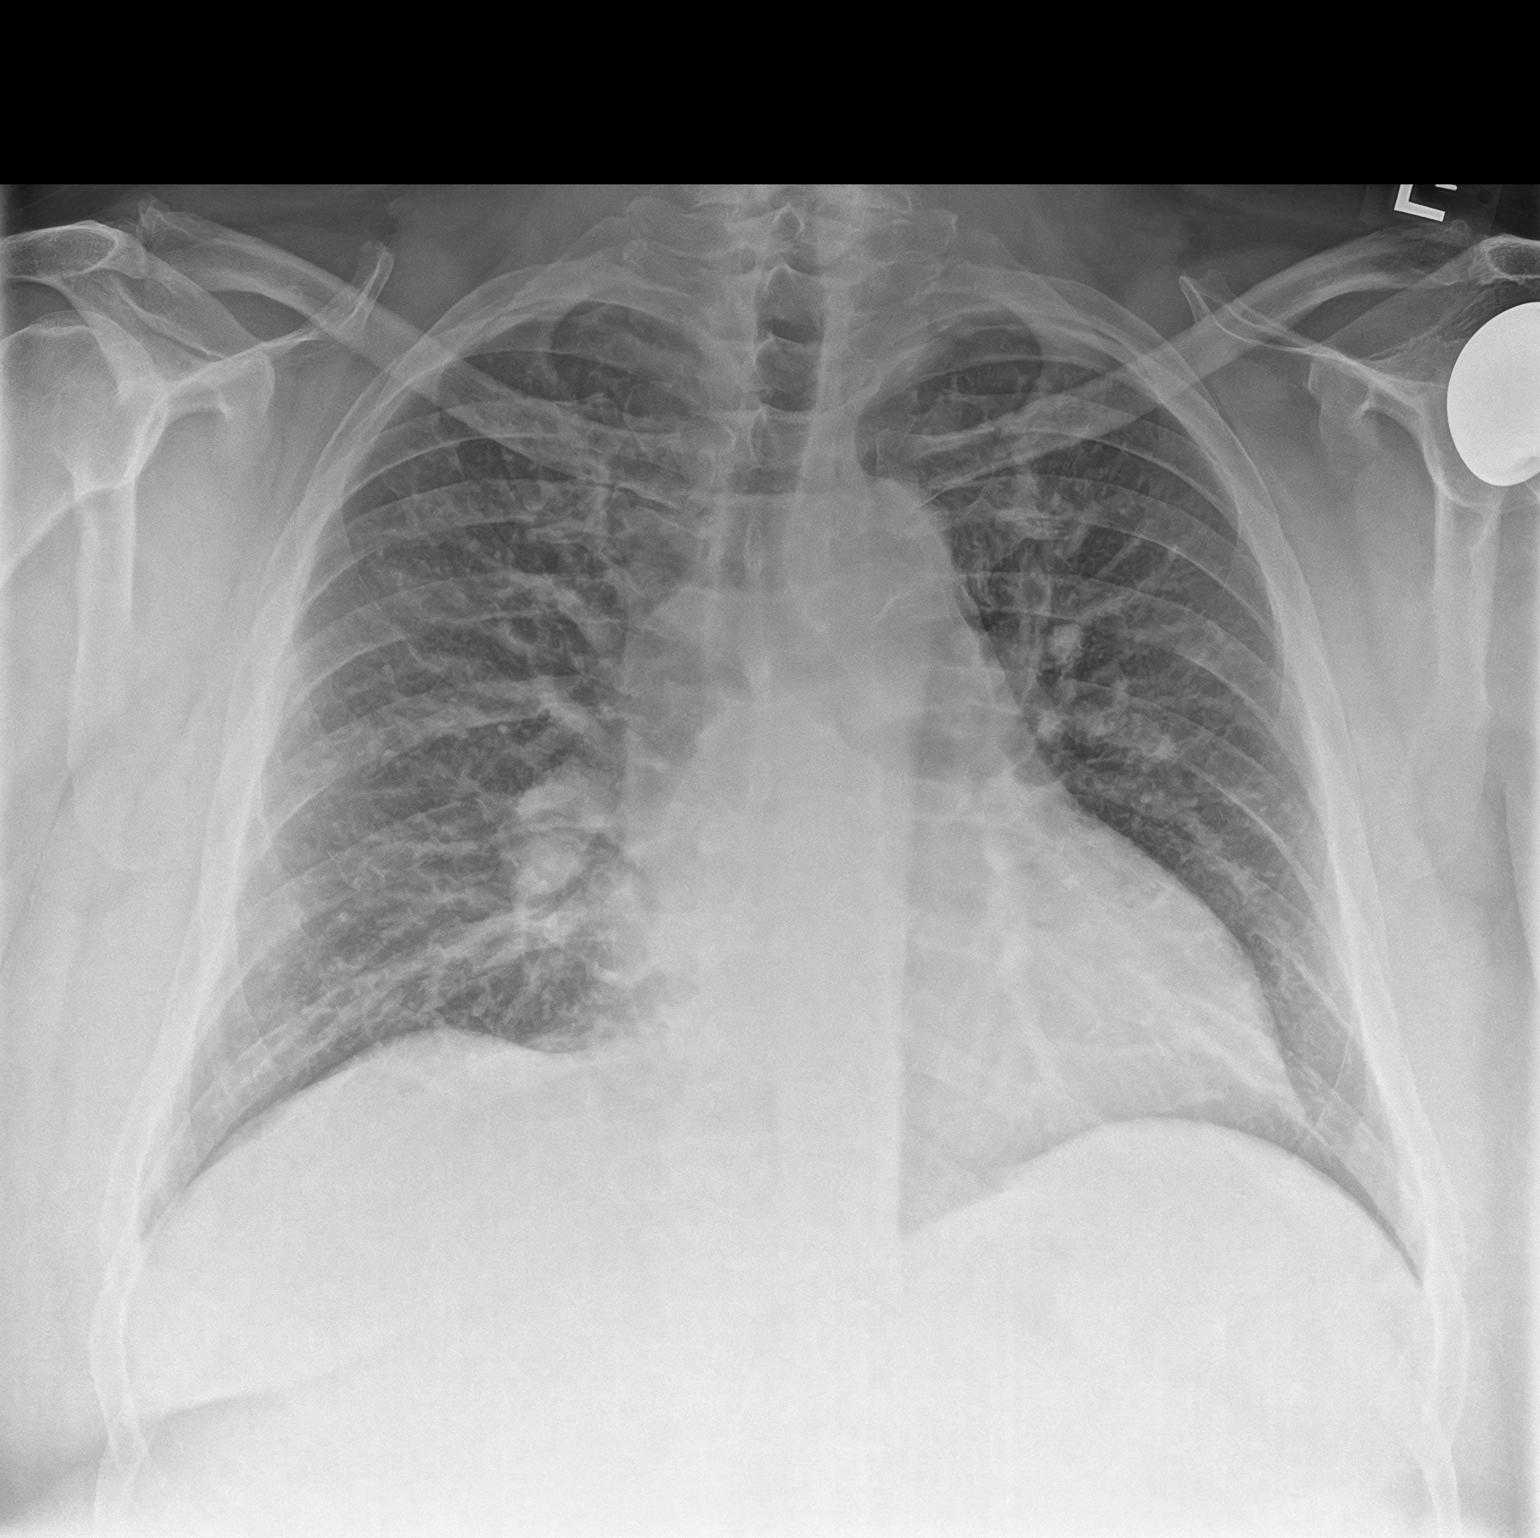

[chest lat]
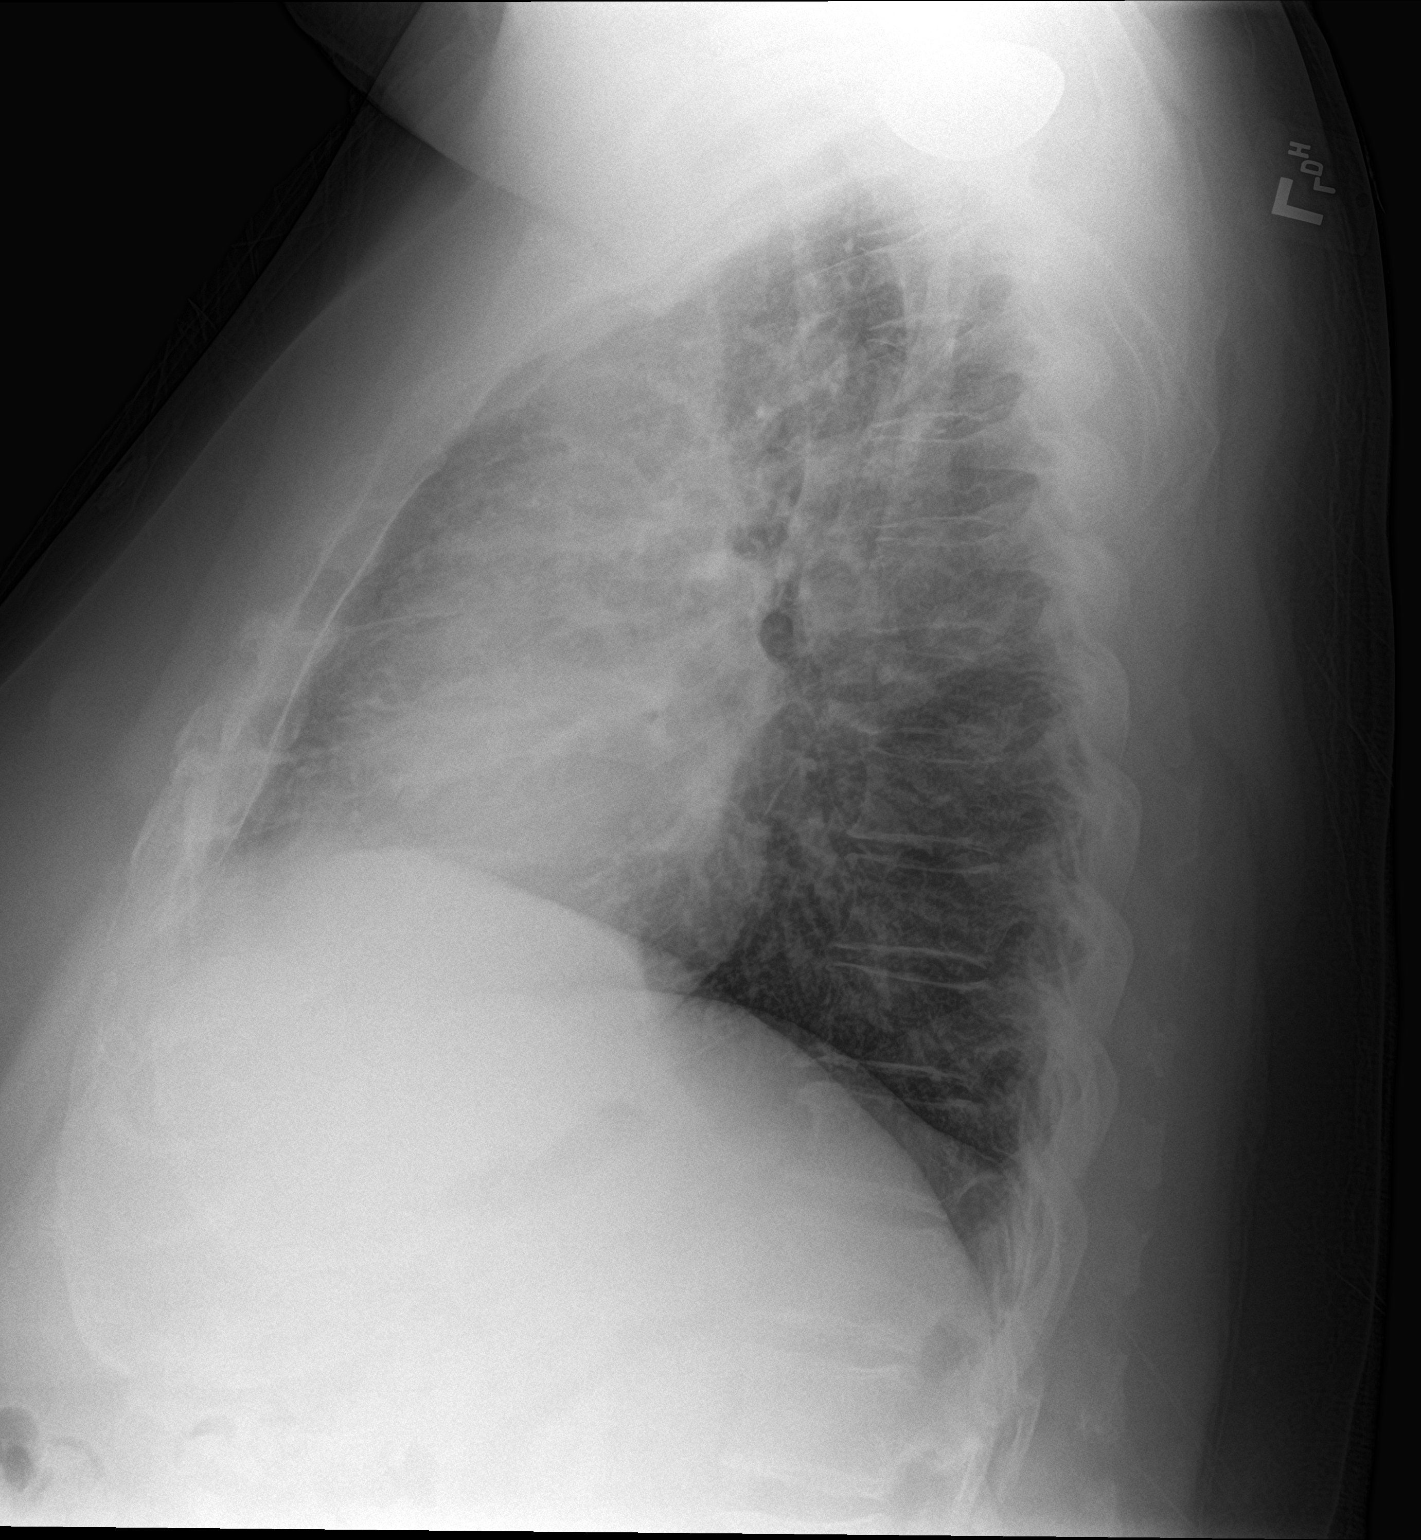

[2 of 2 positions shown; findings below may reference images not displayed]

FINDINGS: UPPER limits normal heart size and mild peribronchial thickening
again noted.

There is no evidence of focal airspace disease, pulmonary edema,
suspicious pulmonary nodule/mass, pleural effusion, or pneumothorax.

No acute bony abnormalities are identified.
IMPRESSION: Mild peribronchial thickening without evidence of focal pneumonia.

## 2023-07-18 IMAGING — US US EXTREM LOW VENOUS*L*
1 series · 13 of 24 positions shown · non-contrast
Comparison: None.

CLINICAL DATA: 1X5.NNL and M79.605,



[Series 1: us venous img lower uni left (dvt) · portal-venous · 13 of 35 slices shown]
[im 1/35]
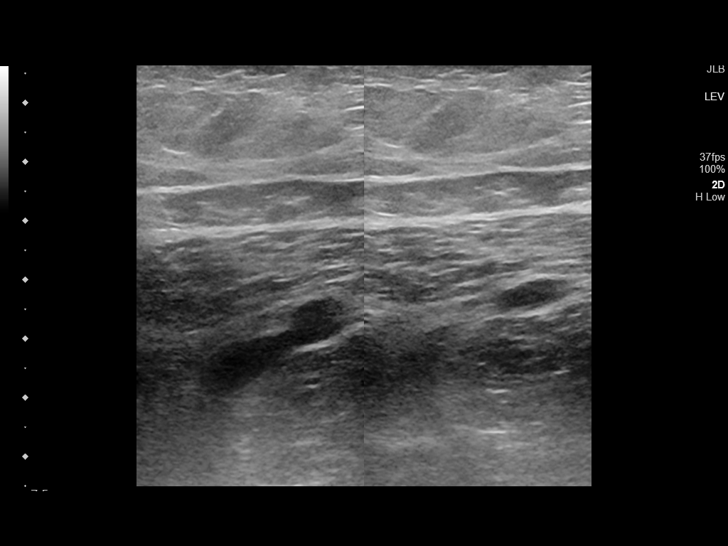
[im 3/35]
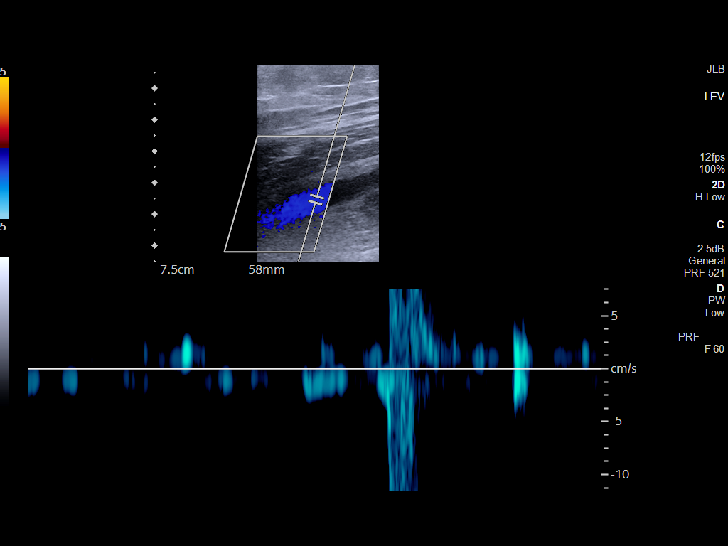
[im 6/35]
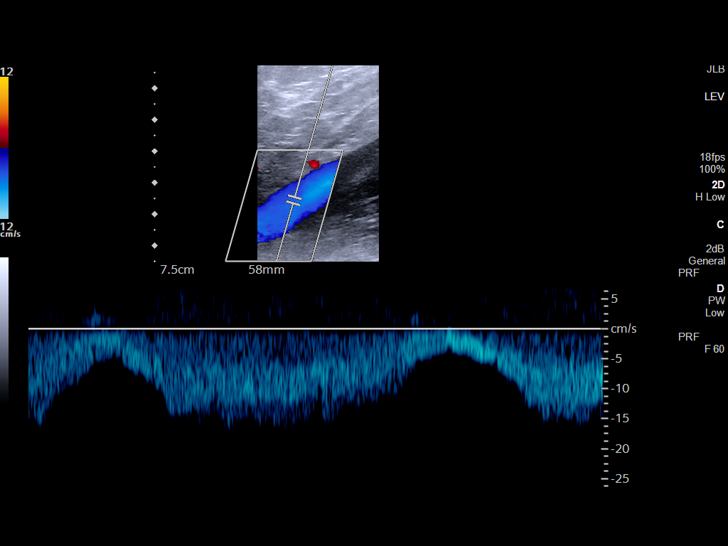
[im 9/35]
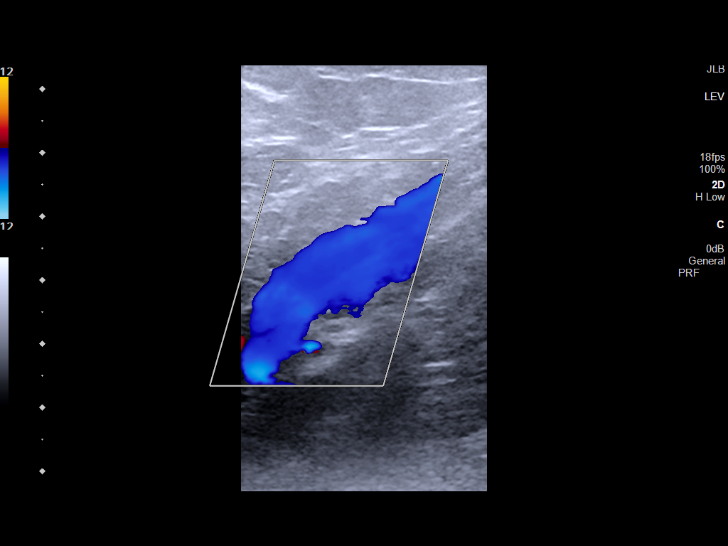
[im 12/35]
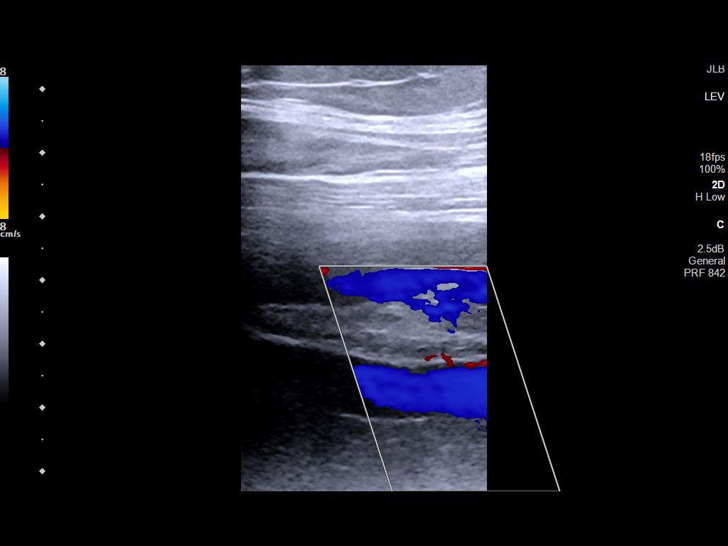
[im 15/35]
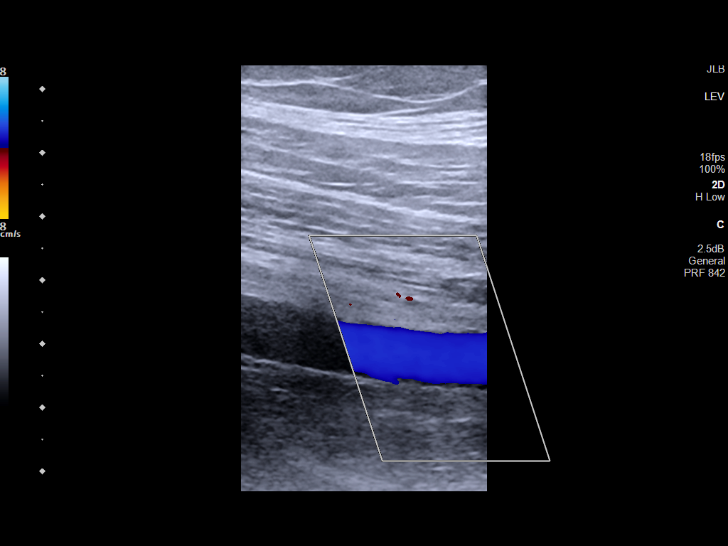
[im 18/35]
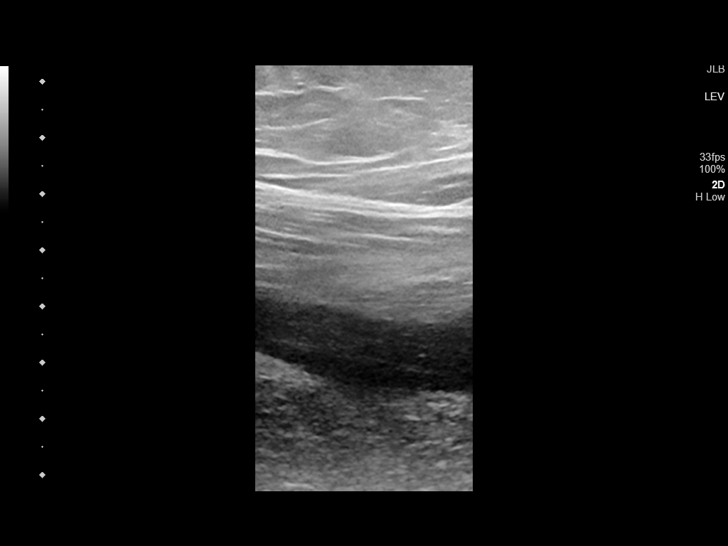
[im 20/35]
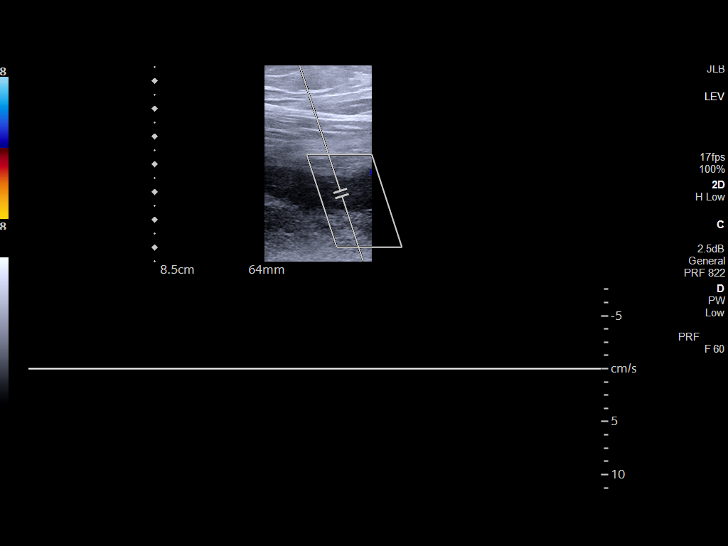
[im 23/35]
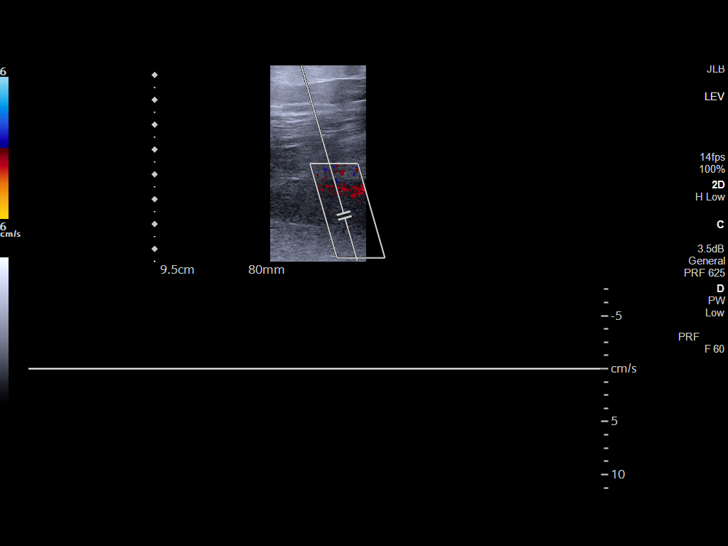
[im 26/35]
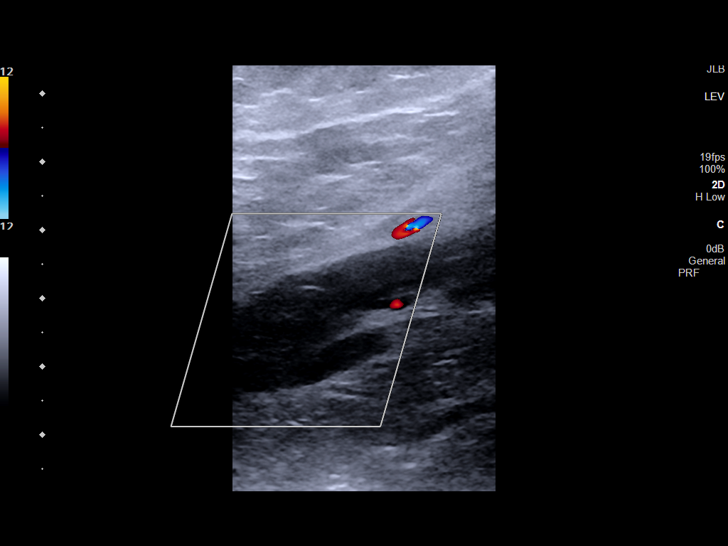
[im 29/35]
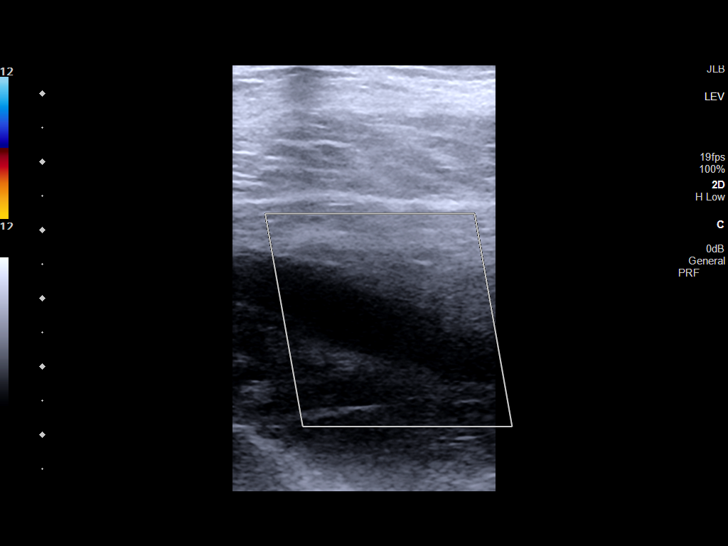
[im 32/35]
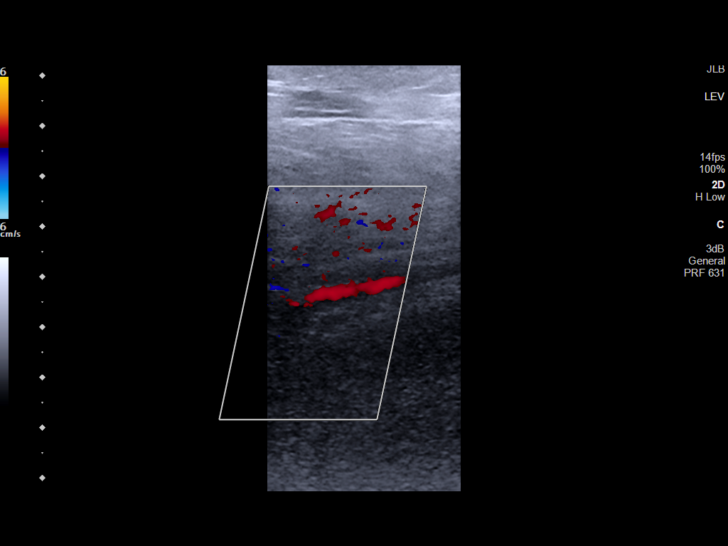
[im 35/35]
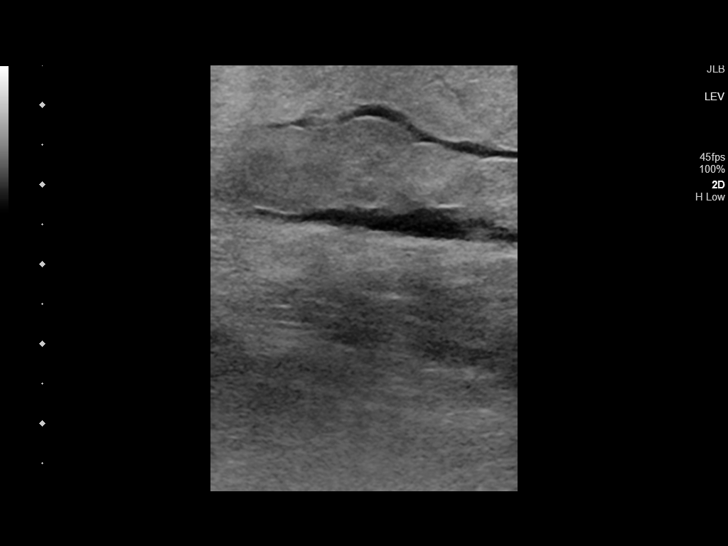

[13 of 24 positions shown; findings below may reference images not displayed]

FINDINGS: Contralateral Common Femoral Vein: Respiratory phasicity is normal
and symmetric with the symptomatic side. No evidence of thrombus.
Normal compressibility.

Common Femoral Vein: No evidence of thrombus. Normal
compressibility, respiratory phasicity and response to augmentation.

Saphenofemoral Junction: No evidence of thrombus. Normal
compressibility and flow on color Doppler imaging.

Profunda Femoral Vein: No evidence of thrombus. Normal
compressibility and flow on color Doppler imaging.

Femoral Vein: The proximal femoral vein is patent. There is
expansile hypoechoic occlusive thrombus involving the mid and distal
femoral vein.

Popliteal Vein: Expansile hypoechoic occlusive thrombus.

Calf Veins: Visualized calf veins demonstrate poor compressibility
consistent with thrombus.

Other Findings:  Calf edema.
IMPRESSION: There is acute-appearing occlusive DVT involving the left lower
extremity deep veins extending up to the mid left femoral vein.

These results will be called to the ordering clinician or
representative by the Radiologist Assistant, and communication
documented in the PACS or [REDACTED].

## 2024-02-24 NOTE — Progress Notes (Signed)
 NEUROLOGY REPORT  Prior visit with this Neurologist:  07/20/2023    Assessment & Plan   ICD-10-CM  1. Leg pain, bilateral  M79.604   M79.605  2. Lymphedema of left lower extremity  I89.0  3. Sarcoidosis  D86.9    Hector Hancock is a 58 y.o. male who presents for re-evaluation of Left Leg Pain and Swelling Secondary to Deep Vein Thrombosis (DVT). The pain is limiting his ability to function and is unable to work. This transition has been very challenging.   Elementary neurological examination is significant for slightly improved left > right edema and hyperalgesia.  He has reduced movement of the toes on the left foot which occurred following his second DVT.  I have reviewed the history, clinical findings, differential diagnosis and answered questions regarding diagnosis to the best of my ability and to the patients stated satisfaction. It is more likely than not, that his symptoms will continue to impact his ability to move, walk and function as he did prior to his DVT diagnosis, given the severity of pain, weakness and sensory loss in the legs, despite improvement in swelling. He previously had to walk/stand and sit for work and it is unlikely that he is able to resume his usual duties at this time. Prognosis remains guarded, for improvement, given his ongoing and severe co-morbid conditions, including lymphedema, diabetes, bilateral Pulmonary emboli, CHF, Sarcoidosis, and elevated BMI. He is at very high risk for falls and would be reasonable to consider mobility aids long-term.   After shared decision making, the following are recommended:   We discussed the importance of treating his pain including compression stockings,(need for a Donner).  Medication:  Informed about Lyrica side effects, including weight gain and leg swelling, and the need to monitor liver function.  Consider Cymbalta- added 30 mg daily. Monitor for efficacy and consider wean off Lyrica.  There is a high risk for  medication side effects, which will require ongoing monitoring and dose adjustment. Medication side effect profile was reviewed in detail with patient. Medication safety and drug interactions reviewed on Erx.  He may benefit from referral to Pain Management.  Other issues:  Sarcoidosis - Recently diagnosed with lung involvement. + Family history of sarcoidosis. No evidence of brain involvement based on MRI, done with and without contrast  Syncope - Previously had EEG monitoring for 24 hours with 3 episodes captured that was not epileptiform.  Return for Care is transferred back to PCP.SABRA      Subjective   The history is obtained from the patient. Additional history is obtained from review of available medical records. Open-ended and close-ended questions, as well as review of EPIC chart, including CareEverywhere if available.The patient is unaccompanied.    History of Present Illness  Hector Hancock is a 58 y.o. 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 Please see prior notes for details of history, not repeated here for brevity's sake and reviewed prior to the visit.   He continues to have severe leg pain that impacts all ADLS, despite improved edema and CHF symptoms.  He is unable to return to work due to all his symptoms and is contemplating applying for disability.   FAMILY HISTORY: sarcoid. No Seizures.  CHILDHOOD HISTORY: + seizures as a child (none since mid-20's). Recalls taking dilantin. Started age 61.   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  Objective GENERAL PHYSICAL EXAM:   Vital Signs: Reviewed today.  Vitals:   02/29/24 1442  BP: 130/84  BP Site: L Arm  BP Position: Sitting  BP Cuff Size: Large  Pulse: 88  Resp: 20  Temp: 36.5 C (97.7 F)  TempSrc: Temporal  SpO2: 97%  Weight: (!) 160.1 kg (353 lb)   Height: 182.9 cm (6')     General Appearance: The patient was well-groomed and neat, engaged and cooperative.  Significant edema bilaterally, worse on the left.   Neurological Exam Mental Status Awake and alert. Oriented only to person, place, time and situation. Speech is normal. Language is fluent with no aphasia. Attention and concentration are normal.  Cranial Nerves CN VII: Full and symmetric facial movement. CN VIII: Hearing is normal.  Motor Normal muscle bulk throughout. Normal muscle tone. No abnormal involuntary movements. Weakness of the left EHL and unable to move his toes on the left. .  Sensory Reduced sensation left leg - worse on lateral aspect of the left foot. Also reduced on the right to a lesser extent. Skin changes worse on the left (dark, dry skin) from ~12 cm below the knee. .  Reflexes                                           Right                      Left Patellar                                1+                         1+ Achilles                                0                         0  Gait  Unable to rise from chair without using arms. He is in a scooter today for mobility. Unable to stand without significant pain. He is limited in his ability to walk but for a few feet, before significant pain sets in. .   Medical Decision Making and Data review:  I have independently reviewed the Medical record, including Nursing notes, social determinants of health, prior available records (CareEverywhere where available/appropriate). I have obtained and reviewed the history, performed an appropriate examination. 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  It was recommended that he have a  sciatic MRI.  He had sonographic evaluation of the left lower extremity by Dr. 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 Jobe after MRI was scheduled.    I have reviewed the diagnosis, clinical findings and plan of care in layman's terms, ensuring that the patient has had an  opportunity to address any/all questions and/or concerns to their reported satisfaction. Counseling provided included: discussing my clinical reasoning and clinical impression, reviewing a differential diagnosis and a review of my recommendations for a plan of treatment as documented above. In addition, time was provided to address alternative options for care, health literacy regarding the differential diagnosis, testing and medication options, the benefits of current plan. A brief written summary was provided to the patient at the time of the visit, as well as instructions on how to access My Chart. My contact information was provided along with instructions on how to reach us . The patient was also instructed to contact the referring provider, PCP or our team, for any worsening or changing symptoms.  In addition to Medical Decision Making, the following is noted I personally spent 20 minutes face-to-face and non-face-to-face in the care of this patient, which includes all pre, intra, and post visit time on the date of service.  All documented time was specific to the E/M visit and does not include any procedures that may have been performed.  The extensive data required for review today required additional time to ensure that neurological issues were addressed fully.   Dr. Shasta JAYSON KANDICE Hezzie, FAAN Board Certified: Adult Neurology  Board Certified: Vascular Neurology [facility] Division of General Medicine and Clinical Epidemiology Department of Internal  Medicine Department of Neurology [facility] [place], [place]  CC: Jama Mayo, MD Dictated using voice-activated software. Despite editing, typographical errors may exist.

## 2024-03-05 NOTE — Progress Notes (Signed)
 Pulmonary Clinic - Follow-up Visit  HISTORY:   Active Pulmonary Problems & Brief History: Hector Hancock is a 58 y.o. 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 He is a never smoker.   His DOE worsened despite losing weight on Ozempic. He weighs himself daily and will double his torsemide  dose if his weight increases.  He reports dyspnea on exertion for the past several years that has been progressive.  He denies prior symptomatic benefit with albuterol .  His dyspnea has improved with diuresis.  He was admitted to the hospital 08/2022 for leg pain. He was found to have LLE DVT with progression despite Eliquis. CTA without PE. He was transitioned to Xarelto . He has hematology follow up scheduled. He was felt to be volume overloaded and received IV diuretics during the admission.  He presented to the ED 09/30/2022 for chest pain and dyspnea. CTA without PE again. He was discharged home with Cardiology follow up. He saw Cardiology 10/05/2022. At that time, DOE was attributed to resolving PE, acute on chronic HFpEF, obesity-hypoventilation syndrome, deconditioning, severe sleep apnea. Right heart cath noted mildly elevated pressures, index low, consistent with right heart failure. He had unrevealing echo and PET stress test during an admission 05/2021, which was negative.  He saw cardiology in clinic 10/26/2022.  Symptoms were felt to be related to chronic HFpEF, resolving PE, OHS, physical deconditioning, and severe untreated sleep apnea.  He appeared volume overloaded at that time.  He [facility] to [facility]2022 with hypertensive urgency, headaches, vision changes, syncope without prodrome, and dyspnea associated with palpitations.  This was in the setting of increasing  enalapril  2 weeks prior.  He presented to the ED 12/24/2022 with left-sided chest pain radiating to the left shoulder that woke him from sleep, as well as syncope.  CTA negative for PE.  PVL showed chronic obstruction in the left leg.  He was then discharged with plans for outpatient follow-up.  He has had a normal MRI brain.  He has been dealing with ongoing syncope leading him to seek disability.   He has exercise intolerance and backslides if he overexerts himself. He has had COVID twice.   He has OSA and wears BiPAP with a nasal mask, but he only wears it 2 nights per week because he feels like it is suffocating him.   Interval History 08/25/23: Initial visit took place 07/23/2021. Patient was last seen in pulmonary clinic 05/19/2023. In the interim, he had EBUS consistent with sarcoidosis. Syncope is somewhat improved but DOE continues and is severe. He has had side effects on prednisone previously including psychosis, thoughts of hurting others. Will evaluate for cardiac sarcoidosis with cardiac MRI and attempt to treat symptoms with Symbicort (he is already on Plaquenil ). May consider starting with steroid-sparing agent if needing additional systemic treatment for symptoms. Cardiac MRI has been scheduled 09/15/2023.  History of Present Illness: Patient reports that he is feeling persistent breathing issues. Reports that he feels somewhat worse over the past 6 months. Walking distance from waiting room to the treatment room causes dyspnea. Currently takes symbicort as needed. 3-4x/week. Worsening leg swelling to left leg though improves after laying down. Reports he is regularly wearing PAP when sleeping. Currently taking Torsemide  60mg  every day and follows with the diuresis clinic. Went to the ER for dyspnea in 03/25.  Repeat CTA chest with no PE. Has a zio patch for evaluation of heart fluttering. No orthopnea. No additional ER visits, no hospitalizations.   Past Medical History: The medical and  surgical history were personally reviewed and updated in the patient's electronic medical record. 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  Pertinent positives include:  Xarelto  Torsemide  Spironolactone  Ozempic Plaquenil  (for hand arthritis) Symbicort  Discontinued:  Torsemide  Jardiance  Eliquis  Allergies: Allergies were reviewed and updated in the patient's electronic medical record.  Family History: Family history was reviewed and updated in the patient's electronic medical record.  Pertinent positives include:  Mother - sarcoidosis, onset 3s-60s, diabetes Sister - diabetes Father - heart disease  Social History: Living Situation: Lives with wife and mother in social [place]. Has a bulldog.  Tobacco: Never. Some secondhand smoke exposure (wife smokes). Alcohol: None. Drugs: Never. 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 Currently out of work on disability related to syncope. Hobbies: Gambling at Casinos, limited by dyspnea.   Review of Systems: A comprehensive review of systems was completed and negative except as noted in HPI.  PHYSICAL EXAM:   Vitals:   03/06/24 1117  BP: 106/65  Pulse: 80  Temp: 36.1 C (96.9 F)  SpO2: 96%    Wt Readings from Last 6 Encounters:  03/06/24 (!) 164 kg (361 lb 9.6 oz)  02/29/24 (!) 160.1 kg (353 lb)  02/28/24 (!) 163.3 kg (360 lb)  02/04/24 (!) 164.1 kg (361 lb 11.2 oz)  01/31/24 (!) 162.4 kg (358 lb 1.6 oz)  01/27/24 (!) 163.1 kg (359 lb 9.6 oz)     Gen: Alert, awake. Well-appearing. NAD. HEENT: NCAT. Sclerae anicteric. MMM. Neck: Supple. Trachea midline. Pulm: CTAB.  No crackles, wheezes, or rhonchi. Normal WOB on RA. CV: RRR, no MRG. Abd: Soft, nondistended, non-tender Ext: Well-perfused. 0 to 1+ bilateral pitting edema though wearing compression stockings, L>R. No clubbing. Skin: Warm, dry, intact. No rashes, wounds, or cyanosis. Neuro: Alert, awake. Normal speech & language. Cranial nerves II-XII grossly intact.  LABORATORY and RADIOLOGY DATA:   Records were reviewed within our EMR, CareEverywhere, and the media tab.   Pulmonary Function Tests/Interpretation: Spirometry 08/31/2023   Lung volumes 12/22/2022   Diffusion studies 08/31/2023   Muscle pressure testing 11/27/2022   Previous:  07/2021: No evidence of obstructive ventilatory defect on the expiratory flow volume curve. Normal FEV1/FVC with reduced FVC is suggestive of restrictive ventilatory defect.Recommend lung volumes to fully evaluate restrictive ventilatory defect.Inspiratory limb of Flow-volume loop demonstrates normal forced inspiratory flows. DLCO is normal (with a low VA and very high KCO). Findings could suggest related to high BMI/obesity or extraparenchymal causes. Lung volumes would assist in diagnosis.  : 11/17/2021: Total 6 minute walk distance was 237m, which is 47.5% predicted. Oxygen was not required at rest or with exertion and O2 saturation did not fall below 97% for the entirety of the test. Patient had several pauses and became quite short of breath w/ tachypnea during testing.  Pertinent Laboratory Data: Lab Results  Component Value Date   WBC 6.2 01/21/2024   HGB 15.2 01/21/2024   HCT 45.1 01/21/2024   PLT 183 01/21/2024    Lab Results  Component Value Date   NA 140 02/04/2024  K 4.7 02/04/2024   CL 104 02/04/2024   CO2 27.4 02/04/2024   BUN 15 02/04/2024   CREATININE 0.83 02/04/2024   GLU 296 (H) 02/04/2024   CALCIUM 9.3 02/04/2024   MG 2.0 01/21/2024   PHOS 3.9 05/29/2021    Lab Results  Component Value Date   BILITOT 0.7 01/21/2024   PROT  6.9 01/21/2024   ALBUMIN  3.7 01/21/2024   ALT 33 01/21/2024   AST 25 01/21/2024   ALKPHOS 68 01/21/2024    Lab Results  Component Value Date   PT 11.5 09/30/2022   INR 1.03 09/30/2022   APTT 35.4 09/30/2022    Lab Results  Component Value Date   EOSABS 0.0 11/03/2023     Pertinent Imaging Data: CTA chest 03/30/2023 No pulmonary embolism. *  Markedly increased mediastinal and hilar lymphadenopathy is indeterminate and could be reactive or neoplastic in etiology, including lymphoma/hematologic malignancy and metastatic disease. Consider obtaining tissue diagnosis. *  Unchanged scattered nonspecific subcentimeter nodules measuring up to 6 mm. Attention can be directed to these nodules on next follow-up.  CTA chest 12/24/2022 FINDINGS: PULMONARY ARTERIES: No emboli in either lung.  The right heart is not dilated. LUNGS AND AIRWAYS: The lungs are clear.. The central airways are patent. PLEURA: No pleural fluid or pneumothorax. MEDIASTINUM AND LYMPH NODES: No enlarged intrathoracic or axillary lymph nodes are present.    No other mediastinal abnormalities are present. HEART AND VASCULATURE:  Cardiac chambers normal in size. Ascending and descending aorta normal in caliber.    There is no pericardial effusion. BONES AND SOFT TISSUES: No significant abnormalities. UPPER ABDOMEN:  Negative. Impression No emboli and no acute findings.  09/15/23 Cardiac MRI:  Impressions: - Normal left ventricle size, mildly increased wall thickness, and normal systolic function; LVEF = 61% - Normal right ventricular systolic function - Technically limited gadolinium imaging. There was no evidence of late gadolinium enhancement to suggest any significant myocardial scar or infiltration.    Pertinent Cardiac Data: ZioPatch 07/2023 Patient had a min HR of 54 bpm, max HR of 146 bpm, and avg HR of 90 bpm. Predominant underlying rhythm was Sinus Rhythm. Isolated SVEs were rare (<1.0%), SVE Couplets were rare  (<1.0%), and no SVE Triplets were present. Isolated VEs were rare (<1.0%), and no VE Couplets or VE Triplets were present. Ventricular Bigeminy was present.   Symptoms were associated with sinus rhythm and on other occasions with isolated SVEs or isolated VEs.   ZioPatch 12/01/2022 Patient had a min HR of 56 bpm, max HR of 158 bpm, and avg HR of 88 bpm. Predominant underlying rhythm was Sinus Rhythm. 2 Supraventricular Tachycardia runs occurred, the run with the fastest interval lasting 4 beats with a max rate of 158 bpm, the longest lasting 8 beats with an avg rate of 125 bpm. Isolated SVEs were rare (<1.0%), SVE Couplets were rare (<1.0%), and SVE Triplets were rare (<1.0%). Isolated VEs were rare (<1.0%), VE Couplets were rare (<1.0%), and no VE Triplets were present. Patient symptoms were associated with sinus rhythm and on occasions with SVEs.   RHC 09/22/2022 Mildly elevated filling pressures: (all reported at end expiration) RA (a/v/m) 8/13/9  RV 27/4 (RVEDP 10) PA 26/14 (m=18) PCW (a/v/m) 16/18/14 Thermal Cardiac Output/Cardiac Index 5.36/1.91 l/min/m2 Fick Cardiac Output/Cardiac Index 6.05/2.21 l/min/m2, PA sat 69%. BP 122/77, HR 84  Echo 09/22/2022 1. Technically difficult study. 2. The left ventricular systolic function is normal, LVEF is visually estimated at 55-60%. 3. The aortic valve is trileaflet  with mildly thickened leaflets with normal excursion. 4. The right ventricle is mildly to moderately dilated in size, with normal systolic function. 5. The right atrium is mildly to moderately dilated in size.  Pertinent Sleep Data: Polysomnogram 04/01/2021 (Media tab; from [facility]) Very severe obstructive sleep apnea with AHI 101.  Events were associated with peripheral oxygen desaturations to 83%.  More desaturations occurred during REM with lowest 59%.  ASSESSMENT and PLAN   Problem List: Patient Active Problem List  Diagnosis  . Bilateral pulmonary embolism     . Pain and  swelling of left lower extremity  . Chest pain  . Heart failure with preserved ejection fraction     . 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 Calcium pyrophosphate deposition disease  . Hypertension  . Class 3 severe obesity due to excess calories with serious comorbidity and body mass index (BMI) of 45.0 to 49.9 in adult (CMS-HCC)  . Type 2 diabetes mellitus without complication, without long-term current use of insulin      . Restrictive lung disease secondary to obesity  . Dyspnea on exertion  . Left leg swelling  . Obstructive sleep apnea  . Obesity hypoventilation syndrome (CMS-HCC)  . Syncope  . Sural neuropathy, left  . Lymphedema [I89.0]  . Venous stasis  . Tarsal tunnel syndrome of left side  . Pulmonary sarcoidosis (HHS-HCC)  . Risk for falls    Assessment & Plan:  Hector Hancock is a 58 y.o. 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 He is a never smoker.   Assessment & Plan Pulmonary sarcoidosis (HHS-HCC) Sarcoidosis diagnosis: Mediastinal/hilar LAD noted 03/2023. Diagnosis made 07/02/2023 by EBUS-guided lymph node biopsy with non-necrotizing granulomatous inflammation (CD4:8 2.7; sIL2 558; ACE 8).    Sarcoidosis treatment history: No prior treatment for sarcoid, but has been on plaquenil  for inflammatory arthritis for years   Organ involvement: Pulmonary: stage 1 involvement, PFTs with restrictive pattern (no obstruction; normal DLCO) Musculoskeletal: possible; unspecified inflammatory arthritis Ocular: last ophthalmology exam at [facility] 2021 normal; transitioned to local ophthalmologist [facility]) ~12/2022; reports attending ophthalmology follow-up this year Cardiac: syncope, no heart block on EKG 03/2023; TTE 09/2022 reassuring; ZioPatch 11/2022 and  07/2023 reassuring; EKG in 03/25 with NSR and normal intervals. Cardiac MRI not suggestive of cardiac sarcoid Neuro: NA, prior normal MRI brain Cutaneous: NA Hepatic: NA, LFTs normal 05/2023 Constitutional: fatigue Hypercalcemia: NA, Ca normal 05/2023  - Patient expressing improved symptoms control. Recommended Symbicort BID + PRN as patient endorsing improvement in symptoms with use - Will repeat spirometry and FVL. If patient developed signs of obstruction on PFTs, will consider initiation of methotrexate Dyspnea on exertion Spirometry normal; lung volumes with reduced TLC and disproportionately reduced ERV, consistent with restriction due to obesity; DLCO normal; muscle pressure testing consistent with fatigability.  Most likely multifactorial due to physical deconditioning/exercise intolerance/possible neuromuscular weakness (based on history and MIPs/MEPs; possibly related to COVID) + obesity (based on lung volumes) + very severe undertreated OSA/OHS (intolerant of BiPAP) +/- recurrent DVT on Xarelto  (CTA without PE; no prior PAgram, though TTE and RHC ruled out PHTN with mPAP <20) +/- HFpEF with pulmonary edema (though normal DLCO and lack of improvement with diuretics argue against this). Pulmonary sarcoidosis previously though to be less likely to be contributing given normal FVL, normal DLCO, and  normal appearing parenchyma on CT (despite perceived benefit with Symbicort) though re-evaluating PFTs  TTE with RV dilation but RHC without evidence of PHTN. EKG without evidence of prior RV infarct. Previous stress test and ZioPatch results reassuring; cardiac MRI not suggestive of cardiac sarcoidosis. previously reassuring against exertional hypoxia. No evidence of ILD on CT.   Per previous hematology evaluation, CTEPH not likely a contributing factor given previous CTAs and PET scans did not reveal evidence of chronic cough.  - Will change Symbicort to BID + PRN dosing given worsened  symptoms - Re-evaluate PFTs to assess for signs of obstruction. Will consider methotrexate if signs of obstruction on PFTs - Previously referred to bariatric surgery as below; on Ozempic - Has upcoming appointment with [facility] in [place] to adjust BiPAP settings; encouraged him to use BiPAP as much as possible in the meantime - Xarelto  per Hematology Obstructive sleep apnea Very severe (AHI >100) with desats to 50s.  - Reports that he is wearing PAP therapy at night time - Referred to bariatric surgery as below; on Ozempic Obesity, unspecified class, unspecified obesity type, unspecified whether serious comorbidity present - Suspecting that weight is contributing to patient's symptomatology - Ozempic previously discontinued due to symptoms of syncope and pre-syncope as well as hypoglycemia. His PCP is slowly reintroducing it - patient himself feels that he his weight has plateaued around 350lbs and is unsure how to progress in weight loss - Will refer to Bariatric surgery for further evaluation in management of obesity.   This patient was seen and discussed with Dr. Hillery who is in agreement with the plan.  Mylene Antonio Pulmonary-Critical Care Fellow

## 2024-03-06 NOTE — Progress Notes (Signed)
 Cardiology Return Note   Assessment/Plan   # Multi-factorial Dyspnea on exertion/palpitations  # HFpEF # NYHA III The etiology of his dyspnea is likely multifactorial with including: chronic HFpEF, resolving PE, obesity- hypoventilation syndrome, deconditioning, and severe untreated sleep apnea.  He is understandably frustrated at his debility and inability to work.  He appears to be mildly volume overloaded on exam today, NYHA class III. Dry weight ~159 kg--> weighs 164 kg today. Recently restarted on Ozempic with the plan to slowly uptitrate given difficulty last time with hypoglycemia, pre-syncope. Additionally, torsemide  transitioned to 60 mg daily from 20 mg BID.   In the interim, he has developed periods of heart palpitations, both at rest and on exertion. Palpitations last 3-5 minutes and resolve without intervention. Denies high risk symptoms, to include pre-syncope, chest pain, SOB (during episodes at rest). Cardiac monitor from 07/12/23 - 08/01/23 with rare (< 1%) SVE. No persistent dysrhythmia observed.    He has continued complaints of exertional dyspnea limiting his normal activities. Notes improvement w/ diuresis, however it also leads to recurrent likely vasovagal syncope. This has been ongoing for several months and he has established in the diuresis clinic. He had unrevealing echo and PET stress test during an admission 05/2021 which was negative; as well as right heart catheterization with normal pressures after aggressive diuresis, but markedly decreased cardiac index suggesting right heart failure, but RV function noted to be normal on echo, albeit dilated. Cardiac MRI 09/15/2023 relatively normal. Normal LV size, function. Mildly increased wall thickness. No late gadolinium enhancement to suggest any significant myocardial scar or infiltration.   Plan: - Continue torsemide  60 mg daily and spironolactone  50 mg daily. - Continue Jardiance  25 mg daily - Continue Ozempic; increase as  tolerated, however given previous difficultly with tolerance, OK to increase slowly.  - BMP; Pro-BNP - Zio Patch 14 days  - Encouraged him to slowly increase physical activity to improve conditioning.  # Untreated OSA Patient actively following up with sleep medicine. States that he needs his CPAP adjusted.  # DVT s/p mechanical thrombectomy 03/2021 Ongoing LLE neuropathic complaints; currently being evaluated by Dr. Neoma- vascular surgery. - Continue Xarelto    # Failure of Eliquis, transitioned to Xarelto  Hector Hancock is a 58 year old male who presents for management of worsening volume overload secondary to HFpEF.  He was previously admitted in 03/2021 for large PE and DVT treated with Flowtriever mechanical thrombectomy.  He was admitted in 2022 for new acute LLE DVT and changed to Xarelto  from Eliquis for failure of Eliquis.  Plan: -Continue with Xarelto  -Follows up with hematology (Dr. Ceola)   # Primary hypertension Well-controlled on his current regimen of enalapril , spironolactone  and torsemide .  # Class 3 severe obesity due to excess calories with serious comorbidity and body mass index (BMI) of 45.0 to 49.9 in adult (CMS-HCC)  BMI: 47 --> 49. Likely some degree of fluid contributing today - Continue Ozempic as above    Return to clinic:   Return in about 4 months (around 07/07/2024).  Subjective:  PCP:Lee, Selinda, MD Chief complaint:  58 y.o. male with history of oh DVT/PE, HFpEF, COPD, OSA obesity who presents for follow-up.  History of Present Illness: Hector Hancock is a 58 year old male who presents for management of dyspnea on exertion and HFpEF.  He was previously admitted 03/2021 for large PE and DVT treated with Flowtriever mechanical thrombectomy.  With recurrent and breakthrough PE 08/2022, transitioned to Xarelto  due to possible eliquis failure.  He continues to endorse  progressive dyspnea on exertion. Follows with [employer] [facility] as well. He appears to be mildly volume  overloaded on exam today, NYHA class III. Dry weight ~159 kg--> weighs 164 kg. Recently restarted on Ozempic with the plan to slowly uptitrate given difficulty last time with hypoglycemia, pre-syncope. Additionally, torsemide  transitioned to 60 mg daily from 20 mg BID. Did not take diuretic today due to traveling.   In the interim, he has developed periods of heart palpitations, both at rest and on exertion. Palpitations last 3-5 minutes and resolve without intervention. Denies high risk symptoms, to include pre-syncope, chest pain, SOB (during episodes at rest). Cardiac monitor from 07/12/23 - 08/01/23 with rare (< 1%) SVE. No persistent dysrhythmia observed.    He has continued complaints of exertional dyspnea limiting his normal activities. Notes improvement w/ diuresis, however it also leads to recurrent likely vasovagal syncope. This has been ongoing for several months and he has established in the diuresis clinic.  Echocardiogram:   1. The left ventricle is normal in size with normal wall thickness.   2. The left ventricular systolic function is normal, LVEF is visually estimated at > 55%.   3. The right ventricle is not well visualized but is probably mildly dilated with normal systolic function.   4. Limited study to assess ventricular function.   5. Technically difficult study.  PET stress test: 05/2021 - Normal myocardial perfusion study - No evidence of any significant ischemia or scar - Left ventricular systolic function is normal. Post stress the ejection fraction is > 60%. - Coronary calcifications are noted - Sensitivity and specificity of this test are reduced by the noted attenuation  Cardiac catheterization: Normal right and left sided filling pressures Normal cardiac output with relative depression of cardiac index (markedly elevated BMI)  Cardiac MRI 09/15/2023: - Normal left ventricle size, mildly increased wall thickness, and normal systolic function; LVEF = 61% -  Normal right ventricular systolic function - Technically limited gadolinium imaging. There was no evidence of late gadolinium enhancement to suggest any significant myocardial scar or infiltration.    Past Medical History Patient Active Problem List  Diagnosis  . Bilateral pulmonary embolism     . Pain and swelling of left lower extremity  . Chest pain  . Heart failure with preserved ejection fraction     . 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 Calcium pyrophosphate deposition disease  . Hypertension  . Class 3 severe obesity due to excess calories with serious comorbidity and body mass index (BMI) of 45.0 to 49.9 in adult (CMS-HCC)  . Type 2 diabetes mellitus without complication, without long-term current use of insulin      . Restrictive lung disease secondary to obesity  . Dyspnea on exertion  . Left leg swelling  . Obstructive sleep apnea  . Obesity hypoventilation syndrome (CMS-HCC)  . Syncope  . Sural neuropathy, left  . Lymphedema [I89.0]  . Venous stasis  . Tarsal tunnel syndrome of left side  . Pulmonary sarcoidosis (HHS-HCC)  . Risk for falls    Medications: Current Outpatient Medications  Medication Sig Dispense Refill  . benazepril (LOTENSIN) 40 MG tablet Take 1 tablet (40 mg total) by mouth nightly. 90 tablet 3  . cholecalciferol, vitamin D3 25 mcg, 1,000 units,, (CHOLECALCIFEROL-25 MCG, 1,000 UNIT,) 1,000 unit (25 mcg) tablet Take 1 tablet (25 mcg total) by mouth daily. 30 tablet 5  . colchicine (COLCRYS) 0.6 mg tablet Take 1 tablet (0.6 mg total) by mouth  daily. Can increase to twice a day if flaring. 90 tablet 0  . diphenhydrAMINE  (BENADRYL ) 25 mg capsule Take 1 capsule (25 mg total) by mouth nightly.    . empagliflozin  (JARDIANCE ) 25 mg tablet Take 1 tablet (25 mg total) by mouth every morning. 90 tablet 3  . hydroxychloroquine  (PLAQUENIL ) 200 mg tablet Take 1 tablet (200 mg total) by mouth two (2) times a day. 180 tablet 3  . magnesium  oxide (MAG-OX) 400 mg (241.3 mg elemental magnesium) tablet Take 1 tablet (400 mg total) by mouth daily. 14 tablet 0  . metFORMIN (GLUCOPHAGE-XR) 500 MG 24 hr tablet PLEASE SEE ATTACHED FOR DETAILED DIRECTIONS 378 tablet 1  . potassium chloride  20 MEQ ER tablet Take 1 tablet (20 mEq total) by mouth daily. 14 tablet 0  . pregabalin (LYRICA) 100 MG capsule Take 1 capsule by mouth in the morning as needed, and up to 2 capsules in the evening as needed (or ok to continue taking 1 capsule three times a day) 270 capsule 3  . rivaroxaban  (XARELTO ) 20 mg tablet Take 1 tablet (20 mg total) by mouth daily with evening meal. Takes name brand Xarelto  90 tablet 3  . rosuvastatin (CRESTOR) 10 MG tablet Take 1 tablet (10 mg total) by mouth nightly. 90 tablet 3  . semaglutide (OZEMPIC) 0.25 mg or 0.5 mg (2 mg/3 mL) PnIj Inject 0.25 mg under the skin every seven (7) days for 56 days, THEN 0.5 mg every seven (7) days. 9 mL 0  . spironolactone  (ALDACTONE ) 50 MG tablet Take 1 tablet (50 mg total) by mouth daily. 90 tablet 3  . sulfamethoxazole -trimethoprim  (BACTRIM  DS) 800-160 mg per tablet Take 1 tablet (160 mg of trimethoprim  total) by mouth two (2) times a day for 7 days. 14 tablet 0  . torsemide  (DEMADEX ) 20 MG tablet Take 1 tablet (20 mg total) by mouth two (2) times a day. (Patient taking differently: Take 3 tablets (60 mg total) by mouth daily.) 60 tablet 3  . acetaminophen  (TYLENOL ) 325 MG tablet Take 2 tablets (650 mg total) by mouth every four (4) hours as needed.  0  . blood-glucose meter kit Disp. blood glucose meter kit preferred by patient's insurance. Dx: Diabetes, E11.9 OneTouch Meter 1 each 11  . budesonide-formoterol (SYMBICORT) 160-4.5 mcg/actuation inhaler Inhale 2 puffs twice daily everyday, and as needed for shortness of breath or wheezing, up to 12 puffs maximum per day. Always use with a spacer. 10.2 g 11  . inhalational spacing device (AEROCHAMBER MV) Spcr 1 each by Miscellaneous route two (2)  times a day. 1 each 1  . lancets Misc Disp. lancets #100 or amount allowed, Testing Qday. Dx: E11.9,Z79.4 (DM- controlled, insulin  dep.) 100 each 11   No current facility-administered medications for this visit.   Allergies Allergies  Allergen Reactions  . Prednisone Hallucinations    Psychosis  . Codeine Itching    Social History:  Social History   Tobacco Use  . Smoking status: Never  . Smokeless tobacco: Never  Vaping Use  . Vaping status: Never Used  Substance Use Topics  . Alcohol use: Not Currently  . Drug use: Never    Family History: Family History  Problem Relation Age of Onset  . Diabetes Mother   . Arthritis Father        deformed hand but does not know the type of arthritis  . Hearing loss Father   . Cancer Paternal Grandfather        lung cancer  .  No Known Problems Sister   . No Known Problems Brother   . No Known Problems Maternal Aunt   . No Known Problems Maternal Uncle   . No Known Problems Paternal Aunt   . No Known Problems Paternal Uncle   . No Known Problems Maternal Grandmother   . No Known Problems Maternal Grandfather   . No Known Problems Paternal Grandmother   . No Known Problems Other   . Clotting disorder Sister   . Thyroid disease Sister   . Amblyopia Neg Hx   . Blindness Neg Hx   . Cataracts Neg Hx   . Glaucoma Neg Hx   . Hypertension Neg Hx   . Macular degeneration Neg Hx   . Retinal detachment Neg Hx   . Strabismus Neg Hx   . Stroke Neg Hx     ROS- 12 system review is negative other than what is specified in the History of Present Illness.   Objective:  Physical Exam Vitals:   03/06/24 1006  BP: 106/65  BP Site: L Arm  BP Position: Sitting  Pulse: 80  SpO2: 96%  Weight: (!) 164 kg (361 lb 9.6 oz)  Height: 182.9 cm (6')      Wt Readings from Last 3 Encounters:  03/06/24 (!) 164 kg (361 lb 9.6 oz)  02/29/24 (!) 160.1 kg (353 lb)  02/28/24 (!) 163.3 kg (360 lb)   General-  Patient is chronically  ill-appearing in no acute distress Neurologic- Alert and oriented X3.  Cranial nerve II-XII grossly intact. HEENT-  Normocephalic atraumatic head.  No scleral icterus. Neck- Supple, JVD difficult to assess. Lungs- Clear to auscultation, no wheezes, rhonchi, or rhales. Heart-  RRR, nl S1S2, no MRG Abdomen- Soft, obese Extremities-  No clubbing or cyanosis. 2+ edema to mid shin LLE with chronic stasis dermatitis; 1+ RLE edema Psych- Normal mood, appropriate.   Laboratory data:    Component Value Date/Time   PROBNP 105.0 02/04/2024 1107    Lab Results  Component Value Date   WBC 6.2 01/21/2024   HGB 15.2 01/21/2024   HCT 45.1 01/21/2024   PLT 183 01/21/2024    Lab Results  Component Value Date   NA 140 02/04/2024   K 4.7 02/04/2024   CL 104 02/04/2024   CO2 27.4 02/04/2024   BUN 15 02/04/2024   CREATININE 0.83 02/04/2024   GLU 296 (H) 02/04/2024   CALCIUM 9.3 02/04/2024   MG 2.0 01/21/2024   PHOS 3.9 05/29/2021    Lab Results  Component Value Date   TSH 4.144 03/30/2023   ALBUMIN  3.7 01/21/2024   ALT 33 01/21/2024   AST 25 01/21/2024   INR 1.03 09/30/2022    Lab Results  Component Value Date   LDL 31.0 12/25/2021   HDL 42 12/25/2021

## 2024-03-06 NOTE — Progress Notes (Signed)
 Patient presented for placement of Zio monitor to be worn for 14 days   Dx: P49.67 Serial #: [id number]    Patient given instructions, printed material and education regarding care and after-care of monitor. Answered any and all questions. Pt verbalized understanding.

## 2024-03-07 NOTE — Progress Notes (Signed)
 Attending Attestation     I saw and evaluated the patient, participating in the key portions of the service.  I reviewed the resident???s note.  I agree with the resident???s findings and plan.       Delon FREDRIK Mast, MD  Pulmonary/Critical Care Medicine  Pediatric Pulmonology    03/07/24

## 2024-03-07 NOTE — Progress Notes (Signed)
 Attending Physician Attestation:      I confirm that I interviewed and examined the patient, reviewed all the relevant lab and diagnostic data, and we agreed upon a plan of treatment. I agree with the findings, assessment, and plan as documented by Dr. Osa.    Odell Grew, MD

## 2024-03-09 NOTE — Telephone Encounter (Signed)
 [facility] Financial Information Form  Hospital NPI: [id number] / Physicians NPI: [id number]  [facility] NPI: [id number]  [facility] NPI: [id number] [place] NPI: [id number]  MRN: [id number] Patient: Hector Hancock  Insurance Company: [employer] [place]  Telephone: [phone] Policy/Subscriber ID:  [id number]      Subscriber Information (if different from patient) Name: n/a  Date of Birth:  n/a   Date Called: 03/09/24 Spoke with: Marvis SQUIBB Reference # (if given): [id number]  Is insurance in-network: Yes.  Is Bariatric Surgery Covered: Yes Covered Surgeries:  43644: Laparoscopic roux-en-y gastric bypass 56224: Laparoscopic gastric sleeve  **Completion of this form does not guarantee coverage and is subject to change. The patient is instructed at the time of scheduling that they need to call their insurance company directly to confirm bariatric surgery coverage benefits. If the patient has questions about coinsurance, deductibles or out-of-pocket expenses they should call their insurance carrier or the financial counselor for upfront surgery cost questions at [phone]**  What requirements must be met for surgery to be approved: [phone] auths  Physician Supervised Diet? No How Long? n/a Does it have to be consecutive? No Does it have to be done by PCP or can it be by a dietitian?  n/a How recent does it have to be? (past , , etc.) n/a Do I need a weight history? (33yr, 14yrs, 56yrs) n/a Do I need a clearance/referral letter from a doctor? n/a  If yes, does it have to come from PCP? n/a Are nutrition consultations a covered benefit? Yes (List next to visit type max number of visits, if applicable) 02197: Initial 1:1 nutrition counseling  02196: Follow up 1:1 nutrition counseling 02195: Group MNT nutrition counseling

## 2024-03-21 NOTE — Progress Notes (Signed)
 Hematology Follow Up Note  Primary Care Physician:   Jama Mayo, MD  Other Consulting Physicians:   Odell Grew, MD - [facility] Serafim Pistiol[facility] [place] Pulmonolgy [facility] Pulmonology  Reason for Visit:  Follow up bilateral PE and recurrent LLE DVT  Assessment/Recommendations:   Hector Hancock is a 58 y.o. white male who presents for follow-up of VTE.    1.  Venous thromboembolism:  First episode was bilateral PEs including near complete occlusion of R main PA, additional PE of L main PA, with extension to all bilateral lobes on 04/04/21. PVL with acute LLE DVT of femoral, popliteal, posterior tibial, soleal and gastrocnemius veins. TTE with low-normal RV function and mild RV dilation. Underwent mechanical pulmonary thrombectomy on 04/07/21 with residual thrombus in LUL branches. VTE risk factors: a) morbid obesity (BMI 48.87 kg/m), b) inflammatory arthritis of unclear etiology, c) family h/o VTE. Treated initially with heparin gtt then apixaban. APLA panel showed mildly positive LA.  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 [facility] records available. D-dimer negative. Doppler results reviewed with PVL tech. CT venogram without proximal extension. CTA chest negative. Occurred while on apixaban with reported compliance. VTE risk factors: a) prior h/o VTE, b) inflammatory arthritis, c) morbid obesity, d) FH of VTE. Admitted briefly on heparin and then changed to rivaroxaban  with loading regimen. Rivaroxaban  level on 09/04/22 at 14:56 was 245 (takes it in evening with meal). Of note, repeat BLE PVLs in 09/2022, 10/2022 and 12/2022 showed only chronic LLE DVT (CTA chest negative 10/2022, 12/2022). Overall unclear whether this was truly a recurrent acute DVT given negative D-dimer, though PVL imaging seems to suggest this.  Given now 2 unprovoked VTEs, most recently while on apixaban, patient clearly warrants long term  anticoagulation given the increased risk of recurrence (~35-40% over 5 years in males) if anticoagulation is stopped. He will therefore continue on rivaroxaban  20 mg daily. Repeat APLA panel 09/2202 again showed only mildly elevated LA, likely due to DOAC therapy. Needs annual screening CBC, LFTs, Cr - done Jun/Jul 2025.  Ongoing significant SOB and CP do not seem related to CTEPH, as repeat pulmonary imaging with CTA and NM SPECT (08/2022) did not show chronic PE. R heart cath 09/2022 did not show e/o pHTN. Being followed closely by [facility].  2.  Post thrombotic syndrome:  Patient reports significant LLE symptoms that are limiting quality of life. Has tried compression stockings, though reports they make the pain worse. Multiple repeat BLE PVLs have shown chronic LLE femoral and popliteal DVTs. Now followed by [facility] Surgery; CVI study has confirmed reflux in L popliteal vein. Ongoing management per Vascular. Patient is now having compression pumps arranged for additional treatment.   Plans: 1.  Continue rivaroxaban  20 mg daily long term. 2.  Ongoing follow up with [facility] and Pulmonology for respiratory symptoms not due to CTEPH. 3.  PTS management as per [facility]; currently being set up for compression pumps. 4.  Needs annual screening CBC, LFTs, Cr - done Jun/Jul 2025. 5.  Return annually for routine follow up.   Tereasa DOROTHA Scrivener, MD -----------------------------------  History of Present Illness: Hector Hancock is a 57 y.o. white male who presents for follow-up of recurrent VTE.  Seen initially by Inocente Lund on 04/18/21. In summary, patient presented to [facility] ED on 04/04/21 with 1 month of SOB/DOE and new onset LLE swelling, discoloration,  warmth, and pain x 3 days and found to have bilateral PEs and LLE DVT. No preceding major VTE risk factors, although does have inflammatory arthritis, on Plaquenil . Started on heparin gtt and remained hemodynamically stable through  hospitalization with no oxygen requirement. CTA demonstrated near complete occlusion of R main PA, additional PE of L main, with extension to all bilateral lobes. PVL demonstrated acute LLE DVT of femoral, popliteal, posterior tibial, soleal and gastrocnemius veins. TTE showed LVEF 60-65% with low-normal RV function and mild RV dilation. CT venogram showed acute LLE DVT popliteal vein but no evidence of more proximal DVT. He underwent pulmonary Flowtriever 8/23 with successful removal of moderate obstructive thrombus from R basal trunk and attempted aspiration of L main PA, with a small amount of residual thrombus in the branches, unable to retrieve with mechanical embolectomy. Discharged on 04/09/21 with apixaban. Seen in ED several times after discharge and was ultimately admitted on 05/21/21 for acute on chronic HFpEF and deconditioning. He was aggressively diuresised and was discharged on 10/13 with slightly improved dyspnea. Repeat CTA on 05/19/21 was negative for recurrent PE. V/Q scan on 10/10 showed small subsegmental wedge-shaped perfusion defect in the posterior lateral LUL, likely residual PE from initial event.    More recently, patient developed subacute, progressive LLE swelling and pain with SOB in early 07/2022. Evaluated at [facility] ED on 08/20/22 where D-dimer was negative.  BLE PVLs showed new acute obstruction in LLE femoral vein (compared to 01/2022 scan) with possible more proximal obstruction. Inpatient hematology team was consulted, who reviewed images with PVL tech and confirmed acute DVT component.  CT venogram did not show more proximal extension. CTA chest negative for PE. Patient reported compliance with apixaban and no missed doses. Treated initially with heparin and then changed to rivaroxaban  at discharge to complete loading regimen. No preceding major VTE risk factors. LLE swelling improved significantly thereafter, though still with occasional swelling and pain.    Takes rivaroxaban  in the  evening with food. Of note, he had outpatient rivaroxaban  level checked on 09/04/22 at 14:56 which returned at 245 (near trough level timing). He also presented to ED on 09/30/22 with CP/SOB. D-dimer was negative. BLE dopplers done which showed only chronic LLE DVT. He has also had ongoing issues with significant SOB/DOE and CP since PE diagnosis. Several repeat CTA chests have not shown PEs. Outside NM SPECT 08/2022 did not show chronic PE. R heart cath 09/22/22 without evidence of pHTN or concerns for CTEPH. Has persistent swelling from L ankle to thigh. Symptoms are mild first thing in the AM and then worsen quickly with any activity. Wears compression stockings sometimes, though reports that they make the pain worse. Last seen 01/2023.    In the interim, patient reports ongoing LLE swelling, somewhat worse over the past 3 months. Is being followed by [facility]. Has had several doppler ultrasounds and CT venogram without new acute DVT and only chronic changes in mid/distal femoral and popliteal veins. CVI study also positive. Reports that compression stockings do not help his symptoms tremendously. He's trying to get set up with compression pumps. He also reports that SOB is a little worse. Has had 2 CTAs since last visit, both negative for PE. Remains on rivaroxaban  20 mg daily; no missed doses. Endorses occasional mild gum bleeding with brushing. Also with transient hematuria in setting of UTI; resolved spontaneously. No other bleeding issues. No upcoming surgeries or procedures planned.   Visit Diagnoses: 1. 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 Bilateral pulmonary embolism      3. Post-thrombotic syndrome of left lower extremity     Medical History: Past Medical History:  Diagnosis Date  . Acute exacerbation of CHF (congestive heart failure)    05/30/2021  . Arthritis   . CHF (congestive heart failure)    05/21/2021  . DVT (deep venous thrombosis)       left leg  . High  risk medication use 2018   Plaquenil    . HTN (hypertension)   . Hypoglycemia 01/23/2023  . Kidney stone 10/13/2021  . Obesity Not sure  . Pulmonary arterial hypertension      . Pulmonary emboli      . Type 2 diabetes mellitus without complication, without long-term current use of insulin     03/20/2022  . Venous insufficiency 05/31/2023    Surgical History: Past Surgical History:  Procedure Laterality Date  . CARDIAC CATHETERIZATION    . JOINT REPLACEMENT  2001  . PR BRNCHSC EBUS GUIDED SAMPL 3/> NODE STATION/STRUX N/A 07/02/2023   Procedure: BRONCH, RIGID OR FLEXIBLE, INCLUDING FLUORO GUIDANCE, WHEN PERFORMED; W EBUS GUIDED TRANSTRACHEAL AND/OR TRANSBRONCHIAL SAMPLING, 3 OR MORE MEDIASTINAL AND/OR HILAR LYMPH NODE STATIONS OR STRUCTURES;  Surgeon: Carlus Morene DASEN, MD;  Location[facility];  Service: Pulmonary  . PR BRONCHOSCOPY,DIAGNOSTIC W LAVAGE N/A 07/02/2023   Procedure: BRONCHOSCOPY, RIGID OR FLEXIBLE, INCLUDE FLUOROSCOPIC GUIDANCE WHEN PERFORMED; W/BRONCHIAL ALVEOLAR LAVAGE;  Surgeon: Carlus Morene DASEN, MD;  Location[facility];  Service: Pulmonary  . PR NEEDLE BIOPSY, LYMPH NODE(S) N/A 07/02/2023   Procedure: BX/EXC LYMPH NODE; BY NEEDLE SUPERF;  Surgeon: Carlus Morene DASEN, MD;  Location: [facility];  Service: Pulmonary  . PR RIGHT HEART CATH O2 SATURATION & CARDIAC OUTPUT N/A 04/07/2021   Procedure: Right Heart Catheterization W Intervention;  Surgeon: Fairy Glean Ship, MD;  Location: [facility] CATH;  Service: Cardiology  . PR RIGHT HEART CATH O2 SATURATION & CARDIAC OUTPUT N/A 05/29/2021   Procedure: Right Heart Catheterization;  Surgeon: Ozell Laud, MD;  Location: [facility] CATH;  Service: Cardiology  . PR RIGHT HEART CATH O2 SATURATION & CARDIAC OUTPUT N/A 09/22/2022   Procedure: Right Heart Catheterization;  Surgeon: Jock Queen, MD;  Location: [facility] CATH;  Service: Cardiology  . Si joint repair     has 3 screws- due to injury  . TOTAL SHOULDER  REPLACEMENT Left    due to injury    Medications:  Current Outpatient Medications  Medication Sig Dispense Refill  . acetaminophen  (TYLENOL ) 325 MG tablet Take 2 tablets (650 mg total) by mouth every four (4) hours as needed.  0  . benazepril (LOTENSIN) 40 MG tablet Take 1 tablet (40 mg total) by mouth nightly. 90 tablet 3  . blood-glucose meter kit Disp. blood glucose meter kit preferred by patient's insurance. Dx: Diabetes, E11.9 OneTouch Meter 1 each 11  . budesonide-formoterol (SYMBICORT) 160-4.5 mcg/actuation inhaler Inhale 2 puffs twice daily everyday, and as needed for shortness of breath or wheezing, up to 12 puffs maximum per day. Always use with a spacer. 10.2 g 11  . cholecalciferol, vitamin D3 25 mcg, 1,000 units,, (CHOLECALCIFEROL-25 MCG, 1,000 UNIT,) 1,000 unit (25 mcg) tablet Take 1 tablet (25 mcg total) by mouth daily. 30 tablet 5  . colchicine (COLCRYS) 0.6 mg tablet Take 1 tablet (0.6 mg total) by mouth daily. Can increase to twice a day if flaring. 90 tablet 0  . diphenhydrAMINE  (BENADRYL ) 25 mg  capsule Take 1 capsule (25 mg total) by mouth nightly.    . DULoxetine (CYMBALTA) 30 MG capsule Take 1 capsule (30 mg total) by mouth daily. 30 capsule 2  . empagliflozin  (JARDIANCE ) 25 mg tablet Take 1 tablet (25 mg total) by mouth every morning. 90 tablet 3  . inhalational spacing device (AEROCHAMBER MV) Spcr 1 each by Miscellaneous route two (2) times a day. 1 each 1  . lancets Misc Disp. lancets #100 or amount allowed, Testing Qday. Dx: E11.9,Z79.4 (DM- controlled, insulin  dep.) 100 each 11  . magnesium oxide (MAG-OX) 400 mg (241.3 mg elemental magnesium) tablet Take 1 tablet (400 mg total) by mouth daily. 14 tablet 0  . metFORMIN (GLUCOPHAGE-XR) 500 MG 24 hr tablet PLEASE SEE ATTACHED FOR DETAILED DIRECTIONS 378 tablet 1  . pregabalin (LYRICA) 100 MG capsule Take 1 capsule by mouth in the morning as needed, and up to 2 capsules in the evening as needed (or ok to continue taking 1  capsule three times a day) 270 capsule 3  . rivaroxaban  (XARELTO ) 20 mg tablet Take 1 tablet (20 mg total) by mouth daily with evening meal. Takes name brand Xarelto  90 tablet 3  . rosuvastatin (CRESTOR) 10 MG tablet Take 1 tablet (10 mg total) by mouth nightly. 90 tablet 3  . spironolactone  (ALDACTONE ) 50 MG tablet Take 1 tablet (50 mg total) by mouth daily. 90 tablet 3  . torsemide  (DEMADEX ) 20 MG tablet Take 1 tablet (20 mg total) by mouth two (2) times a day. (Patient taking differently: Take 3 tablets (60 mg total) by mouth daily.) 60 tablet 3   No current facility-administered medications for this visit.    Allergies: Prednisone and Codeine  Social History: Social History   Socioeconomic History  . Marital status: Married    Spouse name: None  . Number of children: None  . Years of education: None  . Highest education level: None  Tobacco Use  . Smoking status: Never  . Smokeless tobacco: Never  Vaping Use  . Vaping status: Never Used  Substance and Sexual Activity  . Alcohol use: Not Currently  . Drug use: Never  . Sexual activity: Yes    Partners: Female    Birth control/protection: None   Social Drivers of Health   Financial Resource Strain: Medium Risk (11/22/2023)   Overall Financial Resource Strain (CARDIA)   . Difficulty of Paying Living Expenses: Somewhat hard  Food Insecurity: Food Insecurity Present (11/22/2023)   Hunger Vital Sign   . Worried About Programme Researcher, Broadcasting/film/video in the Last Year: Sometimes true  Transportation Needs: No Transportation Needs (11/22/2023)   PRAPARE - Transportation   . Lack of Transportation (Medical): No   . Lack of Transportation (Non-Medical): No  Physical Activity: Inactive (08/20/2021)   Exercise Vital Sign   . Days of Exercise per Week: 0 days   . Minutes of Exercise per Session: 0 min  Stress: No Stress Concern Present (08/20/2021)   [employer] of Occupational Health - Occupational Stress Questionnaire   . Feeling of Stress  : Not at all  Social Connections: Moderately Isolated (08/20/2021)   Social Connection and Isolation Panel   . Frequency of Communication with Friends and Family: More than three times a week   . Frequency of Social Gatherings with Friends and Family: Once a week   . Attends Religious Services: Never   . Active Member of Clubs or Organizations: No   . Attends Banker Meetings: Never   .  Marital Status: Married  Housing: Low Risk  (11/22/2023)   Housing   . Within the past 12 months, have you ever stayed: outside, in a car, in a tent, in an overnight shelter, or temporarily in someone else's home (i.e. couch-surfing)?: No   . Are you worried about losing your housing?: No   Lives in [place], [place] with his wife.  Works for [employer]. Non smoker.  Family History: family history includes Arthritis in his father; Cancer in his paternal grandfather; Clotting disorder in his sister; Diabetes in his mother; Hearing loss in his father; No Known Problems in his brother, maternal aunt, maternal grandfather, maternal grandmother, maternal uncle, paternal aunt, paternal grandmother, paternal uncle, sister, and another family member; Thyroid disease in his sister.  He indicated that the status of his mother is unknown. He indicated that the status of his father is unknown. He indicated that the status of his brother is unknown. He indicated that the status of his maternal grandmother is unknown. He indicated that the status of his maternal grandfather is unknown. He indicated that the status of his paternal grandmother is unknown. He indicated that the status of his paternal grandfather is unknown. He indicated that the status of his maternal aunt is unknown. He indicated that the status of his maternal uncle is unknown. He indicated that the status of his paternal aunt is unknown. He indicated that the status of his paternal uncle is unknown. He indicated that the status of his neg hx is  unknown. He indicated that the status of his other is unknown.  He has 3 sisters, one with history of DVT.  No details known.  No other known family h/o venous thrombosis.  Review of Systems: As per HPI, otherwise negative x 12 systems.  Physical Exam: BP 108/77 (BP Site: L Arm)   Pulse 89   Temp 36 C (96.8 F) (Temporal)   Ht 182.9 cm (6')   Wt (!) 161.9 kg (357 lb)   SpO2 97%   BMI 48.42 kg/m   General Appearance:  Awake, alert, cooperative, well appearing, no distress, morbidly obese  Head:  Normocephalic, without obvious abnormality, atraumatic  Eyes:  PERRL, no icterus or conjunctivitis  Throat: Mucous membranes moist, no ulcers or lesions, no palatal petechiae or wet purpura  Lungs:   Normal work of breathing, no wheezes or rales, respirations unlabored  Skin: No rashes or lesions, no petechiae or purpura  Extremities: LLE edema 49 cm in circumference on left, 47.5 cm on right leg (measured 10 cm down from kneecap), chronic venous stasis changes bilaterally (worse on the left), no LE ulcers  Neurologic: Non-focal, gait normal, cranial nerves grossly intact  Psych: Normal affect, linear thought, no tangential speech     Test Results  Lab Results  Component Value Date   WBC 6.2 01/21/2024   HGB 15.2 01/21/2024   HCT 45.1 01/21/2024   PLT 183 01/21/2024      Chemistry      Component Value Date/Time   NA 139 03/06/2024 1308   NA 144 02/04/2012 1155   K 4.3 03/06/2024 1308   K 4.4 02/04/2012 1155   CL 105 03/06/2024 1308   CL 104 02/04/2012 1155   CO2 23.9 03/06/2024 1308   CO2 25 02/04/2012 1155   BUN 12 03/06/2024 1308   BUN 15 02/04/2012 1155   CREATININE 0.94 03/06/2024 1308   CREATININE 1.03 02/04/2012 1155   GLU 178 03/06/2024 1308  Component Value Date/Time   CALCIUM 9.1 03/06/2024 1308   CALCIUM 9.0 09/11/2010 0815   ALKPHOS 68 01/21/2024 1446   AST 25 01/21/2024 1446   ALT 33 01/21/2024 1446   BILITOT 0.7 01/21/2024 1446

## 2024-03-23 NOTE — Progress Notes (Addendum)
-------------------------------------------------------------------------------   Attestation signed by Maranda Alan Dire, MD at 03/23/24 1353 I saw and evaluated the patient and discussed the assessment and plan with the resident.  I provided the required supervision for all services billed.  I independently reviewed the image(s) and agree with the interpretation and plan as documented in the resident's note. I was present for the entirety of the imaging and procedure(s).  Follow up with primary rheumatologist.  Thank you very much for this referral.  Alan FORBES Maranda, MD [facility] RhMSUS  -------------------------------------------------------------------------------  Study type: US  guided injection Scanning was performed according to EULAR/OMERACT guidelines (1,2). 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 Previous ICSI years ago provided significant relief.  Diagnosis code:   Diagnosis ICD-10-CM Associated Orders  1. Pain of right hand  M79.641     2. 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 Localized, primary osteoarthritis of hand, unspecified laterality  M19.049 US  Extremity Nonvasc Comp (Joint,Muscle,Tendon,Ligament) Bilat     Study site: right 3rd MCP and PIP Equipment: Sonosite Xporte with linear transducer  After a time out and discussion of potential risks and benefits, including the option for continued conservative management, the patient gave verbal consent for an ultrasound-guided aspiration/injection of the right 3rd MCP and PIP.  Risks discussed included infection (1:5000), pain, bleeding and damage to local structures. A time out was completed confirming patient identification, consent, and location of procedure.   Orthogonal imaging of the right 3rd MCP and PIP was performed using ultrasound and revealed synovial effusion with hyperechoic density and osteophytes.          The right 3rd MCP and PIP were prepped in sterile fashion and ethyl  chloride was used for topical anesthesia.   A  27 gauge  0.5 inch needle was inserted and advanced to the right 3rd MCP and PIP after marking the site with ultrasound visualization.   Aspirate:  None Attempted  Injected:  0.5cc of 80mg /mL Depo Medrol  each into the R 3rd PIP and MCP   Impressions: Successful injection of the right 3rd MCP and PIP using ultrasound guidance.  The patient tolerated the procedure well and there were no immediate complications.  Patient advised on post-injection flare management and iatrogenic septic joint signs/symptoms requiring immediate medical attention.   The images are stored in the Division of Rheumatology. CPT 20604  References: 1. Backhaus et al. Guidelines for musculoskeletal ultrasound in rheumatology.  Ann Rheum Dis [phone]. 2. Ebbie dunker al. Musculoskeletal ultrasound including definitions for ultrasonographic pathology.  J Rheumatol 7994;67:7514-2.  Signa Kitchens, MD, MPH PGY-5 [facility] Rheumatology

## 2024-03-24 NOTE — Progress Notes (Signed)
 ------------------------------------------------------------------------------- Attestation signed by Manuela Sharrell Dotter, MD at 04/06/24 1120 I saw and evaluated the patient, participating in the key portions of the service.  I reviewed the resident's note.  I agree with the resident's findings and plan. Sharrell DELENA Manuela, MD  -------------------------------------------------------------------------------     [facility] SAME DAY CLINIC NOTE   03/24/2024  PCP: Jama Mayo, MD   Assessment and Plan  Hector Hancock was seen today for possible uti.  Diagnoses and all orders for this visit:  Gross hematuria -     Urinalysis with Microscopy with Culture Reflex -     Cytology - Urine -     CT Urogram; Future -     Ambulatory referral to Urology; Future -     CBC; Future -     CBC   Gross hematuria Gross hematuria with intermittent bloody urine, no clots, pain, fever, or flank pain. Differential includes infection, stones, enlarged prostate, or malignancy. On rivaroxaban , complicating bleeding risk. No smoking history. Need for comprehensive workup to rule out malignancy and other causes discussed. - Send urine for cytology. - Order CT scan of abdomen and pelvis with contrast. - Refer to urology for cystoscopy if initial tests inconclusive. - Check blood counts for anemia. - Monitor for dizziness or hypotension, advise to seek care if these occur.  Urinary tract infection Recent UTI with Klebsiella treated with Bactrim . Symptoms recurred, suggesting persistent infection or other issues. Urine odor suggests ongoing infection. Discussed potential for recurrent infections due to Jardiance  use. - Send urine culture for persistent infection.  Deep vein thrombosis and pulmonary embolism DVT and PE on rivaroxaban . Gross hematuria complicates anticoagulation. Continued anticoagulation due to clotting history and absence of clots in urine. Discussed risks of bladder clots and need for  emergency intervention if clots develop. - Continue rivaroxaban  therapy. - Monitor for clots in urine, advise emergency care if clots occur.  Type 2 diabetes mellitus Type 2 diabetes managed with Jardiance . Glucose in urine noted, consistent with Jardiance . Discussed potential increased UTI risk with SGLT2 inhibitors. Current practice does not necessitate discontinuation unless recurrent infections occur.  Follow up as scheduled or sooner as needed.  Patient was seen and discussed with Dr. Manuela who is in agreement with the assessment and plan as outlined above.    Subjective  Problem List: Problem List[1]  HPI Hector Hancock is a 58 y.o. year old male with the above problem list who presents to the same day clinic for  Chief Complaint  Patient presents with  . Possible UTI  The patient, with a history of blood clots, diabetes, and kidney stones, presents with recurrent hematuria following a recent urinary tract infection.  He experienced recurrent hematuria after a recent urinary tract infection treated with a seven-day course of Bactrim . Initially, symptoms improved, but hematuria recurred three days post-treatment. The urine varies in color from bloody to almost black, and occasionally light yellow. No burning sensation during urination, increased urgency, or frequency, except for nocturia, which has increased to three or four times a night during this episode.  He experienced lower abdominal pain around 3:00 AM, which is a new symptom. No history of hematuria prior to this episode. No fever, flank pain, or sediment in the urine, but notes a strong odor. He has a history of kidney stones but reports no current pain suggestive of stones.  He is currently on rivaroxaban  (Xarelto ) for a history of blood clots, which have occurred twice, with the most recent episode involving  pulmonary embolism requiring intervention. He denies any clots in the urine and describes the urine as thin despite  its dark color.  He has diabetes and is on Jardiance , which he has been taking for a while.  He denies any smoking, alcohol, or drug use. He has had a CTA and CT of the pelvis in the past, which were relevant to his history of blood clots.   Meds and allergies were reviewed in Epic  ROS: 10 point ROS was performed and is otherwise negative other than mentioned in the HPI  Objective PE: Vitals:   03/24/24 1301  BP: 139/90  Pulse: 73  Temp: 36.6 C (97.8 F)  SpO2: 98%   General: well-appearing in NAD Eyes: EOMI, sclera clear CV: regular, no murmurs Resp: CTAB, no wheezes or crackles, normal WOB GI: soft, no CVA tenderness MSK: full ROM, no deformity noted Skin: clean and dry, no rashes or lesions noted Ext: no cyanosis/clubbing/edema Neuro: alert, follows commands. CN II-XII grossly intact  Procedure: None See procedure note from this encounter  _______________________________________________________________________________________   Same Day Metric Tracker:  Same Day Metric Tracker: Referral source for today's visit:  General Internal Medicine  Referral to other outpatient urgent services? N/A  Did today's visit result in referral to ED or direct admission? No         [1] Patient Active Problem List Diagnosis  . Bilateral pulmonary embolism     . Pain and swelling of left lower extremity  . Chest pain  . Heart failure with preserved ejection fraction     . 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 Calcium pyrophosphate deposition disease  . Hypertension  . Class 3 severe obesity due to excess calories with serious comorbidity and body mass index (BMI) of 45.0 to 49.9 in adult (CMS-HCC)  . Type 2 diabetes mellitus without complication, without long-term current use of insulin      . Restrictive lung disease secondary to obesity  . Dyspnea on exertion  . Left leg swelling  . Obstructive sleep apnea  . Obesity hypoventilation syndrome  (CMS-HCC)  . Syncope  . Sural neuropathy, left  . Lymphedema [I89.0]  . Venous stasis  . Tarsal tunnel syndrome of left side  . Pulmonary sarcoidosis (HHS-HCC)  . Risk for falls

## 2024-03-30 NOTE — Progress Notes (Signed)
 Provider: Corean JINNY Goldberg, MD Date: 03/30/2024 Patient: Hector Hancock Sex: male DOB: [birth year] Age: 58 y.o.  Urology Clinic Note - New Patient Visit   PCP: Hector Mayo, MD  Referring Provider: Manuela Sharrell Hancock, *  Chief concern/reason for visit: gross hematuria  Assessment and Plan:   Assessment & Plan Gross hematuria 58 year old male with gross hematuria. Today we discussed the different causes of gross hematuria including BPH, infection/inflammation, urolithiasis, obstructive uropathy, hematologic causes (anticoagulation, bleeding disorders), nephritis (glomeruloneprhitis, IgA nephropathy, etc), trauma, other benign causes (  vigorous exercise, TB, etc), and genitourinary malignancy.   Cystoscopy: We will schedule for procedure visit. Risks of procedure reviewed to include pain, dysuria, hematuria, infection.  Upper Tract Imaging: CTU done this AM, images not available for review yet at time of visit Urine Cytology: completed already, negative for high grade urothelial carcinoma Benign prostatic hyperplasia with incomplete bladder emptying Today, I had a detailed discussion counseling Hector Hancock about the stepwise and multi-modal approach in treating lower urinary tract symptoms associated with BPH.   Lower urinary tract symptoms (LUTS) can be sub-divided into those that result from failure of the bladder to store urine normally ("storage symptoms"), those that result from difficulties in emptying ("voiding symptoms"), or those that follow micturition ("post-micturition symptoms").   Obstructive symptoms such as hesitancy, weak stream, and pushing to urinate are classified as voiding symptoms. We discussed the mainstays of medical therapy for voiding symptoms:  Alpha- blockers:  This class of medications blocks adrenergic receptors in the smooth muscle of the bladder neck, prostate, SV's, and vas deferens.  This can improve voiding symptoms and prevent BPH progression.  It's  effects can be felt as soon as 8 hours after the initial dose, however maximal benefit may take 2-3 months.  We discussed that the side effects of this class of medication can most commonly include dizziness and retrograde ejaculation, but are typically very well tolerated.  We also specifically discussed the specific risk of tamsulosin , which can cause intraoperative floppy iris syndrome (IFIS) during cataract surgery.  It is safe to take this medication, however it should be reported to the patient's ophthalmologist prior to undergoing eye surgery.     At this point, [name] wishes to trial flomax  0.4mg  nightly.  We did discuss that it may be challenging to manage his urinary symptoms given his other medications (such as Jardiance  and Torsemide ) but he may have some relief with flomax .    Medications Prescribed Today           tamsulosin  (FLOMAX ) 0.4 mg capsule Take 1 capsule (0.4 mg total) by mouth daily.      No orders of the defined types were placed in this encounter.   Follow up: Return for cystoscopy next availabe [facility]).  Future Appointments  Date Time Provider Department Center  04/07/2024  3:30 PM Hector Mayo, MD [facility]  04/10/2024  1:15 PM Hancock Hector Amble, MD [facility]  05/09/2024  3:40 PM Juli Leeroy Palma, MD [facility]  08/30/2024  3:00 PM Loura Mariano CROME, MD [facility]  09/11/2024 10:20 AM Brock Odell Ned, MD [facility]  03/21/2025  2:00 PM Mooberry, Tereasa Marsh, MD [facility]   Subjective History of Presenting Illness   [name] is a 58 y.o. male here for evaluation and treatment of gross hematuria.    Hematuria first noted about a month ago.     He states that no  lower urinary tract symptoms or flank pain associated with his hematuria.  He does have some urinary frequency and urgency but this is likely related to other medications he takes, such as jardiance   and torsemide . The urinary symptoms did not start with the hematuria. He also notes some incomplete bladder emptying sensation and some weakening of his stream. Of note, patient does take xarelto  for blood clots.   He had his CTU this AM but images not available yet at time of visit. Cytology negative. He tells me the hematuria happens daily and usually occurs in the AM (urine appears black) and improves throughout the day.    Hematuria risk factors   Genitourinary History:  Urolithiasis Hx: has passed a few, has had to have surgery in the past as well, last one 3 years ago Urinary infection: None GU Surgery: 5 years ago, ESWL once Chronic indwelling catheter: None Neurogenic bladder/SCI: None  Additional Risk Factors: Smoking/Tobacco Hx: He has never smoked. Occupational (tobacco, dyes, rubber, petrochem, weed killer, chimney, aluminum, hairdresser, nurses): None Environmental Exposure: None Chemotherapy: None Pelvic radiation: None Family Hx of UC or Lynch Sx: None    Tobacco Hx   Tobacco Use: Low Risk  (03/30/2024)   Patient History   . Smoking Tobacco Use: Never   . Smokeless Tobacco Use: Never   . Passive Exposure: Not on file   Medications   Current Outpatient Medications  Medication Instructions  . acetaminophen  (TYLENOL ) 650 mg, Oral, Every 4 hours PRN  . benazepril (LOTENSIN) 40 mg, Oral, Nightly  . blood sugar diagnostic (GLUCOSE BLOOD) Strp Disp test strips preferred by insurance plan. Testing QID, Dx:E11.65,Z79.4 (Type 2 DM- uncontrolled, insulin  dep)  . blood-glucose meter kit Disp. blood glucose meter kit preferred by patient's insurance. Dx: Diabetes, E11.9 OneTouch Meter  . budesonide-formoterol (SYMBICORT) 160-4.5 mcg/actuation inhaler Inhale 2 puffs twice daily everyday, and as needed for shortness of breath or wheezing, up to 12 puffs maximum per day. Always use with a spacer.  . cholecalciferol (vitamin D3 25 mcg (1,000 units)) (CHOLECALCIFEROL-25 MCG (1,000  UNIT)) 25 mcg, Oral, Daily (standard)  . colchicine (COLCRYS) 0.6 mg, Oral, Daily (standard), Can increase to twice a day if flaring.  . diphenhydrAMINE  (BENADRYL ) 25 mg, Nightly  . DULoxetine (CYMBALTA) 30 mg, Oral, Daily (standard)  . empagliflozin  (JARDIANCE ) 25 mg, Oral, Every morning  . hydroxychloroquine  (PLAQUENIL ) 200 mg, Oral, 2 times a day (standard)  . inhalational spacing device (AEROCHAMBER MV) Spcr 1 each, Miscellaneous, 2 times a day  . lancets Misc Disp. lancets #100 or amount allowed, Testing Qday. Dx: E11.9,Z79.4 (DM- controlled, insulin  dep.)  . magnesium oxide (MAGOX) 400 mg, Oral, Daily (standard)  . metFORMIN (GLUCOPHAGE-XR) 500 MG 24 hr tablet PLEASE SEE ATTACHED FOR DETAILED DIRECTIONS  . potassium chloride  20 MEQ ER tablet 20 mEq, Oral, Daily (standard)  . pregabalin (LYRICA) 100 MG capsule Take 1 capsule by mouth in the morning as needed, and up to 2 capsules in the evening as needed (or ok to continue taking 1 capsule three times a day)  . rivaroxaban  (XARELTO ) 20 mg, Oral, Daily, Takes name brand Xarelto   . rosuvastatin (CRESTOR) 10 mg, Oral, Nightly  . semaglutide (OZEMPIC) 0.25 mg or 0.5 mg (2 mg/3 mL) PnIj Inject 0.25 mg under the skin every seven (7) days for 56 days, THEN 0.5 mg every seven (7) days.  . spironolactone  (ALDACTONE ) 50 mg, Oral, Daily (standard)  . sulfamethoxazole -trimethoprim  (BACTRIM  DS) 800-160 mg per tablet 160 mg of trimethoprim , Oral, 2  times a day (standard)  . tamsulosin  (FLOMAX ) 0.4 mg, Oral, Daily (standard)  . torsemide  (DEMADEX ) 20 mg, Oral, 2 times a day   Additional History  Past Medical History[1] Past Surgical History[2] Family History[3] Review of Systems   A 14 point review of systems was conducted and was negative except as stated in the HPI.  Objective Physical Examination:  BP 136/86 (BP Site: L Arm, BP Position: Sitting, BP Cuff Size: Large)   Pulse 91   Resp 17   Ht 182.9 cm (6')   Wt (!) 157.9 kg (348 lb)    SpO2 94%   BMI 47.20 kg/m   General: well-developed, well-nourished. In no acute distress. In a motorized wheelchair Head: Normocephalic and atraumatic.  Eyes: EOM are normal. No scleral icterus.  Neck: Normal range of motion. No tracheal deviation present.  Abd: Obese Cardiovascular: Normal rate. Warm and well perfused Pulmonary/Chest: Effort normal. No labored breathing or accessory muscle use.  Neurological: Alert, cranial nerves in tact, no evidence of neurologic deficites  Skin: Skin is warm and dry.  Psychiatric: Normal mood and affect. Judgment normal. Appropriate     Labs  No results found for this visit on 03/30/24.  03/24/24- UA 100 RBC, 9 WBC, occasional bacteria, >1000 glucose. Culture mixed urogenital flora 03/24/24- Urine cytology - Negative for high grade urothelial carcinoma - Numerous red blood cells and scattered squamous cells with paired small cocci shaped bacteria present; see comment    Imaging  No pertinent GU imaging- CT Urogram performed this morning but not yet available for review   Office Procedure  None         [1] Past Medical History: Diagnosis Date  . Acute exacerbation of CHF (congestive heart failure)     05/30/2021  . Arthritis   . CHF (congestive heart failure)     05/21/2021  . DVT (deep venous thrombosis)        left leg  . High risk medication use 2018   Plaquenil    . HTN (hypertension)   . Hypoglycemia 01/23/2023  . Kidney stone 10/13/2021  . Obesity Not sure  . Pulmonary arterial hypertension       . Pulmonary emboli       . Type 2 diabetes mellitus without complication, without long-term current use of insulin      03/20/2022  . Venous insufficiency 05/31/2023  [2] Past Surgical History: Procedure Laterality Date  . CARDIAC CATHETERIZATION    . JOINT REPLACEMENT  2001  . PR BRNCHSC EBUS GUIDED SAMPL 3/> NODE STATION/STRUX N/A 07/02/2023   Procedure: BRONCH, RIGID OR FLEXIBLE, INCLUDING FLUORO GUIDANCE, WHEN  PERFORMED; W EBUS GUIDED TRANSTRACHEAL AND/OR TRANSBRONCHIAL SAMPLING, 3 OR MORE MEDIASTINAL AND/OR HILAR LYMPH NODE STATIONS OR STRUCTURES;  Surgeon: Carlus Morene DASEN, MD;  [facility];  Service: Pulmonary  . PR BRONCHOSCOPY,DIAGNOSTIC W LAVAGE N/A 07/02/2023   Procedure: BRONCHOSCOPY, RIGID OR FLEXIBLE, INCLUDE FLUOROSCOPIC GUIDANCE WHEN PERFORMED; W/BRONCHIAL ALVEOLAR LAVAGE;  Surgeon: Carlus Morene DASEN, MD;  [facility];  Service: Pulmonary  . PR NEEDLE BIOPSY, LYMPH NODE(S) N/A 07/02/2023   Procedure: BX/EXC LYMPH NODE; BY NEEDLE SUPERF;  Surgeon: Carlus Morene DASEN, MD;  [facility];  Service: Pulmonary  . PR RIGHT HEART CATH O2 SATURATION & CARDIAC OUTPUT N/A 04/07/2021   Procedure: Right Heart Catheterization W Intervention;  Surgeon: Fairy Glean Ship, MD;  Location[facility] C[facility];  Service: Cardiology  . PR RIGHT HEART CATH O2 SATURATION & CARDIAC OUTPUT  N/A 05/29/2021   Procedure: Right Heart Catheterization;  Surgeon: Ozell Laud, MD;  Location: [facility] CATH;  Service: Cardiology  . PR RIGHT HEART CATH O2 SATURATION & CARDIAC OUTPUT N/A 09/22/2022   Procedure: Right Heart Catheterization;  Surgeon: Jock Queen, MD;  Location: [facility] CATH;  Service: Cardiology  . Si joint repair     has 3 screws- due to injury  . TOTAL SHOULDER REPLACEMENT Left    due to injury  [3] Family History Problem Relation Age of Onset  . Diabetes Mother   . Arthritis Father        deformed hand but does not know the type of arthritis  . Hearing loss Father   . Cancer Paternal Grandfather        lung cancer  . No Known Problems Sister   . No Known Problems Brother   . No Known Problems Maternal Aunt   . No Known Problems Maternal Uncle   . No Known Problems Paternal Aunt   . No Known Problems Paternal Uncle   . No Known Problems Maternal Grandmother   . No Known Problems Maternal Grandfather   . No Known Problems Paternal Grandmother   . No Known  Problems Other   . Clotting disorder Sister   . Thyroid disease Sister   . Amblyopia Neg Hx   . Blindness Neg Hx   . Cataracts Neg Hx   . Glaucoma Neg Hx   . Hypertension Neg Hx   . Macular degeneration Neg Hx   . Retinal detachment Neg Hx   . Strabismus Neg Hx   . Stroke Neg Hx

## 2024-04-10 NOTE — Progress Notes (Signed)
 Provider: Corean JINNY Goldberg, MD Date: 04/10/2024 Patient: Hector Hancock Sex: male DOB: 1966-01-17 Age: 58 y.o.  Urology Clinic Note - New Patient Visit   PCP: Jama Mayo, MD  Referring Provider: Manuela Sharrell Dotter, *  Chief concern/reason for visit: gross hematuria  Assessment and Plan:   Assessment & Plan Gross hematuria 58 year old male with gross hematuria.   Cystoscopy: Cystoscopy today with buried penis/phimosis and slightly narrow meatus. Urethra/prostate/bladder without lesion seen.  Upper Tract Imaging: CT Urogram with bilateral non-obstructing stones up to 8mm on the left, 4mm on the right, no hydronephrosis bilaterally. No other upper tract etiology for gross hematuria identified Urine Cytology:  Negative for high grade urothelial carcinoma  Unclear etiology of gross hematuria- possibly from his non-obstructing stones (currently asymptomatic). Will continue to monitor. Benign prostatic hyperplasia with incomplete bladder emptying Lower urinary tract symptoms in the setting of obesity, Jardiance , torsemide  use. Tamsulosin  started last visit seems to be helping his urination without side effects, he would like to continue.  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 Difficult to expose the meatus for cystoscopy and unable to retract the foreskin to fully expose the glans.   I have offered him a topical steroid cream to attempt to improve the phimosis so that the foreskin can be retracted fully for urination, cleaning, and examination. I am concerned about the possibility of progressive phimosis and trapped urine (particularly with Jardiance  use) leading to infections or acute urinary retention.  He is not a good candidate for circumcision as his penis is fully buried. I worry about creating a larger issue of a circumcised penis scarring within his suprapubic fat pad. We did discuss option for dorsal slit, which  is a less cosmetic option, but may be necessary if the steroid cream does not improve the phimosis.   Provided instructions for topical steroid cream use. He will follow-up in approximately 2 months for a repeat exam.  Nephrolithiasis Bilateral nephrolithiasis seen on CTU, not currently obstructing.   Discussed with patient dietary prevention of their kidney stones.  Discussed the following:  1. Increased fluid intake, goal of 2.5 L of fluid daily.   2. Low sodium diet 3. Moderate calcium intake, 800 to 1200 mg daily, particularly at meal time 4. Attempt to minimize dietary oxalates, particularly the highest oxalate containing foods 5. Moderate animal protein, one 8 oz serving daily 6. Increase intake of citrate (lemon, crystal lite lemonade)  We can discuss surgical options at a later date- he is higher risk for elective surgery.      Orders Placed This Encounter  Procedures  . POCT urinalysis dipstick    Follow up: No follow-ups on file.  Future Appointments  Date Time Provider Department Center  04/19/2024  8:30 AM Loura Mariano CROME, MD [facility]  05/09/2024  3:40 PM Juli Leeroy Palma, MD [facility]  05/19/2024  4:00 PM Jama Mayo, MD [facility]  09/11/2024 10:20 AM Brock Odell Ned, MD [facility]  03/21/2025  2:00 PM Mooberry, Tereasa Marsh, MD [facility]   Subjective History of Presenting Illness   Hector Hancock is a 58 y.o. male here for evaluation and treatment of gross hematuria.    Hematuria first noted about a month ago.    He does have some urinary frequency and urgency - of note, he takes jardiance  and torsemide . The urinary symptoms did not start with the hematuria. He  also notes some incomplete bladder emptying sensation and some weakening of his stream. He also takes xarelto  for clotting history.  He returns for follow-up today for cystoscopy. CT Urogram showed stones but no other  significant upper tract findings. He reports a couple episodes of hematuria in the interim. He started taking the flomax  prescribed last visit, and this does seem to be helping him.    Hematuria risk factors   Genitourinary History:  Urolithiasis Hx: has passed a few, has had to have surgery in the past as well, last one 3 years ago Urinary infection: None GU Surgery: 5 years ago, ESWL once Chronic indwelling catheter: None Neurogenic bladder/SCI: None  Additional Risk Factors: Smoking/Tobacco Hx: He has never smoked. Occupational (tobacco, dyes, rubber, petrochem, weed killer, chimney, aluminum, hairdresser, nurses): None Environmental Exposure: None Chemotherapy: None Pelvic radiation: None Family Hx of UC or Lynch Sx: None    Tobacco Hx   Tobacco Use: Low Risk  (03/30/2024)   Patient History   . Smoking Tobacco Use: Never   . Smokeless Tobacco Use: Never   . Passive Exposure: Not on file   Medications   Current Outpatient Medications  Medication Instructions  . acetaminophen  (TYLENOL ) 650 mg, Oral, Every 4 hours PRN  . benazepril (LOTENSIN) 40 mg, Oral, Nightly  . blood sugar diagnostic (GLUCOSE BLOOD) Strp Disp test strips preferred by insurance plan. Testing QID, Dx:E11.65,Z79.4 (Type 2 DM- uncontrolled, insulin  dep)  . blood-glucose meter kit Disp. blood glucose meter kit preferred by patient's insurance. Dx: Diabetes, E11.9 OneTouch Meter  . budesonide-formoterol (SYMBICORT) 160-4.5 mcg/actuation inhaler Inhale 2 puffs twice daily everyday, and as needed for shortness of breath or wheezing, up to 12 puffs maximum per day. Always use with a spacer.  . cholecalciferol (vitamin D3 25 mcg (1,000 units)) (CHOLECALCIFEROL-25 MCG (1,000 UNIT)) 25 mcg, Oral, Daily (standard)  . colchicine (COLCRYS) 0.6 mg, Oral, Daily (standard), Can increase to twice a day if flaring.  . diphenhydrAMINE  (BENADRYL ) 25 mg, Nightly  . DULoxetine (CYMBALTA) 30 mg, Oral, Daily (standard)  .  empagliflozin  (JARDIANCE ) 25 mg, Oral, Every morning  . hydroxychloroquine  (PLAQUENIL ) 200 mg, Oral, 2 times a day (standard)  . inhalational spacing device (AEROCHAMBER MV) Spcr 1 each, Miscellaneous, 2 times a day  . lancets Misc Disp. lancets #100 or amount allowed, Testing Qday. Dx: E11.9,Z79.4 (DM- controlled, insulin  dep.)  . magnesium oxide (MAGOX) 400 mg, Oral, Daily (standard)  . metFORMIN (GLUCOPHAGE-XR) 500 MG 24 hr tablet PLEASE SEE ATTACHED FOR DETAILED DIRECTIONS  . potassium chloride  20 MEQ ER tablet 20 mEq, Oral, Daily (standard)  . pregabalin (LYRICA) 100 MG capsule Take 1 capsule by mouth in the morning as needed, and up to 2 capsules in the evening as needed (or ok to continue taking 1 capsule three times a day)  . rivaroxaban  (XARELTO ) 20 mg, Oral, Daily, Takes name brand Xarelto   . rosuvastatin (CRESTOR) 10 mg, Oral, Nightly  . semaglutide (OZEMPIC) 0.25 mg or 0.5 mg (2 mg/3 mL) PnIj Inject 0.25 mg under the skin every seven (7) days for 56 days, THEN 0.5 mg every seven (7) days.  . spironolactone  (ALDACTONE ) 50 mg, Oral, Daily (standard)  . sulfamethoxazole -trimethoprim  (BACTRIM  DS) 800-160 mg per tablet 160 mg of trimethoprim , Oral, 2 times a day (standard)  . tamsulosin  (FLOMAX ) 0.4 mg, Oral, Daily (standard)  . torsemide  (DEMADEX ) 20 mg, Oral, 2 times a day   Additional History  Past Medical History[1] Past Surgical History[2] Family History[3] Review of Systems  A 14 point review of systems was conducted and was negative except as stated in the HPI.  Objective Physical Examination:  BP 137/85 (BP Site: L Arm, BP Position: Sitting, BP Cuff Size: Large)   Pulse 91   Resp 17   Ht 182.9 cm (6')   Wt (!) 165.1 kg (364 lb)   SpO2 97%   BMI 49.37 kg/m   General: well-developed, well-nourished. In no acute distress. In a motorized wheelchair Head: Normocephalic and atraumatic.  Eyes: EOM are normal. No scleral icterus.  Neck: Normal range of motion. No  tracheal deviation present.  Abd: Obese Cardiovascular: Normal rate. Warm and well perfused Pulmonary/Chest: Effort normal. No labored breathing or accessory muscle use.  Neurological: Alert, cranial nerves in tact, no evidence of neurologic deficites  Skin: Skin is warm and dry.  Psychiatric: Normal mood and affect. Judgment normal. Appropriate     Labs   Results for orders placed or performed in visit on 04/10/24  POCT urinalysis dipstick  Result Value Ref Range   Color, UA Yellow    Clarity, UA Clear    Glucose, UA >=1000 mg/dL Negative   Bilirubin, UA Negative Negative   Ketones, POC Negative Negative   Spec Grav, UA 1.015 1.005 - 1.030   Blood, UA Trace-intact (A) Negative   pH, UA 5.5 5.0 - 9.0   Protein, UA 30 mg/dL (A) Negative   Urobilinogen, UA 0.2 E.U./dL Negative (0.2 mg/dL)   Leukocytes, UA Negative Negative   Nitrite, UA Negative Negative   STRIP LOT NUMBER [id number]    STRIP LOT EXPIRATION 03/16/2025     03/24/24- UA 100 RBC, 9 WBC, occasional bacteria, >1000 glucose. Culture mixed urogenital flora 03/24/24- Urine cytology - Negative for high grade urothelial carcinoma - Numerous red blood cells and scattered squamous cells with paired small cocci shaped bacteria present; see comment    Imaging   Impression - Bilateral nonobstructive nephrolithiasis.  - Circumferential bladder wall thickening which could be exaggerated due to under distention. Correlate with urinalysis.  - No other suspicious renal or urothelial lesions.  - Hepatic steatosis.   Narrative EXAM: CT ABDOMEN WITH AND WITHOUT CONTRAST, CT PELVIS WITH CONTRAST (CT UROGRAM PROTOCOL) ACCESSION: [id number] UN CLINICAL INDICATION: gross hematuria  - R31.0 - Gross hematuria    COMPARISON: CT pelvic venogram dated 06/28/2023  TECHNIQUE: A helical CT scan of the abdomen (without and with IV contrast) and pelvis (with IV contrast) was obtained. The abdomen was scanned in noncontrast,  nephrographic and urographic phases. The pelvis was scanned in the urographic phase. Images were reconstructed in the axial plane. Coronal and sagittal reformatted images of the abdomen and pelvis were also provided for further evaluation. 3D MIP reformats were also performed.  FINDINGS:  UROGRAM FINDINGS: Kidneys: Symmetric nephrograms. Bilateral renal calculi measuring up to 0.8 cm on the left and 0.4 cm on the right. No hydronephrosis. 1.2 cm right interpolar renal cyst. Additional subcentimeter hypodensities are too small to characterize. Symmetric perinephric stranding bilaterally.  Ureters: No ureteral stone. No ureteral dilatation. Ureters are partially opacified with contrast on delayed phase. Nonopacification of the distal ureters bilaterally likely due to peristalsis. No focal ureteral wall thickening.  Bladder: Under distended with circumferential bladder wall thickening.   OTHER  FINDINGS: LOWER CHEST: Unremarkable.  LIVER: Diffuse hypoattenuation of the liver compatible with hepatic steatosis. Focal fatty sparing along the gallbladder fossa. No focal hepatic lesions.  BILIARY: The gallbladder is normal in appearance. No biliary dilatation.    SPLEEN: Normal in  size and contour. Splenule.  PANCREAS: No focal masses or ductal dilatation.  ADRENALS: No adrenal nodules.  GI TRACT: No dilated or thick walled loops of bowel. Small duodenal diverticulum. Prominent rectal stool ball.  PERITONEUM/RETROPERITONEUM AND MESENTERY: No free air or fluid.  LYMPH NODES: Prominent portacaval lymph node measuring up to 1.2 cm which may be reactive (6:57). No other enlarged lymph nodes in the abdomen or pelvis.  VESSELS: The aorta is normal in caliber.  PELVIS: Coarse prostatic calcifications.  BONES AND SOFT TISSUES: No aggressive osseous lesions. Right sacroiliac screw fixation.     Office Procedure   Cystoscopy 04/10/24 dictated separately in procedure note          [1] Past Medical History: Diagnosis Date  . Acute exacerbation of CHF (congestive heart failure)     05/30/2021  . Arthritis   . CHF (congestive heart failure)     05/21/2021  . DVT (deep venous thrombosis)        left leg  . High risk medication use 2018   Plaquenil    . HTN (hypertension)   . Hypoglycemia 01/23/2023  . Kidney stone 10/13/2021  . Obesity Not sure  . Pulmonary arterial hypertension       . Pulmonary emboli       . Type 2 diabetes mellitus without complication, without long-term current use of insulin      03/20/2022  . Venous insufficiency 05/31/2023  [2] Past Surgical History: Procedure Laterality Date  . CARDIAC CATHETERIZATION    . JOINT REPLACEMENT  2001  . PR BRNCHSC EBUS GUIDED SAMPL 3/> NODE STATION/STRUX N/A 07/02/2023   Procedure: BRONCH, RIGID OR FLEXIBLE, INCLUDING FLUORO GUIDANCE, WHEN PERFORMED; W EBUS GUIDED TRANSTRACHEAL AND/OR TRANSBRONCHIAL SAMPLING, 3 OR MORE MEDIASTINAL AND/OR HILAR LYMPH NODE STATIONS OR STRUCTURES;  Surgeon: Carlus Morene DASEN, MD;  [facility];  Service: Pulmonary  . PR BRONCHOSCOPY,DIAGNOSTIC W LAVAGE N/A 07/02/2023   Procedure: BRONCHOSCOPY, RIGID OR FLEXIBLE, INCLUDE FLUOROSCOPIC GUIDANCE WHEN PERFORMED; W/BRONCHIAL ALVEOLAR LAVAGE;  Surgeon: Carlus Morene DASEN, MD;  [facility];  Service: Pulmonary  . PR NEEDLE BIOPSY, LYMPH NODE(S) N/A 07/02/2023   Procedure: BX/EXC LYMPH NODE; BY NEEDLE SUPERF;  Surgeon: Carlus Morene DASEN, MD;  [facility];  Service: Pulmonary  . PR RIGHT HEART CATH O2 SATURATION & CARDIAC OUTPUT N/A 04/07/2021   Procedure: Right Heart Catheterization W Intervention;  Surgeon: Fairy Glean Ship, MD;  Location[facility] C[facility];  Service: Cardiology  . PR RIGHT HEART CATH O2 SATURATION & CARDIAC OUTPUT N/A 05/29/2021   Procedure: Right Heart Catheterization;  Surgeon: Ozell Laud, MD;  Location: [facility] CATH;  Service: Cardiology  . PR RIGHT HEART CATH O2 SATURATION  & CARDIAC OUTPUT N/A 09/22/2022   Procedure: Right Heart Catheterization;  Surgeon: Jock Queen, MD;  Location: [facility] CATH;  Service: Cardiology  . Si joint repair     has 3 screws- due to injury  . TOTAL SHOULDER REPLACEMENT Left    due to injury  [3] Family History Problem Relation Age of Onset  . Diabetes Mother   . Arthritis Father        deformed hand but does not know the type of arthritis  . Hearing loss Father   . Cancer Paternal Grandfather        lung cancer  . No Known Problems Sister   . No Known Problems Brother   . No Known Problems Maternal Aunt   . No Known Problems Maternal Uncle   .  No Known Problems Paternal Aunt   . No Known Problems Paternal Uncle   . No Known Problems Maternal Grandmother   . No Known Problems Maternal Grandfather   . No Known Problems Paternal Grandmother   . No Known Problems Other   . Clotting disorder Sister   . Thyroid disease Sister   . Amblyopia Neg Hx   . Blindness Neg Hx   . Cataracts Neg Hx   . Glaucoma Neg Hx   . Hypertension Neg Hx   . Macular degeneration Neg Hx   . Retinal detachment Neg Hx   . Strabismus Neg Hx   . Stroke Neg Hx

## 2024-04-10 NOTE — Progress Notes (Signed)
 [facility] LOT [id number] EXP 09/23/2026

## 2024-05-19 NOTE — Progress Notes (Signed)
 ------------------------------------------------------------------------------- Attestation signed by Collie Nelwyn Agent, MD at 05/30/24 1010 I saw and evaluated the patient, participating in the key portions of the service.  I reviewed the resident's note.  I agree with the resident's findings and plan. Nelwyn JINNY Collie, MD  -------------------------------------------------------------------------------  Internal Medicine Clinic Visit  Reason for visit: Lymphedema, follow-up  A/P:     1. Type 2 diabetes mellitus with other specified complication, without long-term current use of insulin  (CMS-HCC)   2. Lymphedema [I89.0]   3. Pulmonary sarcoidosis (HHS-HCC)   4. Venous stasis   5. Class 3 severe obesity due to excess calories with serious comorbidity and body mass index (BMI) of 45.0 to 49.9 in adult (CMS-HCC)   6. Primary hypertension   7. Chronic heart failure with preserved ejection fraction (HFpEF) (CMS-HCC)    Acute on chronic HFpEF Severe bilateral Lymphedema with peripheral vascular disease (significantly decreased movement and sensation of left foot) w/ associated pain Dry weight ~350 lbs. Patient still with severe lymphedema of the BLE with chronic skin changes, decreased sensation/motion in the left foot, 1-2+ pitting edema on exam. Continues with 1.5L fluid restriction. We will wean off the Lyrica completely today. Hopeful that lymphedema leg pump will be able to help patient with his swelling and symptoms. - Torsemide  60 mg once per day - Spiro 50mg   - Patient has held Jardiance  25mg  daily due to frequent UTIs - continue to hold this as we uptitrate his GLP1a - Stop Pregabalin - Cymbalta 30mg  daily   Wt Readings from Last 12 Encounters:  05/19/24 (!) 165.1 kg (364 lb)  04/10/24 (!) 165.1 kg (364 lb)  03/30/24 (!) 157.9 kg (348 lb)  03/24/24 (!) 163.3 kg (360 lb)  03/21/24 (!) 161.9 kg (357 lb)  03/06/24 (!) 164 kg (361 lb 9.6 oz)  02/29/24 (!) 160.1 kg (353 lb)   02/28/24 (!) 163.3 kg (360 lb)  02/04/24 (!) 164.1 kg (361 lb 11.2 oz)  01/31/24 (!) 162.4 kg (358 lb 1.6 oz)  01/27/24 (!) 163.1 kg (359 lb 9.6 oz)  01/21/24 (!) 167.8 kg (370 lb)   Diabetes Poor glucose control. Last a1c 8.1 (01/21/24). Had possible syncopal episodes with higher doses of Ozempic, so will uptitrate gradually. - MTF 500 BID  - Rehceck A1c today - Ozempic 1mg  qw, recheck in 6 week follow up - May restart Jardiance  at next visit  Gross Hematuria  Recurrent UTI S/p cystoscopy w/ Uro w/o lesion. CT urogram w/ bilat non-obstructing stones up to 8mm on left, 4mm on right, no hydro. Urine cytology negative for high grade urothelial carcinoma. - CTM  BPH w/ Incomplete Bladder Emptying Flomax  0.4 daily  History of DVT  Continue Xarelto   Sarcoid Symbicort BID + PRN (twice per week)  Return in about 6 weeks (around 06/30/2024), or if symptoms worsen or fail to improve.  Staffed with Dr. Collie, seen and discussed  __________________________________________________________  HPI:  History of Present Illness Hector Hancock is a 58 year old male with lymphedema who presents for lymphedema and follow-up.  He uses compression stockings daily for his lymphedema. His leg pain has worsened, described as feeling like 'walking on a bubble' due to swelling.  He experiences variable breathing difficulties, with some days being worse than others. He reports that lying flat makes his breathing worse, so he uses a bed that elevates his head. He uses Symbicort twice a day and occasionally for rescue, approximately twice a week.  He is currently taking torsemide  60 mg once  a day, spironolactone  50 mg, and metformin 500 mg twice a day. He stopped taking Jardiance  due to concerns about bleeding in urine, which persisted even after discontinuation. He is also on Ozempic 0.5 mg once a week, which he tolerates well without gastrointestinal side effects. He monitors his fluid intake, aiming  for about 1.4 liters a day, but sometimes exceeds this due to thirst. He drinks 32 oz of water  and 20 oz of Gatorade per day.  He has a history of recurrent urinary tract infections and prior history of kidney stones, with recent episodes of hematuria. A urologist found no masses but noted kidney stones on both sides. The hematuria has varied in color from red to brown to yellow.  He has been taking pregabalin 100 mg once a day and Cymbalta for nerve pain, with mixed results. He reports fluctuations in his weight, with a dry weight around 348-350 pounds, but it can increase significantly due to fluid retention.  His blood sugar levels fluctuate, with morning readings sometimes as high as 280 mg/dL, but they generally decrease throughout the day. He checks his blood sugar multiple times a day and is running low on testing strips.   __________________________________________________________    Medications:  Reviewed in EPIC __________________________________________________________  Physical Exam:  Vital Signs: Vitals:   05/19/24 1600  BP: 120/92  BP Site: L Arm  BP Position: Sitting  BP Cuff Size: Large  Pulse: 99  Temp: 36.3 C (97.3 F)  TempSrc: Temporal  SpO2: 95%  Weight: (!) 165.1 kg (364 lb)  Height: 182.9 cm (6' 0.01)      PTHomeBP <redacted file path>  The patient's Average Home Blood Pressure during the last two weeks is :   /   based on  readings      Gen: Chronically ill appearing, NAD, obese CV: RRR, no murmurs Pulm: CTA bilaterally, no crackles or wheezes Ext: 1-2+ pitting edema, significant lymphedema of the BLE, dark-brown discoloration associated with chronic lymphedema, decreased monofilament sensation in the left foot, decreased movement of the left toes/foot   PHQ-9 Score:   GAD-7 Score:    Medication adherence and barriers to the treatment plan have been addressed. Opportunities to optimize healthy behaviors have been discussed. Patient /  caregiver voiced understanding.  Selinda Ruth, MD, PGY-1

## 2024-05-26 NOTE — Telephone Encounter (Signed)
-------------------------------------------------------------------------------   Summary: Lymphedema leg pump -------------------------------------------------------------------------------  Patient Name: Hector Hancock DOB: Jan 10, 1966 MRN: [id number]  Date of Encounter: Jun 05, 1966 Diagnosis: Lymphedema  The patient has chronic lymphedema with hyperpigmentation and after completing a 4-week trial of daily compression, elevation, and regular exercise. Their lymphedema has not improved, and they continue to experience symptoms such as hyperkeratosis and hyperpigmentation. Despite triaing the E0651 device, the results were not satisfactory. The patient will require an E0652 with the ability to manually program pressure and accommodate the patient's unique characteristics of significant pain due to swelling and fibrosis.   Measurements: Left ankle: 30.8cm Left calf: 49.2cm Right ankle: 30.1cm Right calf: 48.5cm  Selinda Ruth, MD

## 2024-06-06 NOTE — Progress Notes (Signed)
 Assessment and Plan Assessment & Plan Bilateral lower extremity lymphedema with venous stasis changes and chronic lower extremity edema Chronic bilateral lower extremity lymphedema with venous stasis changes and persistent edema. Previous trials of daily compression, leg elevation, regular exercise, and diuretics (torsemide  and spironolactone ) have not significantly reduced edema. Recent weight increase by 5 pounds, likely due to fluid retention. Compression therapy has been beneficial in reducing swelling and pain. Venous stasis changes with poor blood flow and skin changes. - Obtain compression devices for both legs as ordered by Dr. Jama - Submit note to insurance company for approval of compression devices  Chronic pain of bilateral lower extremities Chronic pain in bilateral lower extremities, severe and persistent, with a baseline pain level of 8 out of 10. Exacerbated by lymphedema and venous stasis. Pain management is challenging due to hydrocodone  side effects, causing drowsiness and impacting daily functioning. He prefers to manage pain without narcotics when possible.  Recurrent pulmonary embolism with chronic anticoagulation and chronic shortness of breath Recurrent pulmonary embolism with undetermined etiology, requiring lifelong anticoagulation. Chronic shortness of breath persists, attributed to previous pulmonary embolism damage. Oxygen levels remain stable, with no recent exacerbations or need for supplemental oxygen.  Hypertension Elevated blood pressure, likely secondary to pain and lack of sleep. Managed with benazepril.   The patient has chronic lymphedema with hyperpigmentation and after completing a 4-week trial of daily compression, elevation, and regular exercise. Their lymphedema has not improved, and they continue to experience symptoms such as hyperkeratosis and hyperpigmentation. Despite triaing the E0651 device, the results were not satisfactory. The patient will require  an E0652 with the ability to manually program pressure and accommodate the patient's unique characteristics of significant pain due to swelling and fibrosis.    Measurements: Left ankle: 30.8cm Left calf: 49.2cm Right ankle: 30.1cm Right calf: 48.5cm    Follow-up:   Reason for Visit:   Subjective  Hector Hancock is a 58 y.o. year old male  who presents for a follow up visit.  History of Present Illness Hector Hancock is a 58 year old male with lymphedema and a history of blood clots who presents for compression device fitting.  He has experienced lymphedema for a prolonged period, which began after developing blood clots. He has persistent leg pain, with a pain level consistently above five, often reaching eight. The pain is described as 'needles and burning' near the big toe and sharp pains radiating from the groin down the leg, sometimes fading around the knee. Various treatments have been tried, including therapy, which provided some relief, and daily compression socks, which are difficult to put on. He is on diuretics, specifically torsemide  and spironolactone , to manage fluid levels, but notes that his weight can fluctuate by five to ten pounds due to fluid retention.  Significant sleep disturbances are reported, with only two to two and a half hours of sleep due to leg pain, despite having a new adjustable bed. He experiences shortness of breath, attributed to previous blood clots, and uses a scooter for mobility. No new chest pain or changes in vision are noted.  He is on benazepril for blood pressure management, which was previously well-controlled. Hydrocodone  is taken for pain management but is avoided due to drowsiness the following day.  He has a history of blood clots, having been hospitalized twice, with the first incident requiring surgical intervention to remove clots from his lungs. Lifelong anticoagulation therapy is in place due to undetermined causes of the clots. Oxygen  levels  have generally been stable, except for one instance requiring oxygen support during hospitalization.  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 He expresses feelings of depression related to his inability to work and the significant reduction in income.   Meds and allergies were reviewed in Epic  Objective  Physical Exam Gen: Well appearing, No distress Vitals:   06/07/24 1008  BP: 146/92  BP Site: L Arm  BP Position: Sitting  BP Cuff Size: Large  Pulse: 90  Temp: 36.5 C (97.7 F)  TempSrc: Temporal  SpO2: 96%  Weight: (!) 162.1 kg (357 lb 6.4 oz)  Height: 182.9 cm (6' 0.01)   CV: RRR, no mrg Pulm: CTAB, nl work of breathing Ext: Bilateral lower extremity edema, venous stasis changes  Skin: warm and dry  Records review:

## 2024-06-09 NOTE — Telephone Encounter (Signed)
 Most recent visit notes from 06/08/2024 supporting use of Lymphedema device faxed and confirmed to [facility].

## 2024-06-12 NOTE — Progress Notes (Signed)
 Provider: Corean JINNY Goldberg, MD Date: 06/12/2024 Patient: Hector Hancock Sex: male DOB: 1966-08-15 Age: 58 y.o.  Urology Clinic Note - Return Patient Visit   PCP: Jama Mayo, MD  Referring Provider: Manuela Sharrell Dotter, *  Chief concern/reason for visit: Gross hematuria  Assessment and Plan:   Assessment & Plan Gross hematuria 58 year old male with gross hematuria.   Cystoscopy: Cystoscopy 04/10/24 with buried penis and slightly narrow meatus. Urethra/prostate/bladder without lesion seen.  Upper Tract Imaging: CT Urogram 04/02/24 with bilateral non-obstructing stones up to 8mm on the left, 4mm on the right, no hydronephrosis bilaterally. No other upper tract etiology for gross hematuria identified Urine Cytology:  03/28/24 Negative for high grade urothelial carcinoma    Unclear etiology of gross hematuria- possibly from his non-obstructing renal stones (currently asymptomatic other than the gross hematuria). Will continue to monitor. Nephrolithiasis Bilateral non-obstructing renal stones. With his body habitus, I am concerned with any elective stone surgery currently due to increased risks of general anesthesia and technical feasibility. We have discussed both ureteroscopy and shock wave lithotripsy. At this point, he strongly prefers to monitor the stones and only treat if there is growth or active symptoms. 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 On exam he has large pannus but the penis appears truly buried within the mons pubis. I am unable to retract the suprapubic fat to expose the penis for exam. We discussed the risks to include chronic inflammation (including risk of penile ca with this due to the inflammation and inability to examine), infections (particularly with jardiance ), and risk of urinary retention.  I will refer him to my colleague, Dr. Levonia, for evaluation and possible treatment of acquired buried penis. I was clear with the patient that weight loss is of  upmost importance and he may not be a surgical candidate.  Will set up follow-up in a few months as a back stop, we can adjust this appointment PRN.     Orders Placed This Encounter  Procedures  . Ambulatory referral to Urology  . POCT urinalysis dipstick    Follow up: No follow-ups on file.  Future Appointments  Date Time Provider Department Center  06/23/2024  2:15 PM Jesus Griffes, MD [facility]  07/06/2024  2:30 PM Jama Mayo, MD [facility]  09/06/2024 12:30 PM Sapp, Mariano CROME, MD [facility]  09/11/2024 10:20 AM Brock Odell Ned, MD [facility]  09/12/2024  1:00 PM Goldberg Corean Amble, MD [facility]  11/29/2024  2:40 PM Juli Leeroy Palma, MD [facility]  03/21/2025  2:00 PM Mooberry, Tereasa Marsh, MD [facility]   Subjective History of Presenting Illness   Hector Hancock is a 58 y.o. male here for evaluation and treatment of gross hematuria.    Hematuria first noted about a month ago.    He does have some urinary frequency and urgency - of note, he takes jardiance  and torsemide . The urinary symptoms did not start with the hematuria. He also notes some incomplete bladder emptying sensation and some weakening of his stream. He also takes xarelto  for clotting history.  04/10/24- He returns for follow-up today for cystoscopy. CT Urogram showed stones but no other significant upper tract findings. He reports a couple episodes of hematuria in the interim. He started taking the flomax  prescribed last visit, and this does seem to be helping him.   06/12/24 (today)- returns for re-evaluation. Has had a couple of episodes of  gross hematuria in the interim. No flank pain. The flomax  is definitely helping with his urinary stream. He tried using the topical steroid cream for phimosis but has not noticed any improvement.    Hematuria risk factors   Genitourinary History:   Urolithiasis Hx: has passed a few, has had to have surgery in the past as well, last one 3 years ago Urinary infection: None GU Surgery: 5 years ago, ESWL once Chronic indwelling catheter: None Neurogenic bladder/SCI: None  Additional Risk Factors: Smoking/Tobacco Hx: He has never smoked. Occupational (tobacco, dyes, rubber, petrochem, weed killer, chimney, aluminum, hairdresser, nurses): None Environmental Exposure: None Chemotherapy: None Pelvic radiation: None Family Hx of UC or Lynch Sx: None    Tobacco Hx   Tobacco Use: Low Risk  (06/12/2024)   Patient History   . Smoking Tobacco Use: Never   . Smokeless Tobacco Use: Never   . Passive Exposure: Not on file   Medications   Current Outpatient Medications  Medication Instructions  . acetaminophen  (TYLENOL ) 650 mg, Oral, Every 4 hours PRN  . benazepril (LOTENSIN) 40 mg, Oral, Nightly  . betamethasone dipropionate 0.05 % cream Topical, 2 times a day (standard), Apply to the inside of the foreskin and retract foreskin twice daily gently.  . blood sugar diagnostic (GLUCOSE BLOOD) Strp Check blood sugars once daily as instructed by clinic. Dispense per insurance coverage. ICD-10 E11.9  . blood-glucose meter kit Disp. blood glucose meter kit preferred by patient's insurance. Dx: Diabetes, E11.9 OneTouch Meter  . budesonide-formoterol (SYMBICORT) 160-4.5 mcg/actuation inhaler Inhale 2 puffs twice daily everyday, and as needed for shortness of breath or wheezing, up to 12 puffs maximum per day. Always use with a spacer.  . cholecalciferol (vitamin D3 25 mcg (1,000 units)) (CHOLECALCIFEROL-25 MCG (1,000 UNIT)) 25 mcg, Oral, Daily (standard)  . colchicine (COLCRYS) 0.6 mg, Oral, Daily (standard), Can increase to twice a day if flaring.  . diphenhydrAMINE  (BENADRYL ) 25 mg, Nightly  . DULoxetine (CYMBALTA) 30 mg, Oral, Daily (standard)  . empagliflozin  (JARDIANCE ) 25 mg, Oral, Every morning  . inhalational spacing device (AEROCHAMBER MV)  Spcr 1 each, Miscellaneous, 2 times a day  . lancets Misc Disp. lancets #100 or amount allowed, Testing Qday. Dx: E11.9,Z79.4 (DM- controlled, insulin  dep.)  . magnesium oxide (MAGOX) 400 mg, Oral, Daily (standard)  . metFORMIN (GLUCOPHAGE-XR) 500 MG 24 hr tablet PLEASE SEE ATTACHED FOR DETAILED DIRECTIONS  . OZEMPIC 1 mg, Subcutaneous, Every 7 days  . pregabalin (LYRICA) 100 MG capsule Take 1 capsule by mouth in the morning as needed, and up to 2 capsules in the evening as needed (or ok to continue taking 1 capsule three times a day)  . rivaroxaban  (XARELTO ) 20 mg, Oral, Daily, Takes name brand Xarelto   . rosuvastatin (CRESTOR) 10 mg, Oral, Nightly  . spironolactone  (ALDACTONE ) 50 mg, Oral, Daily (standard)  . tamsulosin  (FLOMAX ) 0.4 mg, Oral, Daily (standard)  . torsemide  (DEMADEX ) 20 mg, Oral, 2 times a day   Additional History  Past Medical History[1] Past Surgical History[2] Family History[3] Review of Systems   A 14 point review of systems was conducted and was negative except as stated in the HPI.  Objective Physical Examination:  BP 142/83 (BP Site: L Arm, BP Position: Sitting, BP Cuff Size: Large)   Pulse 96   Wt (!) 161.9 kg (357 lb)   SpO2 96%   BMI 48.41 kg/m   General: well-developed, well-nourished. In no acute distress. In a motorized wheelchair Head: Normocephalic and atraumatic.  Eyes: EOM  are normal. No scleral icterus.  Neck: Normal range of motion. No tracheal deviation present.  Abd: Obese Cardiovascular: Normal rate. Warm and well perfused Pulmonary/Chest: Effort normal. No labored breathing or accessory muscle use.  Neurological: Alert, cranial nerves in tact, no evidence of neurologic deficites  Skin: Skin is warm and dry.  Psychiatric: Normal mood and affect. Judgment normal. Appropriate GU: The penis is fully buried within the mons pubis. There is a cicatrix. Even with downward pressure on the suprapubic fat pad and trapping the penis, the foreskin was  unable to be retracted to visualize and examine the glans.     Labs   Results for orders placed or performed in visit on 06/12/24  POCT urinalysis dipstick  Result Value Ref Range   Color, UA Yellow    Clarity, UA Clear    Glucose, UA >=1000 mg/dL Negative   Bilirubin, UA Negative Negative   Ketones, POC Trace (A) Negative   Spec Grav, UA >=1.030 (A) 1.005 - 1.030   Blood, UA Negative Negative   pH, UA 5.5 5.0 - 9.0   Protein, UA 100 mg/dL (A) Negative   Urobilinogen, UA 0.2 E.U./dL Negative (0.2 mg/dL)   Leukocytes, UA Negative Negative   Nitrite, UA Negative Negative   STRIP LOT NUMBER [id number]    STRIP LOT EXPIRATION 7.31.2026     03/24/24- UA 100 RBC, 9 WBC, occasional bacteria, >1000 glucose. Culture mixed urogenital flora 03/24/24- Urine cytology - Negative for high grade urothelial carcinoma - Numerous red blood cells and scattered squamous cells with paired small cocci shaped bacteria present; see comment    Imaging   Personally reviewed and reviewed with the patient his CT Urogram  from 04/02/24  Impression - Bilateral nonobstructive nephrolithiasis.  - Circumferential bladder wall thickening which could be exaggerated due to under distention. Correlate with urinalysis.  - No other suspicious renal or urothelial lesions.  - Hepatic steatosis.   Narrative EXAM: CT ABDOMEN WITH AND WITHOUT CONTRAST, CT PELVIS WITH CONTRAST (CT UROGRAM PROTOCOL) ACCESSION: [id number] UN CLINICAL INDICATION: gross hematuria  - R31.0 - Gross hematuria    COMPARISON: CT pelvic venogram dated 06/28/2023    FINDINGS:  UROGRAM FINDINGS: Kidneys: Symmetric nephrograms. Bilateral renal calculi measuring up to 0.8 cm on the left and 0.4 cm on the right. No hydronephrosis. 1.2 cm right interpolar renal cyst. Additional subcentimeter hypodensities are too small to characterize. Symmetric perinephric stranding bilaterally.  Ureters: No ureteral stone. No ureteral dilatation. Ureters  are partially opacified with contrast on delayed phase. Nonopacification of the distal ureters bilaterally likely due to peristalsis. No focal ureteral wall thickening.  Bladder: Under distended with circumferential bladder wall thickening.   OTHER  FINDINGS: LOWER CHEST: Unremarkable.  LIVER: Diffuse hypoattenuation of the liver compatible with hepatic steatosis. Focal fatty sparing along the gallbladder fossa. No focal hepatic lesions.  BILIARY: The gallbladder is normal in appearance. No biliary dilatation.    SPLEEN: Normal in size and contour. Splenule.  PANCREAS: No focal masses or ductal dilatation.  ADRENALS: No adrenal nodules.  GI TRACT: No dilated or thick walled loops of bowel. Small duodenal diverticulum. Prominent rectal stool ball.  PERITONEUM/RETROPERITONEUM AND MESENTERY: No free air or fluid.  LYMPH NODES: Prominent portacaval lymph node measuring up to 1.2 cm which may be reactive (6:57). No other enlarged lymph nodes in the abdomen or pelvis.  VESSELS: The aorta is normal in caliber.  PELVIS: Coarse prostatic calcifications.  BONES AND SOFT TISSUES: No aggressive osseous lesions. Right sacroiliac screw fixation.  Office Procedure   Cystoscopy 04/10/24   Findings: Penis buried Urethra: Normal, meatus slightly narrow  Prostate: Normal  Bladder: Normal mucosa without tumor  Ureteral orifices:      -Right Normal seen on retroflex    -Left Normal seen on retroflex  Other findings: None            [1] Past Medical History: Diagnosis Date  . Acute exacerbation of CHF (congestive heart failure) (CMS-HCC) 05/30/2021  . Arthritis   . CHF (congestive heart failure) (CMS-HCC) 05/21/2021  . Clotting disorder (HHS-HCC)   . DVT (deep venous thrombosis) (CMS-HCC)    left leg  . Gait abnormality   . High risk medication use 2018   Plaquenil    . HTN (hypertension)   . Hypoglycemia 01/23/2023  . Kidney stone 10/13/2021  . Neuromuscular disorder  (CMS-HCC)   . Obesity Not sure  . Pulmonary arterial hypertension (CMS-HCC)   . Pulmonary emboli    (CMS-HCC)   . Sarcoidosis   . Type 2 diabetes mellitus without complication, without long-term current use of insulin  (CMS-HCC) 03/20/2022  . Venous insufficiency 05/31/2023  [2] Past Surgical History: Procedure Laterality Date  . CARDIAC CATHETERIZATION    . JOINT REPLACEMENT  2001  . PR BRNCHSC EBUS GUIDED SAMPL 3/> NODE STATION/STRUX N/A 07/02/2023   Procedure: BRONCH, RIGID OR FLEXIBLE, INCLUDING FLUORO GUIDANCE, WHEN PERFORMED; W EBUS GUIDED TRANSTRACHEAL AND/OR TRANSBRONCHIAL SAMPLING, 3 OR MORE MEDIASTINAL AND/OR HILAR LYMPH NODE STATIONS OR STRUCTURES;  Surgeon: Carlus Morene DASEN, MD;  [facility];  Service: Pulmonary  . PR BRONCHOSCOPY,DIAGNOSTIC W LAVAGE N/A 07/02/2023   Procedure: BRONCHOSCOPY, RIGID OR FLEXIBLE, INCLUDE FLUOROSCOPIC GUIDANCE WHEN PERFORMED; W/BRONCHIAL ALVEOLAR LAVAGE;  Surgeon: Carlus Morene DASEN, MD;  [facility];  Service: Pulmonary  . PR NEEDLE BIOPSY, LYMPH NODE(S) N/A 07/02/2023   Procedure: BX/EXC LYMPH NODE; BY NEEDLE SUPERF;  Surgeon: Carlus Morene DASEN, MD;  [facility];  Service: Pulmonary  . PR RIGHT HEART CATH O2 SATURATION & CARDIAC OUTPUT N/A 04/07/2021   Procedure: Right Heart Catheterization W Intervention;  Surgeon: Fairy Glean Ship, MD;  Location: [facility] CATH;  Service: Cardiology  . PR RIGHT HEART CATH O2 SATURATION & CARDIAC OUTPUT N/A 05/29/2021   Procedure: Right Heart Catheterization;  Surgeon: Ozell Laud, MD;  Location: [facility] CATH;  Service: Cardiology  . PR RIGHT HEART CATH O2 SATURATION & CARDIAC OUTPUT N/A 09/22/2022   Procedure: Right Heart Catheterization;  Surgeon: Jock Queen, MD;  Location: [facility] CATH;  Service: Cardiology  . Si joint repair     has 3 screws- due to injury  . TOTAL SHOULDER REPLACEMENT Left    due to injury  [3] Family History Problem Relation Age of Onset  .  Diabetes Mother   . Arthritis Father        deformed hand but does not know the type of arthritis  . Hearing loss Father   . Cancer Paternal Grandfather        lung cancer  . No Known Problems Sister   . No Known Problems Brother   . No Known Problems Maternal Aunt   . No Known Problems Maternal Uncle   . No Known Problems Paternal Aunt   . No Known Problems Paternal Uncle   . No Known Problems Maternal Grandmother   . No Known Problems Maternal Grandfather   . No Known Problems Paternal Grandmother   . No Known Problems Other   . Clotting disorder Sister   .  Thyroid disease Sister   . Diabetes Sister   . Amblyopia Neg Hx   . Blindness Neg Hx   . Cataracts Neg Hx   . Glaucoma Neg Hx   . Hypertension Neg Hx   . Macular degeneration Neg Hx   . Retinal detachment Neg Hx   . Strabismus Neg Hx   . Stroke Neg Hx

## 2024-06-22 ENCOUNTER — Emergency Department

## 2024-06-22 ENCOUNTER — Observation Stay
Admission: EM | Admit: 2024-06-22 | Discharge: 2024-06-23 | Disposition: A | Discharge status: Home / Self Care | Attending: Emergency Medicine | Admitting: Emergency Medicine

## 2024-06-22 ENCOUNTER — Other Ambulatory Visit: Payer: Self-pay

## 2024-06-22 DIAGNOSIS — I10 Essential (primary) hypertension: Secondary | ICD-10-CM | POA: Insufficient documentation

## 2024-06-22 DIAGNOSIS — I509 Heart failure, unspecified: Secondary | ICD-10-CM | POA: Diagnosis not present

## 2024-06-22 DIAGNOSIS — G459 Transient cerebral ischemic attack, unspecified: Secondary | ICD-10-CM | POA: Diagnosis not present

## 2024-06-22 DIAGNOSIS — E669 Obesity, unspecified: Secondary | ICD-10-CM | POA: Diagnosis not present

## 2024-06-22 DIAGNOSIS — G8192 Hemiplegia, unspecified affecting left dominant side: Secondary | ICD-10-CM | POA: Diagnosis present

## 2024-06-22 DIAGNOSIS — R4789 Other speech disturbances: Secondary | ICD-10-CM | POA: Diagnosis not present

## 2024-06-22 DIAGNOSIS — Z7901 Long term (current) use of anticoagulants: Secondary | ICD-10-CM | POA: Diagnosis not present

## 2024-06-22 DIAGNOSIS — Z6841 Body Mass Index (BMI) 40.0 and over, adult: Secondary | ICD-10-CM | POA: Diagnosis not present

## 2024-06-22 DIAGNOSIS — R531 Weakness: Principal | ICD-10-CM

## 2024-06-22 DIAGNOSIS — Z86718 Personal history of other venous thrombosis and embolism: Secondary | ICD-10-CM | POA: Insufficient documentation

## 2024-06-22 LAB — COMPREHENSIVE METABOLIC PANEL WITH GFR
ALT: 39 U/L (ref 0–44)
AST: 41 U/L (ref 15–41)
Albumin: 3.8 g/dL (ref 3.5–5.0)
Alkaline Phosphatase: 62 U/L (ref 38–126)
Anion gap: 12 (ref 5–15)
BUN: 14 mg/dL (ref 6–20)
CO2: 21 mmol/L — ABNORMAL LOW (ref 22–32)
Calcium: 8.7 mg/dL — ABNORMAL LOW (ref 8.9–10.3)
Chloride: 103 mmol/L (ref 98–111)
Creatinine, Ser: 0.91 mg/dL (ref 0.61–1.24)
GFR, Estimated: >60 mL/min (ref >60)
Glucose, Bld: 209 mg/dL — ABNORMAL HIGH (ref 70–99)
Potassium: 3.8 mmol/L (ref 3.5–5.1)
Sodium: 136 mmol/L (ref 135–145)
Total Bilirubin: 0.9 mg/dL (ref 0.0–1.2)
Total Protein: 7.5 g/dL (ref 6.5–8.1)

## 2024-06-22 LAB — PROTIME-INR
INR: 1.4 — ABNORMAL HIGH (ref 0.8–1.2)
Prothrombin Time: 18.1 s — ABNORMAL HIGH (ref 11.4–15.2)

## 2024-06-22 LAB — CBC
HCT: 46.2 % (ref 39.0–52.0)
Hemoglobin: 15.2 g/dL (ref 13.0–17.0)
MCH: 29.6 pg (ref 26.0–34.0)
MCHC: 32.9 g/dL (ref 30.0–36.0)
MCV: 89.9 fL (ref 80.0–100.0)
Platelets: 197 K/uL (ref 150–400)
RBC: 5.14 MIL/uL (ref 4.22–5.81)
RDW: 13.2 % (ref 11.5–15.5)
WBC: 5.9 K/uL (ref 4.0–10.5)
nRBC: 0 % (ref 0.0–0.2)

## 2024-06-22 LAB — URINE DRUG SCREEN, QUALITATIVE (ARMC ONLY)
Amphetamines, Ur Screen: NOT DETECTED
Barbiturates, Ur Screen: NOT DETECTED
Benzodiazepine, Ur Scrn: NOT DETECTED
Cannabinoid 50 Ng, Ur ~~LOC~~: NOT DETECTED
Cocaine Metabolite,Ur ~~LOC~~: NOT DETECTED
MDMA (Ecstasy)Ur Screen: NOT DETECTED
Methadone Scn, Ur: NOT DETECTED
Opiate, Ur Screen: POSITIVE — AB
Phencyclidine (PCP) Ur S: NOT DETECTED
Tricyclic, Ur Screen: NOT DETECTED

## 2024-06-22 LAB — DIFFERENTIAL
Abs Immature Granulocytes: 0.02 K/uL (ref 0.00–0.07)
Basophils Absolute: 0 K/uL (ref 0.0–0.1)
Basophils Relative: 0 %
Eosinophils Absolute: 0.1 K/uL (ref 0.0–0.5)
Eosinophils Relative: 2 %
Immature Granulocytes: 0 %
Lymphocytes Relative: 24 %
Lymphs Abs: 1.4 K/uL (ref 0.7–4.0)
Monocytes Absolute: 0.4 K/uL (ref 0.1–1.0)
Monocytes Relative: 7 %
Neutro Abs: 3.9 K/uL (ref 1.7–7.7)
Neutrophils Relative %: 67 %

## 2024-06-22 LAB — APTT: aPTT: 34 s (ref 24–36)

## 2024-06-22 LAB — HEMOGLOBIN A1C
Hgb A1c MFr Bld: 7.9 % — ABNORMAL HIGH (ref 4.8–5.6)
Mean Plasma Glucose: 180.03 mg/dL

## 2024-06-22 LAB — ETHANOL: Alcohol, Ethyl (B): <15 mg/dL (ref <15)

## 2024-06-22 LAB — CBG MONITORING, ED: Glucose-Capillary: 202 mg/dL — ABNORMAL HIGH (ref 70–99)

## 2024-06-22 MED ORDER — IOHEXOL 350 MG/ML SOLN
100.0000 mL | Freq: Once | INTRAVENOUS | Status: AC | PRN
  Administered 2024-06-22: 100 mL via INTRAVENOUS

## 2024-06-22 MED ORDER — ROSUVASTATIN CALCIUM 10 MG PO TABS
10.0000 mg | ORAL_TABLET | Freq: Every day | ORAL | Status: DC
  Filled 2024-06-22: qty 1

## 2024-06-22 MED ORDER — ACETAMINOPHEN 650 MG RE SUPP
650.0000 mg | RECTAL | Status: DC | PRN
  Administered 2024-06-23: 650 mg via RECTAL
  Filled 2024-06-22: qty 1

## 2024-06-22 MED ORDER — ACETAMINOPHEN 160 MG/5ML PO SOLN: 650.0000 mg | ORAL | Status: DC | PRN

## 2024-06-22 MED ORDER — BUTALBITAL-APAP-CAFFEINE 50-325-40 MG PO TABS
1.0000 | ORAL_TABLET | Freq: Four times a day (QID) | ORAL | Status: DC | PRN
  Administered 2024-06-23: 2 via ORAL
  Filled 2024-06-22 (×2): qty 2

## 2024-06-22 MED ORDER — ENALAPRIL MALEATE 10 MG PO TABS: 5.0000 mg | ORAL_TABLET | Freq: Every day | ORAL | Status: DC

## 2024-06-22 MED ORDER — RIVAROXABAN 20 MG PO TABS
20.0000 mg | ORAL_TABLET | Freq: Every day | ORAL | Status: DC
  Administered 2024-06-23: 20 mg via ORAL
  Filled 2024-06-22: qty 1

## 2024-06-22 MED ORDER — STROKE: EARLY STAGES OF RECOVERY BOOK: Freq: Once | Status: AC

## 2024-06-22 MED ORDER — ACETAMINOPHEN 325 MG PO TABS: 650.0000 mg | ORAL_TABLET | ORAL | Status: DC | PRN

## 2024-06-22 MED ORDER — MORPHINE SULFATE (PF) 4 MG/ML IV SOLN
4.0000 mg | Freq: Once | INTRAVENOUS | Status: AC
  Administered 2024-06-22: 4 mg via INTRAVENOUS
  Filled 2024-06-22: qty 1

## 2024-06-22 MED ORDER — SPIRONOLACTONE 25 MG PO TABS
50.0000 mg | ORAL_TABLET | Freq: Every day | ORAL | Status: DC
  Administered 2024-06-23: 50 mg via ORAL
  Filled 2024-06-22: qty 2

## 2024-06-22 MED ORDER — SODIUM CHLORIDE 0.9% FLUSH
3.0000 mL | Freq: Once | INTRAVENOUS | Status: AC
  Administered 2024-06-22: 3 mL via INTRAVENOUS

## 2024-06-22 NOTE — ED Notes (Signed)
 MD at bedside.

## 2024-06-22 NOTE — Progress Notes (Signed)
  Chaplain On-Call responded to Code Stroke notification to the Emergency Department room 13.  The patient was at the CT area for tests.  Chaplain assured Staff of availability as needed.  Chaplain Bebe Ardean EMERSON Hershal., [facility]

## 2024-06-22 NOTE — Code Documentation (Addendum)
 Stroke Response Nurse Documentation Code Documentation  Hector Hancock is a 58 y.o. male arriving to [facility] via [facility] on 06/22/2024 with past medical hx of HTN. On Xarelto  (rivaroxaban ) daily. Code stroke was activated by ED.   Patient from home where he was LKW at 0300 and now complaining of left facial droop and difficulty speaking. Patient was at baseline before going to bed at 0300. Wife noticed that patient had a facial droop this morning and called 911.   Stroke team at the bedside on patient arrival. Labs drawn and patient cleared for CT by Dr. Claudene. Patient to CT with team. NIHSS 7, see documentation for details and code stroke times. Patient with disoriented, left facial droop, bilateral arm weakness, bilateral leg weakness, right decreased sensation, and dysarthria  on exam. The following imaging was completed:  CT Head, CTA, and CTP. Patient is not a candidate for IV Thrombolytic due to outside window. Patient is not a candidate for IR due to no LVO on imaging, per MD.   Care Plan: every 2 hour NIHSS, VS per order. Swallow screen per order.   Bedside handoff with ED RN Harlene PHEBE Burnard KANDICE Hershel  Stroke Response RN

## 2024-06-22 NOTE — ED Provider Notes (Signed)
 [facility] Provider Note    Event Date/Time   First MD Initiated Contact with Patient 06/22/24 1244     (approximate)   History   Weakness   HPI  Hector Hancock is a 58 y.o. male who presents to the ED for evaluation of Weakness   Reviewed PCP visit from 2 weeks ago.  Morbidly obese patient with history of VTE on Xarelto .  Patient presents to the ED concerns for weakness and facial droop, difficulty speaking over the past few hours.  Uncertain last known normal.  Reports going to bed at 3 AM and felt fine and then.    Physical Exam   Triage Vital Signs: ED Triage Vitals  Encounter Vitals Group     BP 06/22/24 1340 (!) 170/117     Girls Systolic BP Percentile --      Girls Diastolic BP Percentile --      Boys Systolic BP Percentile --      Boys Diastolic BP Percentile --      Pulse Rate 06/22/24 1340 87     Resp 06/22/24 1340 (!) 26     Temp 06/22/24 1358 (!) 97.5 F (36.4 C)     Temp src --      SpO2 06/22/24 1340 94 %     Weight 06/22/24 1237 (!) 362 lb (164.2 kg)     Height 06/22/24 1237 6' 1 (1.854 m)     Head Circumference --      Peak Flow --      Pain Score 06/22/24 1358 6     Pain Loc --      Pain Education --      Exclude from Growth Chart --     Most recent vital signs: Vitals:   06/22/24 1445 06/22/24 1500  BP: 124/81 (!) 128/90  Pulse: 83 81  Resp: (!) 31 (!) 27  Temp:    SpO2: 95% 96%    General: Awake, no distress.  Facial asymmetry CV:  Good peripheral perfusion.  Resp:  Normal effort.  Abd:  No distention.  MSK:  No deformity noted.  Neuro:  Tremulous right upper extremity, decreased strength with elevating the arm and compared to the left.  Right leg mildly weak when compared to the left. Other:     ED Results / Procedures / Treatments   Labs (all labs ordered are listed, but only abnormal results are displayed) Labs Reviewed  PROTIME-INR - Abnormal; Notable for the following components:      Result  Value   Prothrombin Time 18.1 (*)    INR 1.4 (*)    All other components within normal limits  COMPREHENSIVE METABOLIC PANEL WITH GFR - Abnormal; Notable for the following components:   CO2 21 (*)    Glucose, Bld 209 (*)    Calcium 8.7 (*)    All other components within normal limits  CBG MONITORING, ED - Abnormal; Notable for the following components:   Glucose-Capillary 202 (*)    All other components within normal limits  APTT  CBC  DIFFERENTIAL  ETHANOL  URINE DRUG SCREEN, QUALITATIVE ([facility] ONLY)  I-STAT CREATININE, ED    EKG   RADIOLOGY CT head interpreted by me without evidence of acute intracranial pathology  Official radiology report(s): CT ANGIO HEAD NECK W WO CM W PERF (CODE STROKE) LKW > 6h Result Date: 06/22/2024 EXAM: CTA Head and Neck with Perfusion 06/22/2024 01:06:33 PM TECHNIQUE: CTA of the head and neck was performed  with and without the administration of 100 mL of intravenous iohexol  (OMNIPAQUE ) 350 MG/ML injection. 3D postprocessing with multiplanar reconstructions and MIPs was performed to evaluate the vascular anatomy. Cerebral perfusion analysis using computed tomography with contrast administration, including post-processing of parametric maps with determination of cerebral blood flow, cerebral blood volume, mean transit time and time-to-maximum. Automated exposure control, iterative reconstruction, and/or weight based adjustment of the mA/kV was utilized to reduce the radiation dose to as low as reasonably achievable. Relatively faint contrast bolus is present resulting in decreased conspicuity of distal MCA branch vessels. COMPARISON: None available CLINICAL HISTORY: Neuro deficit, acute, stroke suspected. Left sided weakness and facial droop. Bilateral leg weakness. Expressive aphasia. FINDINGS: CTA NECK: AORTIC ARCH AND ARCH VESSELS: Atherosclerotic calcifications are present in the distal aortic arch without aneurysm or stenosis. No dissection or arterial  injury. No significant stenosis of the brachiocephalic or subclavian arteries. CERVICAL CAROTID ARTERIES: Atherosclerotic calcifications are present at the right carotid bifurcation, right greater than left carotid bifurcation, without significant stenosis relative to the more distal vessel. No dissection or arterial injury. CERVICAL VERTEBRAL ARTERIES: The vertebral arteries are codominant. No dissection, arterial injury, or significant stenosis. LUNGS AND MEDIASTINUM: Unremarkable. SOFT TISSUES: No acute abnormality. BONES: No acute abnormality. CTA HEAD: ANTERIOR CIRCULATION: Relatively faint contrast bolus is present resulting in decreased conspicuity of distal MCA branch vessels. No significant stenosis of the internal carotid arteries. No significant stenosis of the anterior cerebral arteries. No significant stenosis of the middle cerebral arteries. No aneurysm. POSTERIOR CIRCULATION: No significant stenosis of the posterior cerebral arteries. No significant stenosis of the basilar artery. No significant stenosis of the vertebral arteries. No aneurysm. OTHER: No dural venous sinus thrombosis on this non-dedicated study. CT PERFUSION: EXAM QUALITY: Exam quality is adequate with diagnostic perfusion maps. No significant motion artifact. Appropriate arterial inflow and venous outflow curves. CORE INFARCT (CBF<30% volume): 0 mL TOTAL HYPOPERFUSION (Tmax>6s volume): 0 mL PENUMBRA: Mismatch volume: 0 mL Mismatch ratio: not applicable Location: not applicable IMPRESSION: 1. No acute large vessel occlusion. 2. No hemodynamically significant stenosis or aneurysm in the head or neck vessels. 3. No evidence of ischemia on CT brain perfusion. Electronically signed by: Lonni Necessary MD 06/22/2024 01:31 PM EST RP Workstation: [id number]   CT HEAD CODE STROKE WO CONTRAST Result Date: 06/22/2024 EXAM: CT HEAD WITHOUT CONTRAST 06/22/2024 01:00:01 PM TECHNIQUE: CT of the head was performed without the administration of  intravenous contrast. Automated exposure control, iterative reconstruction, and/or weight based adjustment of the mA/kV was utilized to reduce the radiation dose to as low as reasonably achievable. COMPARISON: Brain MRI 06/19/2014, Head CT 06/18/2014. CLINICAL HISTORY: 58 year old male with acute neuro deficit, stroke suspected. FINDINGS: BRAIN AND VENTRICLES: No acute hemorrhage. No evidence of acute infarct. Cerebral volume remains normal for age. Indistinct new hypodensity at the anterior limb of the left internal capsule on series 3 image 20 compatible with age indeterminate small vessel disease. Although the abnormality is fairly circumscribed on coronal images (series 5 image 27) suggesting a chronic time course. Elsewhere gray white differentiation is within normal limits. No hydrocephalus. No extra-axial collection. No mass effect or midline shift. No suspicious intracranial vascular hyperdensity. ORBITS: No acute abnormality. No gaze deviation. SINUSES: Paranasal sinuses, mastoids, and middle ear cavities remain well aerated. SOFT TISSUES AND SKULL: No acute soft tissue abnormality. No skull fracture. [place] stroke program early CT score (aspects) ----- Ganglionic (caudate, ic, lentiform nucleus, insula, M1-m3): 7 Supraganglionic (m4-m6): 3 Total: 10 IMPRESSION: 1. No acute intracranial abnormality.  ASPECTS 10. 2. left internal capsule small vessel disease, likely chronic. 3. These results were communicated to Dr. Matthews at 1305 hours on 06/22/2024 by text page via the [facility] messaging system. Electronically signed by: Helayne Hurst MD 06/22/2024 01:06 PM EST RP Workstation: [id number]    PROCEDURES and INTERVENTIONS:  .Critical Care  Performed by: Claudene Rover, MD Authorized by: Claudene Rover, MD   Critical care provider statement:    Critical care time (minutes):  30   Critical care time was exclusive of:  Separately billable procedures and treating other patients   Critical care was necessary to  treat or prevent imminent or life-threatening deterioration of the following conditions:  CNS failure or compromise   Critical care was time spent personally by me on the following activities:  Development of treatment plan with patient or surrogate, discussions with consultants, evaluation of patient's response to treatment, examination of patient, ordering and review of laboratory studies, ordering and review of radiographic studies, ordering and performing treatments and interventions, pulse oximetry, re-evaluation of patient's condition and review of old charts   Medications  morphine  (PF) 4 MG/ML injection 4 mg (has no administration in time range)  sodium chloride  flush (NS) 0.9 % injection 3 mL (3 mLs Intravenous Given 06/22/24 1345)  iohexol  (OMNIPAQUE ) 350 MG/ML injection 100 mL (100 mLs Intravenous Contrast Given 06/22/24 1259)     IMPRESSION / MDM / ASSESSMENT AND PLAN / ED COURSE  I reviewed the triage vital signs and the nursing notes.  Differential diagnosis includes, but is not limited to, stroke, seizure, TIA, metabolic encephalopathy  {Patient presents with symptoms of an acute illness or injury that is potentially life-threatening.  Patient presents to the ED with weakness concerning for possible acute stroke.  Stroke protocol was activated and consult with neurology.  CT imaging reassuring without ICH, no LVO, MRI pending.  Improving symptoms without intervention on my reassessments.  He has reassuring blood work with mild hyperglycemia without acidosis, normal CBC.  Pending MRI at the time of signout to oncoming physician.  Clinical Course as of 06/22/24 1547  Thu Jun 22, 2024  1444 reassessed [DS]    Clinical Course User Index [DS] Claudene Rover, MD     FINAL CLINICAL IMPRESSION(S) / ED DIAGNOSES   Final diagnoses:  None     Rx / DC Orders   ED Discharge Orders     None        Note:  This document was prepared using Dragon voice recognition software and  may include unintentional dictation errors.   Claudene Rover, MD 06/22/24 [phone]

## 2024-06-22 NOTE — Hospital Course (Addendum)
 Patient is a 58 years old morbidly obese with history of VTE on Xarelto , hypertension presented to hospital with left-sided weakness and facial droop with difficulty speaking over the last few hours.  Last known normal was at 3 AM.  Patient also had right arm tremor.  Code stroke was called in and patient was brought into the hospital.  In the ED, blood pressure was elevated at 170/117.  Patient was noted to be tremulous.  CT head scan was negative for acute findings.  CT angiogram of the head and neck without large vessel occlusion.  Patient was not a candidate for IV thrombolytics due to outside the window.  Labs were notable for INR of 1.4.  MRI of the brain without contrast was performed without any acute abnormality.  Blood glucose was notable for hyperglycemia with 209.  Patient received morphine , IV fluids in the ED and was admitted hospital for further evaluation and treatment.  TIA.  CT head MRI of the neck and head and MRI of the brain was negative for acute stroke but had symptoms concerning for TIA.  Dr. Matthews from neurology saw the patient.  Patient still has difficulty swallowing and has failed bedside swallow evaluation.  Continue to monitor overnight.   Obesity Will benefit from ongoing  VTE on Xarelto .  Hypertension pressure elevated on presentation.  Resume home medication.  Closely monitor.

## 2024-06-22 NOTE — ED Notes (Signed)
 Called CCMD to place pt on central monitoring

## 2024-06-22 NOTE — Progress Notes (Signed)
 CODE STROKE- PHARMACY COMMUNICATION   Time CODE STROKE called/page received: 1250  Time response to CODE STROKE was made (in person or via phone): 1250  Time Stroke Kit retrieved from Pyxis (only if needed): N/A, patient outside of window for TNK  Name of Provider/Nurse contacted: Matthews  Past Medical History:  Diagnosis Date   Acute respiratory failure with hypoxia (HCC) 08/26/2022   Arthritis    History of kidney stones    Hypertension    Hypokalemia 08/29/2022   Prior to Admission medications   Medication Sig Start Date End Date Taking? Authorizing Provider  butalbital -acetaminophen -caffeine  (FIORICET ) 50-325-40 MG tablet Take 1-2 tablets by mouth every 6 (six) hours as needed for headache. 08/30/22   Fausto Sor A, DO  diphenhydrAMINE  (BENADRYL ) 25 mg capsule Take 50 mg by mouth at bedtime.    [provider]  empagliflozin  (JARDIANCE ) 10 MG TABS tablet Take by mouth. 06/18/21   [provider]  enalapril  (VASOTEC ) 5 MG tablet TAKE 1 TABLET (5 MG TOTAL) BY MOUTH DAILY. 08/20/21   Britta King, MD  enalapril  (VASOTEC ) 5 MG tablet Take by mouth. Patient not taking: Reported on 11/09/2022 04/23/20   [provider]  ENTRESTO 24-26 MG Take 0.5 tablets by mouth 2 (two) times daily.    [provider]  hydroxychloroquine  (PLAQUENIL ) 200 MG tablet Take 200 mg by mouth 2 (two) times daily.    [provider]  hydroxychloroquine  (PLAQUENIL ) 200 MG tablet Take by mouth. Patient not taking: Reported on 11/09/2022 12/22/19   [provider]  JARDIANCE  25 MG TABS tablet Take 25 mg by mouth every morning. 06/29/22   [provider]  OZEMPIC, 0.25 OR 0.5 MG/DOSE, 2 MG/3ML SOPN Inject 0.5 mg into the skin once a week. sunday Patient not taking: Reported on 11/09/2022 06/18/22   [provider]  OZEMPIC, 1 MG/DOSE, 4 MG/3ML SOPN Inject 1 mg into the skin once a week. 11/06/22   [provider]  rivaroxaban  (XARELTO ) 20 MG  TABS tablet Take 1 tablet (20 mg total) by mouth daily with supper. Starting 09/10/2022 09/10/22   Fausto Sor A, DO  rosuvastatin (CRESTOR) 10 MG tablet Take 10 mg by mouth at bedtime. Patient not taking: Reported on 11/09/2022    [provider]  spironolactone  (ALDACTONE ) 25 MG tablet Take 1 tablet (25 mg total) by mouth daily. Patient not taking: Reported on 11/09/2022 08/31/22   Fausto Sor A, DO  spironolactone  (ALDACTONE ) 50 MG tablet Take 1 tablet by mouth daily. 10/20/22   [provider]  tapentadol  (NUCYNTA ) 50 MG tablet     [provider]  torsemide  (DEMADEX ) 20 MG tablet Take 1 tablet (20 mg total) by mouth daily. If increasing weight or edema, take a second 20mg  dose in evening 08/30/22   Fausto Sor A, DO  torsemide  (DEMADEX ) 20 MG tablet Take by mouth. Patient not taking: Reported on 11/09/2022 05/31/21   [provider]  XARELTO  15 MG TABS tablet Take 1 tablet (15 mg total) by mouth 2 (two) times daily with a meal for 10 days. Through Jan. 24th.  On Jan 25th, start taking 20 mg once daily with a meal. 08/30/22 09/09/22  Fausto Sor LABOR, DO    Lum VEAR Mania ,PharmD Clinical Pharmacist  06/22/2024  1:59 PM

## 2024-06-22 NOTE — ED Provider Notes (Signed)
-----------------------------------------   6:09 PM on 06/22/2024 -----------------------------------------  I took over care of on this patient from Dr. Claudene.  MRI is negative, however the patient failed his swallow screen.  Since he has not returned completely to baseline, I feel that he would benefit from inpatient admission for further observation and potential additional stroke workup.  I notified Dr. Matthews from neurology.  I consulted Dr. Sonjia from the hospitalist service; based on our discussion he agrees to evaluate the patient for admission.   Jacolyn Pae, MD 06/22/24 [phone]

## 2024-06-22 NOTE — ED Triage Notes (Signed)
 Pt to ED via [facility] from home. Left sided weakness, facial droop, bilateral leg weakness, right arm tremor, expressive aphasia. Pt reports last known well 3am. AxO4 at baseline.

## 2024-06-22 NOTE — H&P (Signed)
 [facility] History and Physical  Hector Hancock FMW:[id number] DOB: March 01, 1966 DOA: 06/22/2024  Referring physician: ED  PCP: Pcp, No   Patient is coming from: Home  Chief Complaint: Facial and left-sided weakness  HPI:  Patient is a 58 years old morbidly obese with history of VTE on Xarelto , hypertension presented to hospital with left-sided weakness and facial droop with difficulty speaking over the last few hours.  Last known normal was at 3 AM.  Patient woke up around 11 AM in the morning and had left-sided weakness facial droop.  Noticed by the patient's spouse.  Patient also had right arm tremor.  Code stroke was called in and patient was brought into the hospital.  Patient denied any fever, shortness of breath, chest pain, palpitation.  Denied any nausea, vomiting, abdominal pain.  Denies any urinary urgency, frequency or dysuria.  Denied sick contacts or recent travel.  Patient does have history of VTE and is on Xarelto  which he has been taking it regularly.  In the ED, blood pressure was elevated at 170/117.  Patient was noted to be tremulous.  CT head scan was negative for acute findings.  CT angiogram of the head and neck without large vessel occlusion.  Patient was not a candidate for IV thrombolytics due to outside the window.  Labs were notable for INR of 1.4.  MRI of the brain without contrast was performed without any acute abnormality.  Blood glucose was notable for hyperglycemia with 209.  Patient received morphine , IV fluids in the ED and was admitted hospital for further evaluation and treatment since patient still had residual weakness on the left with failed swallow evaluation in the ED.   Assessment and Plan Principal Problem:   TIA (transient ischemic attack)  TIA.  CT head MRI of the neck and head and MRI of the brain was negative for acute stroke but had symptoms concerning for TIA.  Dr. Matthews from neurology saw the patient.  Patient still has difficulty  swallowing with mild left-sided residual weakness and has failed bedside swallow evaluation.  Continue to monitor overnight.  Will follow TIA protocol.  Check 2D echocardiogram, telemetry monitoring.  Obesity Body mass index is 47.76 kg/m.  Would benefit from ongoing weight loss as outpatient.  History of VTE on Xarelto . Will continue anticoagulation with Xarelto .  Essential hypertension pressure elevated on presentation.  Resume home medication.  Closely monitor.  DVT Prophylaxis: Xarelto   Review of Systems:  All systems were reviewed and were negative unless otherwise mentioned in the HPI   Past Medical History:  Diagnosis Date   Acute respiratory failure with hypoxia (HCC) 08/26/2022   Arthritis    History of kidney stones    Hypertension    Hypokalemia 08/29/2022   Past Surgical History:  Procedure Laterality Date   COLONOSCOPY WITH PROPOFOL  N/A 12/27/2015   Procedure: COLONOSCOPY WITH PROPOFOL ;  Surgeon: Rogelia Copping, MD;  Location: [facility];  Service: Endoscopy;  Laterality: N/A;   EXTRACORPOREAL SHOCK WAVE LITHOTRIPSY Left 07/28/2018   Procedure: EXTRACORPOREAL SHOCK WAVE LITHOTRIPSY (ESWL);  Surgeon: Kassie Ozell SAUNDERS, MD;  Location: [facility] OR[facility];  Service: Urology;  Laterality: Left;   EXTRACORPOREAL SHOCK WAVE LITHOTRIPSY Right 01/04/2020   Procedure: EXTRACORPOREAL SHOCK WAVE LITHOTRIPSY (ESWL);  Surgeon: Kassie Ozell SAUNDERS, MD;  Location: [facility] ORS;  Service: Urology;  Laterality: Right;   POLYPECTOMY  12/27/2015   Procedure: POLYPECTOMY;  Surgeon: Rogelia Copping, MD;  Location: [facility];  Service: Endoscopy;;   SACROILIAC JOINT FUSION Right  09/06/2013   Procedure: SACROILIAC JOINT FUSION;  Surgeon: Oneil Rodgers Priestly, MD;  Location: [facility];  Service: Orthopedics;  Laterality: Right;  Right sided sacroiliac joint fusion   TOTAL SHOULDER ARTHROPLASTY Left    WRIST SURGERY Left     Social History:  reports that he has never smoked. He has never used  smokeless tobacco. He reports that he does not drink alcohol and does not use drugs.  Allergies  Allergen Reactions   Prednisone Other (See Comments)    irritable   Codeine Itching    Family History  Problem Relation Age of Onset   Diabetes Mother    Arthritis Father    Diabetes Sister    Diabetes Sister      Prior to Admission medications   Medication Sig Start Date End Date Taking? Authorizing Provider  butalbital -acetaminophen -caffeine  (FIORICET ) 50-325-40 MG tablet Take 1-2 tablets by mouth every 6 (six) hours as needed for headache. 08/30/22   Fausto Sor A, DO  diphenhydrAMINE  (BENADRYL ) 25 mg capsule Take 50 mg by mouth at bedtime.    [provider]  empagliflozin  (JARDIANCE ) 10 MG TABS tablet Take by mouth. 06/18/21   [provider]  enalapril  (VASOTEC ) 5 MG tablet TAKE 1 TABLET (5 MG TOTAL) BY MOUTH DAILY. 08/20/21   Britta King, MD  enalapril  (VASOTEC ) 5 MG tablet Take by mouth. Patient not taking: Reported on 11/09/2022 04/23/20   [provider]  ENTRESTO 24-26 MG Take 0.5 tablets by mouth 2 (two) times daily.    [provider]  hydroxychloroquine  (PLAQUENIL ) 200 MG tablet Take 200 mg by mouth 2 (two) times daily.    [provider]  hydroxychloroquine  (PLAQUENIL ) 200 MG tablet Take by mouth. Patient not taking: Reported on 11/09/2022 12/22/19   [provider]  JARDIANCE  25 MG TABS tablet Take 25 mg by mouth every morning. 06/29/22   [provider]  OZEMPIC, 0.25 OR 0.5 MG/DOSE, 2 MG/3ML SOPN Inject 0.5 mg into the skin once a week. sunday Patient not taking: Reported on 11/09/2022 06/18/22   [provider]  OZEMPIC, 1 MG/DOSE, 4 MG/3ML SOPN Inject 1 mg into the skin once a week. 11/06/22   [provider]  rivaroxaban  (XARELTO ) 20 MG TABS tablet Take 1 tablet (20 mg total) by mouth daily with supper. Starting 09/10/2022 09/10/22   Fausto Sor A, DO  rosuvastatin (CRESTOR) 10 MG tablet Take  10 mg by mouth at bedtime. Patient not taking: Reported on 11/09/2022    [provider]  spironolactone  (ALDACTONE ) 25 MG tablet Take 1 tablet (25 mg total) by mouth daily. Patient not taking: Reported on 11/09/2022 08/31/22   Fausto Sor A, DO  spironolactone  (ALDACTONE ) 50 MG tablet Take 1 tablet by mouth daily. 10/20/22   [provider]  tapentadol  (NUCYNTA ) 50 MG tablet     [provider]  torsemide  (DEMADEX ) 20 MG tablet Take 1 tablet (20 mg total) by mouth daily. If increasing weight or edema, take a second 20mg  dose in evening 08/30/22   Fausto Sor A, DO  torsemide  (DEMADEX ) 20 MG tablet Take by mouth. Patient not taking: Reported on 11/09/2022 05/31/21   [provider]  XARELTO  15 MG TABS tablet Take 1 tablet (15 mg total) by mouth 2 (two) times daily with a meal for 10 days. Through Jan. 24th.  On Jan 25th, start taking 20 mg once daily with a meal. 08/30/22 09/09/22  Fausto Sor LABOR, DO    Physical Exam:  Vitals:  06/22/24 1430 06/22/24 1445 06/22/24 1500 06/22/24 1530  BP: 128/80 124/81 (!) 128/90 123/63  Pulse: 82 83 81 84  Resp: (!) 22 (!) 31 (!) 27 (!) 22  Temp:      SpO2: 96% 95% 96% 98%  Weight:      Height:       Wt Readings from Last 3 Encounters:  06/22/24 (!) 164.2 kg  11/16/22 (!) 158.7 kg  08/28/22 (!) 151.5 kg   Body mass index is 47.76 kg/m.  General: Obese  built, not in obvious distress HENT: Normocephalic, No scleral pallor or icterus noted. Oral mucosa is moist.  Chest:  Clear breath sounds.  No crackles or wheezes.  CVS: S1 &S2 heard. No murmur.  Regular rate and rhythm. Abdomen: Soft, nontender, nondistended.  Bowel sounds are heard. No abdominal mass palpated Extremities: No cyanosis, clubbing or edema.  Peripheral pulses are palpable. Psych: Alert, awake and oriented, normal mood CNS:  No cranial nerve deficits.  No facial droop noted.  Speech is distinct.  Left-sided weakness with decreased left hand  grip. Skin: Warm and dry.  No rashes noted.  Labs on Admission:   CBC: Recent Labs  Lab 06/22/24 1245  WBC 5.9  NEUTROABS 3.9  HGB 15.2  HCT 46.2  MCV 89.9  PLT 197    Basic Metabolic Panel: Recent Labs  Lab 06/22/24 1245  NA 136  K 3.8  CL 103  CO2 21*  GLUCOSE 209*  BUN 14  CREATININE 0.91  CALCIUM 8.7*    Liver Function Tests: Recent Labs  Lab 06/22/24 1245  AST 41  ALT 39  ALKPHOS 62  BILITOT 0.9  PROT 7.5  ALBUMIN  3.8   No results for input(s): LIPASE, AMYLASE in the last 168 hours. No results for input(s): AMMONIA in the last 168 hours.  Cardiac Enzymes: No results for input(s): CKTOTAL, CKMB, CKMBINDEX, TROPONINI in the last 168 hours.  BNP (last 3 results) No results for input(s): BNP in the last 8760 hours.  ProBNP (last 3 results) No results for input(s): PROBNP in the last 8760 hours.  CBG: Recent Labs  Lab 06/22/24 1242  GLUCAP 202*    Radiological Exams on Admission: MR BRAIN WO CONTRAST Result Date: 06/22/2024 EXAM: MRI BRAIN WITHOUT CONTRAST 06/22/2024 04:06:46 PM TECHNIQUE: Multiplanar multisequence MRI of the head/brain was performed without the administration of intravenous contrast. COMPARISON: Same day CT head. MRI head 06/19/2014 CLINICAL HISTORY: Right sided weakness, aphasia. FINDINGS: BRAIN AND VENTRICLES: No acute infarct. No intracranial hemorrhage. No mass. No midline shift. No hydrocephalus. Scattered mild to moderate T2 hyperintensities in the white matter and pons are compatible with chronic microvascular ischemic change. Normal flow voids. ORBITS: No acute abnormality. SINUSES AND MASTOIDS: No acute abnormality. BONES AND SOFT TISSUES: Normal marrow signal. No acute soft tissue abnormality. IMPRESSION: 1. No acute intracranial abnormality. 2. Mild to moderate chronic microvascular ischemic change. Electronically signed by: Gilmore Molt MD 06/22/2024 04:29 PM EST RP Workstation: [id number]   CT ANGIO  HEAD NECK W WO CM W PERF (CODE STROKE) LKW > 6h Result Date: 06/22/2024 EXAM: CTA Head and Neck with Perfusion 06/22/2024 01:06:33 PM TECHNIQUE: CTA of the head and neck was performed with and without the administration of 100 mL of intravenous iohexol  (OMNIPAQUE ) 350 MG/ML injection. 3D postprocessing with multiplanar reconstructions and MIPs was performed to evaluate the vascular anatomy. Cerebral perfusion analysis using computed tomography with contrast administration, including post-processing of parametric maps with determination of cerebral blood flow, cerebral blood volume,  mean transit time and time-to-maximum. Automated exposure control, iterative reconstruction, and/or weight based adjustment of the mA/kV was utilized to reduce the radiation dose to as low as reasonably achievable. Relatively faint contrast bolus is present resulting in decreased conspicuity of distal MCA branch vessels. COMPARISON: None available CLINICAL HISTORY: Neuro deficit, acute, stroke suspected. Left sided weakness and facial droop. Bilateral leg weakness. Expressive aphasia. FINDINGS: CTA NECK: AORTIC ARCH AND ARCH VESSELS: Atherosclerotic calcifications are present in the distal aortic arch without aneurysm or stenosis. No dissection or arterial injury. No significant stenosis of the brachiocephalic or subclavian arteries. CERVICAL CAROTID ARTERIES: Atherosclerotic calcifications are present at the right carotid bifurcation, right greater than left carotid bifurcation, without significant stenosis relative to the more distal vessel. No dissection or arterial injury. CERVICAL VERTEBRAL ARTERIES: The vertebral arteries are codominant. No dissection, arterial injury, or significant stenosis. LUNGS AND MEDIASTINUM: Unremarkable. SOFT TISSUES: No acute abnormality. BONES: No acute abnormality. CTA HEAD: ANTERIOR CIRCULATION: Relatively faint contrast bolus is present resulting in decreased conspicuity of distal MCA branch vessels.  No significant stenosis of the internal carotid arteries. No significant stenosis of the anterior cerebral arteries. No significant stenosis of the middle cerebral arteries. No aneurysm. POSTERIOR CIRCULATION: No significant stenosis of the posterior cerebral arteries. No significant stenosis of the basilar artery. No significant stenosis of the vertebral arteries. No aneurysm. OTHER: No dural venous sinus thrombosis on this non-dedicated study. CT PERFUSION: EXAM QUALITY: Exam quality is adequate with diagnostic perfusion maps. No significant motion artifact. Appropriate arterial inflow and venous outflow curves. CORE INFARCT (CBF<30% volume): 0 mL TOTAL HYPOPERFUSION (Tmax>6s volume): 0 mL PENUMBRA: Mismatch volume: 0 mL Mismatch ratio: not applicable Location: not applicable IMPRESSION: 1. No acute large vessel occlusion. 2. No hemodynamically significant stenosis or aneurysm in the head or neck vessels. 3. No evidence of ischemia on CT brain perfusion. Electronically signed by: Lonni Necessary MD 06/22/2024 01:31 PM EST RP Workstation: [id number]   CT HEAD CODE STROKE WO CONTRAST Result Date: 06/22/2024 EXAM: CT HEAD WITHOUT CONTRAST 06/22/2024 01:00:01 PM TECHNIQUE: CT of the head was performed without the administration of intravenous contrast. Automated exposure control, iterative reconstruction, and/or weight based adjustment of the mA/kV was utilized to reduce the radiation dose to as low as reasonably achievable. COMPARISON: Brain MRI 06/19/2014, Head CT 06/18/2014. CLINICAL HISTORY: 58 year old male with acute neuro deficit, stroke suspected. FINDINGS: BRAIN AND VENTRICLES: No acute hemorrhage. No evidence of acute infarct. Cerebral volume remains normal for age. Indistinct new hypodensity at the anterior limb of the left internal capsule on series 3 image 20 compatible with age indeterminate small vessel disease. Although the abnormality is fairly circumscribed on coronal images (series 5 image  27) suggesting a chronic time course. Elsewhere gray white differentiation is within normal limits. No hydrocephalus. No extra-axial collection. No mass effect or midline shift. No suspicious intracranial vascular hyperdensity. ORBITS: No acute abnormality. No gaze deviation. SINUSES: Paranasal sinuses, mastoids, and middle ear cavities remain well aerated. SOFT TISSUES AND SKULL: No acute soft tissue abnormality. No skull fracture. [place] stroke program early CT score (aspects) ----- Ganglionic (caudate, ic, lentiform nucleus, insula, M1-m3): 7 Supraganglionic (m4-m6): 3 Total: 10 IMPRESSION: 1. No acute intracranial abnormality. ASPECTS 10. 2. left internal capsule small vessel disease, likely chronic. 3. These results were communicated to Dr. Matthews at 1305 hours on 06/22/2024 by text page via the [facility] messaging system. Electronically signed by: Helayne Hurst MD 06/22/2024 01:06 PM EST RP Workstation: [id number]    EKG: Not not available  for review.  Will order for one   Consultant: Neurology   Code Status: Full code  Microbiology none  Antibiotics: None  Family Communication:  Patients' condition and plan of care including tests being ordered have been discussed with the patient and family at bedside who indicate understanding and agree with the plan.   Status is: Observation   Severity of Illness: The appropriate patient status for this patient is OBSERVATION. Observation status is judged to be reasonable and necessary in order to provide the required intensity of service to ensure the patient's safety. The patient's presenting symptoms, physical exam findings, and initial radiographic and laboratory data in the context of their medical condition is felt to place them at decreased risk for further clinical deterioration. Furthermore, it is anticipated that the patient will be medically stable for discharge from the hospital within 2 midnights of admission.   Signed, Vernal Alstrom,  MD [facility] 06/22/2024

## 2024-06-22 NOTE — ED Notes (Signed)
 Pt transported to MRI

## 2024-06-22 NOTE — ED Notes (Signed)
CODE  STROKE  CALLED  TO  Melburn Hake

## 2024-06-22 NOTE — ED Notes (Signed)
 Pt requested pain medication for headache. MD notified.

## 2024-06-23 ENCOUNTER — Observation Stay
Admit: 2024-06-23 | Discharge: 2024-06-23 | Disposition: A | Discharge status: Home / Self Care | Attending: Internal Medicine | Admitting: Internal Medicine

## 2024-06-23 DIAGNOSIS — R531 Weakness: Secondary | ICD-10-CM

## 2024-06-23 LAB — CBC
HCT: 46 % (ref 39.0–52.0)
Hemoglobin: 15.2 g/dL (ref 13.0–17.0)
MCH: 30.2 pg (ref 26.0–34.0)
MCHC: 33 g/dL (ref 30.0–36.0)
MCV: 91.3 fL (ref 80.0–100.0)
Platelets: 185 K/uL (ref 150–400)
RBC: 5.04 MIL/uL (ref 4.22–5.81)
RDW: 13.5 % (ref 11.5–15.5)
WBC: 7 K/uL (ref 4.0–10.5)
nRBC: 0 % (ref 0.0–0.2)

## 2024-06-23 LAB — ECHOCARDIOGRAM COMPLETE
AR max vel: 3.82 cm2
AV Area VTI: 3.66 cm2
AV Area mean vel: 3.71 cm2
AV Mean grad: 1 mmHg
AV Peak grad: 2.5 mmHg
Ao pk vel: 0.79 m/s
Area-P 1/2: 4.21 cm2
Calc EF: 47.1 %
Height: 73 in
MV VTI: 2.82 cm2
S' Lateral: 2.96 cm
Single Plane A2C EF: 50.6 %
Single Plane A4C EF: 45.3 %
Weight: 5784.87 [oz_av]

## 2024-06-23 LAB — BASIC METABOLIC PANEL WITH GFR
Anion gap: 8 (ref 5–15)
BUN: 13 mg/dL (ref 6–20)
CO2: 26 mmol/L (ref 22–32)
Calcium: 8.6 mg/dL — ABNORMAL LOW (ref 8.9–10.3)
Chloride: 104 mmol/L (ref 98–111)
Creatinine, Ser: 0.92 mg/dL (ref 0.61–1.24)
GFR, Estimated: >60 mL/min (ref >60)
Glucose, Bld: 133 mg/dL — ABNORMAL HIGH (ref 70–99)
Potassium: 3.9 mmol/L (ref 3.5–5.1)
Sodium: 138 mmol/L (ref 135–145)

## 2024-06-23 LAB — LIPID PANEL
Cholesterol: 117 mg/dL (ref 0–200)
HDL: 41 mg/dL (ref >40)
LDL Cholesterol: 49 mg/dL (ref 0–99)
Total CHOL/HDL Ratio: 2.9 ratio
Triglycerides: 134 mg/dL (ref <150)
VLDL: 27 mg/dL (ref 0–40)

## 2024-06-23 LAB — MAGNESIUM: Magnesium: 2.2 mg/dL (ref 1.7–2.4)

## 2024-06-23 LAB — HIV ANTIBODY (ROUTINE TESTING W REFLEX): HIV Screen 4th Generation wRfx: NONREACTIVE

## 2024-06-23 MED ORDER — PERFLUTREN LIPID MICROSPHERE
1.0000 mL | INTRAVENOUS | Status: AC | PRN
  Administered 2024-06-23: 2 mL via INTRAVENOUS

## 2024-06-23 NOTE — Evaluation (Signed)
 Clinical/Bedside Swallow Evaluation Patient Details  Name: KOLSON CHOVANEC MRN: [id number] Date of Birth: 05-Mar-1966  Today's Date: 06/23/2024 Time: SLP Start Time (ACUTE ONLY): 0815 SLP Stop Time (ACUTE ONLY): 0828 SLP Time Calculation (min) (ACUTE ONLY): 13 min  Past Medical History:  Past Medical History:  Diagnosis Date   Acute respiratory failure with hypoxia (HCC) 08/26/2022   Arthritis    History of kidney stones    Hypertension    Hypokalemia 08/29/2022   Past Surgical History:  Past Surgical History:  Procedure Laterality Date   COLONOSCOPY WITH PROPOFOL  N/A 12/27/2015   Procedure: COLONOSCOPY WITH PROPOFOL ;  Surgeon: Rogelia Copping, MD;  Location: [facility];  Service: Endoscopy;  Laterality: N/A;   EXTRACORPOREAL SHOCK WAVE LITHOTRIPSY Left 07/28/2018   Procedure: EXTRACORPOREAL SHOCK WAVE LITHOTRIPSY (ESWL);  Surgeon: Kassie Ozell SAUNDERS, MD;  Location: [facility] OR[facility];  Service: Urology;  Laterality: Left;   EXTRACORPOREAL SHOCK WAVE LITHOTRIPSY Right 01/04/2020   Procedure: EXTRACORPOREAL SHOCK WAVE LITHOTRIPSY (ESWL);  Surgeon: Kassie Ozell SAUNDERS, MD;  Location: [facility];  Service: Urology;  Laterality: Right;   POLYPECTOMY  12/27/2015   Procedure: POLYPECTOMY;  Surgeon: Rogelia Copping, MD;  Location: [facility];  Service: Endoscopy;;   SACROILIAC JOINT FUSION Right 09/06/2013   Procedure: SACROILIAC JOINT FUSION;  Surgeon: Oneil Rodgers Priestly, MD;  Location: [facility] OR;  Service: Orthopedics;  Laterality: Right;  Right sided sacroiliac joint fusion   TOTAL SHOULDER ARTHROPLASTY Left    WRIST SURGERY Left    HPI:  Patient is a 58 years old morbidly obese with history of VTE on Xarelto , hypertension presented to hospital with left-sided weakness and facial droop with difficulty speaking over the last few hours.  Last known normal was at 3 AM.  Patient woke up around 11 AM in the morning and had left-sided weakness facial droop.  Noticed by the patient's spouse.  Patient also  had right arm tremor.  Code stroke was called in and patient was brought into the hospital on 06/22/2024. In the ED, blood pressure was elevated at 170/117.  Patient was noted to be tremulous.  CT head scan was negative for acute findings.  CT angiogram of the head and neck without large vessel occlusion.  Patient was not a candidate for IV thrombolytics due to outside the window.  Labs were notable for INR of 1.4.  MRI of the brain without contrast was performed without any acute abnormality.  Blood glucose was notable for hyperglycemia with 209.  Patient received morphine , IV fluids in the ED and was admitted hospital for further evaluation and treatment since patient still had residual weakness on the left with failed swallow evaluation in the ED.    Assessment / Plan / Recommendation  Clinical Impression  Pt presents with adequate oropharyngeal abilities when consuming puree, graham crackers and thin liquids via straw. Pt is a good historian, no history of dysphagia and is aware that he had difficulty consuming consecutive straw sips during the stroke swallow screen. Despite this, he doesn't endorse any overt s/s of dysphagia/aspiration. As such, recommend regular diet with thin liquids, medicine whole with thin liquids. all education has been completed. ST services to sign off. SLP Visit Diagnosis: Dysphagia, unspecified (R13.10)    Aspiration Risk  No limitations    Diet Recommendation Regular;Thin liquid    Liquid Administration via: Cup;Straw Medication Administration: Whole meds with liquid Supervision: Patient able to self feed Compensations: Minimize environmental distractions;Slow rate;Small sips/bites Postural Changes: Seated upright at 90 degrees;Remain upright  for at least 30 minutes after po intake    Other  Recommendations Oral Care Recommendations: Oral care BID        Functional Status Assessment Patient has not had a recent decline in their functional status    Swallow  Study   General Date of Onset: 06/22/24 HPI: Patient is a 58 years old morbidly obese with history of VTE on Xarelto , hypertension presented to hospital with left-sided weakness and facial droop with difficulty speaking over the last few hours.  Last known normal was at 3 AM.  Patient woke up around 11 AM in the morning and had left-sided weakness facial droop.  Noticed by the patient's spouse.  Patient also had right arm tremor.  Code stroke was called in and patient was brought into the hospital on 06/22/2024. In the ED, blood pressure was elevated at 170/117.  Patient was noted to be tremulous.  CT head scan was negative for acute findings.  CT angiogram of the head and neck without large vessel occlusion.  Patient was not a candidate for IV thrombolytics due to outside the window.  Labs were notable for INR of 1.4.  MRI of the brain without contrast was performed without any acute abnormality.  Blood glucose was notable for hyperglycemia with 209.  Patient received morphine , IV fluids in the ED and was admitted hospital for further evaluation and treatment since patient still had residual weakness on the left with failed swallow evaluation in the ED. Type of Study: Bedside Swallow Evaluation Previous Swallow Assessment: none in chart Diet Prior to this Study: NPO Temperature Spikes Noted: No Respiratory Status: Room air History of Recent Intubation: No Behavior/Cognition: Alert;Cooperative;Pleasant mood Oral Cavity Assessment: Within Functional Limits Oral Care Completed by SLP: No Oral Cavity - Dentition: Adequate natural dentition Vision: Functional for self-feeding Self-Feeding Abilities: Able to feed self Patient Positioning: Upright in bed Baseline Vocal Quality: Normal Volitional Cough: Strong Volitional Swallow: Able to elicit    Oral/Motor/Sensory Function Overall Oral Motor/Sensory Function: Within functional limits   Ice Chips Ice chips: Not tested   Thin Liquid Thin Liquid:  Within functional limits Presentation: Self Fed;Straw    Nectar Thick Nectar Thick Liquid: Not tested   Honey Thick Honey Thick Liquid: Not tested   Puree Puree: Within functional limits Presentation: Self Fed;Spoon   Solid     Solid: Within functional limits Presentation: Self Fed     John Vasconcelos B. Rubbie, M.S., CCC-SLP, Tree Surgeon Certified Brain Injury Specialist [facility]  [facility] Rehabilitation Services Office [phone] Ascom [phone] Fax [phone]

## 2024-06-23 NOTE — TOC CM/SW Note (Signed)
 Transition of Care [facility]) - Inpatient Brief Assessment   Patient Details  Name: Hector Hancock MRN: [id number] Date of Birth: 07/08/1966  Transition of Care [facility]) CM/SW Contact:    Daved JONETTA Hamilton, RN Phone Number: 06/23/2024, 10:57 AM   Clinical Narrative:   Transition of Care (TOC) Screening Note   Patient Details  Name: Hector Hancock Date of Birth: 1966-08-09   Transition of Care [facility]) CM/SW Contact:    Daved JONETTA Hamilton, RN Phone Number: 06/23/2024, 10:57 AM  PCP resources added to AVS  Transition of Care Department [facility]) has reviewed patient and no TOC needs have been identified at this time.  If new patient transition needs arise, please place a TOC consult.    Transition of Care Asessment: Insurance and Status: Insurance coverage has been reviewed Patient has primary care physician: No (PCP resources added to AVS)   Prior level of function:: Independent Prior/Current Home Services: No current home services Social Drivers of Health Review: SDOH reviewed interventions complete Readmission risk has been reviewed: No (patient is in observation status, no score generated) Transition of care needs: no transition of care needs at this time

## 2024-06-23 NOTE — Evaluation (Signed)
 Occupational Therapy Evaluation Patient Details Name: Hector Hancock MRN: [id number] DOB: 1966/03/02 Today's Date: 06/23/2024   History of Present Illness   presented to ER secondary to L facial droop, L UE/LE weakness; admitted for TIA/CVA work up.  MRI negative for acute insult; symptoms resolving.     Clinical Impressions Chart reviewed to date, pt greeted semi supine in bed, agreeable to OT evaluation. PTA pt is MODI-I in ADL/IADL, amb household, short community distances with no AD; he does have a scooter for longer community distances due to ongoing decreased endurance. Pt is performing ADL/IADL close to baseline. He does have mild LUE weakness grossly 4-/5 throughout including grip strength. He is able to perform ADLs with BUE however. Improved amb with use of RW, performing toilet transfer with supervision. OT will follow acutely to facilitate optimal ADL/functional mobility performance.      If plan is discharge home, recommend the following:   A little help with walking and/or transfers;Assistance with cooking/housework;Help with stairs or ramp for entrance;Assist for transportation     Functional Status Assessment   Patient has had a recent decline in their functional status and demonstrates the ability to make significant improvements in function in a reasonable and predictable amount of time.     Equipment Recommendations   None recommended by OT;Other (comment) (pt has recommended equipment)     Recommendations for Other Services         Precautions/Restrictions   Precautions Precautions: Fall Recall of Precautions/Restrictions: Intact Restrictions Weight Bearing Restrictions Per Provider Order: No     Mobility Bed Mobility Overal bed mobility: Needs Assistance Bed Mobility: Supine to Sit     Supine to sit: Modified independent (Device/Increase time)          Transfers Overall transfer level: Needs assistance Equipment used: Rolling walker (2  wheels) Transfers: Sit to/from Stand Sit to Stand: Supervision                  Balance Overall balance assessment: Needs assistance Sitting-balance support: No upper extremity supported, Feet supported Sitting balance-Leahy Scale: Good     Standing balance support: Bilateral upper extremity supported, During functional activity, Reliant on assistive device for balance Standing balance-Leahy Scale: Good                             ADL either performed or assessed with clinical judgement   ADL Overall ADL's : Needs assistance/impaired Eating/Feeding: Modified independent   Grooming: Supervision/safety;Standing Grooming Details (indicate cue type and reason): anticipate                 Toilet Transfer: Supervision/safety;Regular Toilet;Grab bars (bari RW)   Toileting- Clothing Manipulation and Hygiene: Supervision/safety (bari RW)       Functional mobility during ADLs: Supervision/safety;Rolling walker (2 wheels) (approx 15' in room two attempts with bari RW)       Vision Patient Visual Report: No change from baseline Vision Assessment?: Yes Eye Alignment: Within Functional Limits Ocular Range of Motion: Within Functional Limits Alignment/Gaze Preference: Within Defined Limits Tracking/Visual Pursuits: Able to track stimulus in all quads without difficulty Convergence: Within functional limits Visual Fields: No apparent deficits     Perception         Praxis         Pertinent Vitals/Pain Pain Assessment Pain Assessment: Faces Faces Pain Scale: Hurts little more Pain Location: L LE Pain Descriptors / Indicators: Aching Pain Intervention(s): Repositioned, Monitored during  session, Limited activity within patient's tolerance     Extremity/Trunk Assessment Upper Extremity Assessment Upper Extremity Assessment: LUE deficits/detail;Right hand dominant RUE Deficits / Details: grossly WFL LUE Deficits / Details: grossly 4-/5 throughout;  mildly weak grip strength deficits; He reports ?decreased sensation throughout LUE   Lower Extremity Assessment Lower Extremity Assessment: Defer to PT evaluation RLE Deficits / Details: grossly 5/5 throughout; no sensory deficit LLE Deficits / Details: grossly 4-/5 throughout (?give-way weakness?); no sensory deficit. Coordination and RAMPs grossly WFL       Communication Communication Communication: No apparent difficulties   Cognition Arousal: Alert Behavior During Therapy: WFL for tasks assessed/performed Cognition: No apparent impairments                               Following commands: Intact       Cueing  General Comments   Cueing Techniques: Verbal cues  vss throughout   Exercises Other Exercises Other Exercises: edu re role of OT, role of rehab, discharge recommendations   Shoulder Instructions      Home Living Family/patient expects to be discharged to:: Private residence Living Arrangements: Spouse/significant other;Other relatives Available Help at Discharge: Family;Available 24 hours/day Type of Home: House Home Access: Stairs to enter [employer] of Steps: 2 back steps, front is level entry Entrance Stairs-Rails: Can reach both Home Layout: One level     Bathroom Shower/Tub: Producer, Television/film/video: Handicapped height Bathroom Accessibility: Yes   Home Equipment: Shower seat - built in;Hand held shower head;Grab bars - tub/shower;Chiropractor (4 wheels);Rolling Walker (2 wheels)   Additional Comments: bari RW; was getting outpatient lymphedema OT in March 2025, lost insurance but got it back again and is going to start back up per his report      Prior Functioning/Environment Prior Level of Function : Driving;Independent/Modified Independent             Mobility Comments: amb with no AD; he reports he amb approx 400' before getting tired, has a scooter for community mobility ADLs Comments: MOD  I-I in ADL/IADL    OT Problem List: Decreased activity tolerance;Impaired UE functional use   OT Treatment/Interventions: Self-care/ADL training;Therapeutic activities;Therapeutic exercise;DME and/or AE instruction;Energy conservation      OT Goals(Current goals can be found in the care plan section)   Acute Rehab OT Goals Patient Stated Goal: go home OT Goal Formulation: With patient Time For Goal Achievement: 07/07/24 Potential to Achieve Goals: Good ADL Goals Pt Will Perform Grooming: with modified independence;sitting;standing Pt Will Perform Lower Body Dressing: with modified independence;sitting/lateral leans;sit to/from stand Pt Will Transfer to Toilet: with modified independence;ambulating Pt Will Perform Toileting - Clothing Manipulation and hygiene: with modified independence;sitting/lateral leans;sit to/from stand   OT Frequency:  Min 2X/week    Co-evaluation              AM-PAC OT 6 Clicks Daily Activity     Outcome Measure Help from another person eating meals?: None Help from another person taking care of personal grooming?: None Help from another person toileting, which includes using toliet, bedpan, or urinal?: None Help from another person bathing (including washing, rinsing, drying)?: A Little Help from another person to put on and taking off regular upper body clothing?: None Help from another person to put on and taking off regular lower body clothing?: A Little 6 Click Score: 22   End of Session Equipment Utilized During Treatment: Rolling walker (2  wheels) Nurse Communication: Mobility status  Activity Tolerance: Patient tolerated treatment well Patient left: in chair;with call bell/phone within reach;with chair alarm set  OT Visit Diagnosis: Other abnormalities of gait and mobility (R26.89)                Time: 9094-9075 OT Time Calculation (min): 19 min Charges:  OT General Charges $OT Visit: 1 Visit OT Evaluation $OT Eval Low  Complexity: 1 Low  Therisa Sheffield, OTD OTR/L  06/23/24, 11:05 AM

## 2024-06-23 NOTE — Progress Notes (Signed)
 Rounding completed on patient. Pt OOB to chair. Denies any unmet needs at present time. Hopeful for discharge home today.

## 2024-06-23 NOTE — Progress Notes (Signed)
 SLP Note  Patient Details Name: Hector Hancock MRN: [id number] DOB: 1966/04/26  Pt's cognitive linguistic abilities are within functional limits with no acute deficits identified.   Leonore Frankson B. Rubbie, M.S., CCC-SLP, Tree Surgeon Certified Brain Injury Specialist [employer]  [facility] Rehabilitation Services Office [phone] Ascom [phone] Fax [phone]

## 2024-06-23 NOTE — Plan of Care (Signed)

## 2024-06-23 NOTE — Discharge Instructions (Signed)
.  Some PCP options in [place] area- not a comprehensive list  [facility]- [phone] [facility]- [phone] [facility]- [phone] [facility]- [phone] [facility]- [phone] Nichole [phone]  or [facility] Physician Referral Line [phone]   [facility] Primary Care Provider List  [facility] at [facility] [address] [place], [place] [address] [phone]  [facility] at [facility] [address] [place], [place] [address] [phone]  [facility] Patient [facility] [address] [place], [place], [place] [address] [phone]  [facility] Primary Care at [facility] [address], [place], [place] [address] [phone]  [facility] Primary Care at [facility] [address], [place], [place] [address] [phone]  [facility] [address] [place], [place], [place] [address] [phone]  [facility] [address], [place], [place] [address] [phone]  [facility] [address] [place], [place] [address] [phone]  [facility] [address], [place], [place] [address] [phone]  [facility] [address], [place], [place] [address] [phone]  [facility] [address] [place], [address], [place], [place] [address] [phone]  [facility] and Adult [facility] [address], [place], [place] [address] [phone]  [facility] at [facility] [address] [place], [place], [place] [address] [phone]  [facility] at [facility] [address] [place], [place] [address] [phone]  [facility] at [facility] [address] [place], [place] [address] [phone]

## 2024-06-23 NOTE — Evaluation (Signed)
 Physical Therapy Evaluation Patient Details Name: Hector Hancock MRN: [id number] DOB: 13-Jan-1966 Today's Date: 06/23/2024  History of Present Illness  presented to ER secondary to L facial droop, L UE/LE weakness; admitted for TIA/CVA work up.  MRI negative for acute insult; symptoms resolving.  Clinical Impression  Patient seated in recliner upon arrival to room; alert and oriented, follows commands and agreeable to participation with treatment session.  Feels symptoms are resolving (mild residual weakness remains in L-side) and speech is generally back to baseline. Does endorse chronic pain to L LE (FACES 4/10), unchanged with this admission; improved with rest and repositioning.  Mild weakness noted to L UE/LE, grossly at least 4-/5 (? Give-way weakness?), but largely functional for basic transfers and mobility tasks.  Denies sensory deficits; coordination and RAMPs grossly WFL. Able to complete sit/stand, standing balance and short-distance gait (50') without assist device, cga/close sup.  Demonstrates broad BOS, limited trunk rotation and arm swing; generally guarded with limited balance reactions.  Slightly antalgic with decreased WBing/stance time L LE.  No buckling or LOB Completes additional 150' with RW, sup--improved gait fluidity, symmetry, stability and overall safety.  Do recommend continued use of RW for gait efforts at this time.  Patient voiced understanding Would benefit from skilled PT to address above deficits and promote optimal return to PLOF.; will maintain on PT caseload throughout remaining hospital stay. Anticipate no formal PT needs upon discharge       If plan is discharge home, recommend the following: A little help with walking and/or transfers;A little help with bathing/dressing/bathroom   Can travel by private vehicle        Equipment Recommendations  (has equipment necessary)  Recommendations for Other Services       Functional Status Assessment Patient has  had a recent decline in their functional status and demonstrates the ability to make significant improvements in function in a reasonable and predictable amount of time.     Precautions / Restrictions Precautions Precautions: Fall Restrictions Weight Bearing Restrictions Per Provider Order: No      Mobility  Bed Mobility               General bed mobility comments: seated in recliner beginning/end of treatment session    Transfers Overall transfer level: Needs assistance Equipment used: Rolling walker (2 wheels) Transfers: Sit to/from Stand Sit to Stand: Supervision, Modified independent (Device/Increase time)                Ambulation/Gait Ambulation/Gait assistance: Supervision Gait Distance (Feet): 50 Feet Assistive device: None         General Gait Details: broad BOS, limited trunk rotation and arm swing; generally guarded with limited balance reactions.  Slightly antalgic with decreased WBing/stance time L LE.  No buckling or LOB  Stairs            Wheelchair Mobility     Tilt Bed    Modified Rankin (Stroke Patients Only)       Balance Overall balance assessment: Needs assistance Sitting-balance support: No upper extremity supported, Feet supported Sitting balance-Leahy Scale: Good     Standing balance support: No upper extremity supported Standing balance-Leahy Scale: Fair                               Pertinent Vitals/Pain Pain Assessment Pain Assessment: Faces Faces Pain Scale: Hurts little more Pain Location: L LE Pain Descriptors / Indicators: Aching Pain Intervention(s): Limited  activity within patient's tolerance, Monitored during session, Repositioned    Home Living Family/patient expects to be discharged to:: Private residence Living Arrangements: Spouse/significant other;Other relatives Available Help at Discharge: Family;Available 24 hours/day Type of Home: House Home Access: Stairs to enter Entrance  Stairs-Rails: Can reach both Entrance Stairs-Number of Steps: 2 back steps, front is level entry   Home Layout: One level Home Equipment: Shower seat - built in;Hand held shower head;Grab bars - tub/shower;Chiropractor (4 wheels);Rolling Walker (2 wheels) Additional Comments: bari RW; was getting outpatient lymphedema OT in March 2025, lost insurance but got it back again and is going to start back up per his report    Prior Function Prior Level of Function : Driving;Independent/Modified Independent             Mobility Comments: amb with no AD; he reports he amb approx 400' before getting tired, has a scooter for community mobility ADLs Comments: MOD I-I in ADL/IADL     Extremity/Trunk Assessment   Upper Extremity Assessment Upper Extremity Assessment: LUE deficits/detail;RUE deficits/detail RUE Deficits / Details: grossly 5/5 throughout; no sensory deficit LUE Deficits / Details: grossly 4-/5 throughout (?give-way weakness?); no sensory deficit.  Coordination and RAMPs grossly WFL    Lower Extremity Assessment Lower Extremity Assessment: RLE deficits/detail;LLE deficits/detail RLE Deficits / Details: grossly 5/5 throughout; no sensory deficit LLE Deficits / Details: grossly 4-/5 throughout (?give-way weakness?); no sensory deficit. Coordination and RAMPs grossly WFL       Communication   Communication Communication: No apparent difficulties    Cognition Arousal: Alert Behavior During Therapy: WFL for tasks assessed/performed   PT - Cognitive impairments: No apparent impairments                         Following commands: Intact       Cueing Cueing Techniques: Verbal cues     General Comments      Exercises Other Exercises Other Exercises: 150' with RW, sup--improved gait fluidity, symmetry, stability and overall safety.  Do recommend continued use of RW for gait efforts at this time.  Patient voiced understanding   Assessment/Plan     PT Assessment Patient needs continued PT services  PT Problem List Decreased strength;Decreased activity tolerance;Decreased balance;Decreased mobility;Decreased knowledge of use of DME;Decreased safety awareness;Decreased knowledge of precautions       PT Treatment Interventions DME instruction;Gait training;Stair training;Therapeutic activities;Therapeutic exercise;Balance training;Cognitive remediation;Patient/family education;Functional mobility training;Neuromuscular re-education    PT Goals (Current goals can be found in the Care Plan section)  Acute Rehab PT Goals Patient Stated Goal: to return home PT Goal Formulation: With patient Time For Goal Achievement: 07/07/24 Potential to Achieve Goals: Good    Frequency Min 2X/week     Co-evaluation               AM-PAC PT 6 Clicks Mobility  Outcome Measure Help needed turning from your back to your side while in a flat bed without using bedrails?: None Help needed moving from lying on your back to sitting on the side of a flat bed without using bedrails?: None Help needed moving to and from a bed to a chair (including a wheelchair)?: None Help needed standing up from a chair using your arms (e.g., wheelchair or bedside chair)?: None Help needed to walk in hospital room?: None Help needed climbing 3-5 steps with a railing? : A Little 6 Click Score: 23    End of Session Equipment Utilized During Treatment: Gait belt  Activity Tolerance: Patient tolerated treatment well Patient left: in chair;with call bell/phone within reach;with chair alarm set Nurse Communication: Mobility status PT Visit Diagnosis: Difficulty in walking, not elsewhere classified (R26.2);Other symptoms and signs involving the nervous system (R29.898)    Time: 9052-8996 PT Time Calculation (min) (ACUTE ONLY): 16 min   Charges:   PT Evaluation $PT Eval Low Complexity: 1 Low   PT General Charges $$ ACUTE PT VISIT: 1 Visit         Dvonte Gatliff H.  Delores, PT, DPT, NCS 06/23/24, 10:15 AM [phone]

## 2024-06-23 NOTE — Consult Note (Addendum)
 NEUROLOGY CONSULT NOTE   Date of service: June 22, 2024 Patient Name: Hector Hancock MRN:  [id number] DOB:  06-Jan-1966 Chief Complaint: stroke code Requesting Provider: Ester Sharps  History of Present Illness  Hector Hancock is a 57 y.o. male with hx of HTN, VTE on xarelto  who presents with L sided facial droop and difficulty speaking. LKW 0300 prior to going to bed. NIHSS = 7 for disorientation, L facial droop, mild symmetric weakness in all extremities, and dysarthria. CT head showed no acute process. CTA/CTP showed no LVO or perfusion deficit. TNK was not administered 2/2 patient on anticoagulation, outside the window, and functional features on exam.   Neurologist paged: [phone] Neurologist arrival: 1252  LKW: 0300 Modified rankin score: 2-Slight disability-UNABLE to perform all activities but does not need assistance IV Thrombolysis: no, see above  NIHSS components Score: Comment  1a Level of Conscious 0[]  1[]  2[]  3[]      1b LOC Questions 0[]  1[x]  2[]       1c LOC Commands 0[]  1[]  2[]       2 Best Gaze 0[]  1[]  2[]       3 Visual 0[]  1[]  2[]  3[]      4 Facial Palsy 0[]  1[x]  2[]  3[]      5a Motor Arm - left 0[]  1[x]  2[]  3[]  4[]  UN[]    5b Motor Arm - Right 0[]  1[x]  2[]  3[]  4[]  UN[]    6a Motor Leg - Left 0[]  1[x]  2[]  3[]  4[]  UN[]    6b Motor Leg - Right 0[]  1[x]  2[]  3[]  4[]  UN[]    7 Limb Ataxia 0[]  1[]  2[]  UN[]      8 Sensory 0[]  1[]  2[]  UN[]      9 Best Language 0[]  1[]  2[]  3[]      10 Dysarthria 0[]  1[x]  2[]  UN[]      11 Extinct. and Inattention 0[]  1[]  2[]       TOTAL:  8      ROS   Comprehensive ROS performed and pertinent positives documented in HPI   Past History   Past Medical History:  Diagnosis Date   Acute respiratory failure with hypoxia (HCC) 08/26/2022   Arthritis    History of kidney stones    Hypertension    Hypokalemia 08/29/2022    Past Surgical History:  Procedure Laterality Date   COLONOSCOPY WITH PROPOFOL  N/A 12/27/2015   Procedure: COLONOSCOPY WITH  PROPOFOL ;  Surgeon: Rogelia Copping, MD;  Location: [facility];  Service: Endoscopy;  Laterality: N/A;   EXTRACORPOREAL SHOCK WAVE LITHOTRIPSY Left 07/28/2018   Procedure: EXTRACORPOREAL SHOCK WAVE LITHOTRIPSY (ESWL);  Surgeon: Kassie Ozell SAUNDERS, MD;  Location: [facility];  Service: Urology;  Laterality: Left;   EXTRACORPOREAL SHOCK WAVE LITHOTRIPSY Right 01/04/2020   Procedure: EXTRACORPOREAL SHOCK WAVE LITHOTRIPSY (ESWL);  Surgeon: Kassie Ozell SAUNDERS, MD;  Location: [facility];  Service: Urology;  Laterality: Right;   POLYPECTOMY  12/27/2015   Procedure: POLYPECTOMY;  Surgeon: Rogelia Copping, MD;  Location: [facility];  Service: Endoscopy;;   SACROILIAC JOINT FUSION Right 09/06/2013   Procedure: SACROILIAC JOINT FUSION;  Surgeon: Oneil Rodgers Priestly, MD;  Location: [facility] OR;  Service: Orthopedics;  Laterality: Right;  Right sided sacroiliac joint fusion   TOTAL SHOULDER ARTHROPLASTY Left    WRIST SURGERY Left     Family History: Family History  Problem Relation Age of Onset   Diabetes Mother    Arthritis Father    Diabetes Sister    Diabetes Sister     Social History  reports that he has never smoked.  He has never used smokeless tobacco. He reports that he does not drink alcohol and does not use drugs.  Allergies  Allergen Reactions   Prednisone Other (See Comments)    irritable  Other Reaction(s): Not available  Psychosis  Psychosis  Psychosis  Psychosis, Psychosis   Codeine Itching    Medications   Current Facility-Administered Medications:    acetaminophen  (TYLENOL ) tablet 650 mg, 650 mg, Oral, Q4H PRN **OR** acetaminophen  (TYLENOL ) 160 MG/5ML solution 650 mg, 650 mg, Per Tube, Q4H PRN **OR** acetaminophen  (TYLENOL ) suppository 650 mg, 650 mg, Rectal, Q4H PRN, Pokhrel, Laxman, MD, 650 mg at 06/23/24 0603   butalbital -acetaminophen -caffeine  (FIORICET ) 50-325-40 MG per tablet 1-2 tablet, 1-2 tablet, Oral, Q6H PRN, Pokhrel, Laxman, MD   rivaroxaban  (XARELTO ) tablet  20 mg, 20 mg, Oral, Q supper, Pokhrel, Laxman, MD   rosuvastatin  (CRESTOR ) tablet 10 mg, 10 mg, Oral, QHS, Pokhrel, Laxman, MD   spironolactone  (ALDACTONE ) tablet 50 mg, 50 mg, Oral, Daily, Pokhrel, Laxman, MD, 50 mg at 06/23/24 1143  Vitals   Vitals:   06/23/24 0424 06/23/24 0500 06/23/24 0759 06/23/24 1159  BP: (!) 125/47  125/85 129/69  Pulse: 74  74 (!) 101  Resp: 20  20 16   Temp: 98.2 F (36.8 C)  97.8 F (36.6 C) 97.8 F (36.6 C)  TempSrc: Oral   Oral  SpO2: 95%  99%   Weight:  (!) 164 kg    Height:        Body mass index is 47.7 kg/m.   Physical Exam    Gen: patient lying in bed, NAD CV: extremities appear well-perfused Resp: normal WOB  Neurologic exam MS: alert, oriented to self, hospital, age, but not month, follows commands Speech: mild dysarthria, no aphasia CN: PERRL, VFF, EOMI, sensation intact, L facial droop that appears volitional and is distractible, hearing intact to voice Motor: drift in all extremities but not to bed, effort dependent Sensory: SILT Reflexes: 2+ symm with toes down bilat Coordination: FNF intact bilat Gait: deferred  Labs/Imaging/Neurodiagnostic studies   CBC:  Recent Labs  Lab 07/08/24 1245 06/23/24 0522  WBC 5.9 7.0  NEUTROABS 3.9  --   HGB 15.2 15.2  HCT 46.2 46.0  MCV 89.9 91.3  PLT 197 185   Basic Metabolic Panel:  Lab Results  Component Value Date   NA 138 06/23/2024   K 3.9 06/23/2024   CO2 26 06/23/2024   GLUCOSE 133 (H) 06/23/2024   BUN 13 06/23/2024   CREATININE 0.92 06/23/2024   CALCIUM  8.6 (L) 06/23/2024   GFRNONAA >60 06/23/2024   GFRAA >60 06/18/2014   Lipid Panel:  Lab Results  Component Value Date   LDLCALC 49 06/23/2024   HgbA1c:  Lab Results  Component Value Date   HGBA1C 7.9 (H) July 08, 2024   Urine Drug Screen:     Component Value Date/Time   LABOPIA POSITIVE (A) 07-08-24 1841   COCAINSCRNUR NONE DETECTED 2024-07-08 1841   LABBENZ NONE DETECTED 2024-07-08 1841   AMPHETMU NONE  DETECTED 08-Jul-2024 1841   THCU NONE DETECTED July 08, 2024 1841   LABBARB NONE DETECTED 2024-07-08 1841    Alcohol Level     Component Value Date/Time   ETH <15 July 08, 2024 1245   INR  Lab Results  Component Value Date   INR 1.4 (H) July 08, 2024   APTT  Lab Results  Component Value Date   APTT 34 08-Jul-2024   AED levels: No results found for: PHENYTOIN, ZONISAMIDE, LAMOTRIGINE, LEVETIRACETA  CT Head without contrast(Personally reviewed): No acute  process  CT angio Head and Neck with contrast(Personally reviewed): No LVO or perfusion deficit  MRI Brain(Personally reviewed): 1. No acute intracranial abnormality. 2. Mild to moderate chronic microvascular ischemic change.  ASSESSMENT   Hector Hancock is a 58 y.o. male with hx of HTN, VTE on xarelto  who presents with L sided facial droop and difficulty speaking. Aside from his facial assymetry his exam is nonfocal and has functional features and poor effort. His face appears volitionally twisted and not a true droop. MRI brain performed after the stroke code showed no e/o stroke. Per EDP patient is having difficulty swallowing and will be admitted obs for SLP eval. Etiology of presenting sx is favored to be functional and requires no further inpatient neurologic workup.  RECOMMENDATIONS   - Neurology to sign off, please re-engage if additional neurologic concerns arise ______________________________________________________________________    Signed, Elida CHRISTELLA Ross, MD [facility]

## 2024-06-23 NOTE — Discharge Summary (Addendum)
 Physician Discharge Summary   Patient: Hector Hancock MRN: [id number] DOB: 12-13-1965  Admit date:     06/22/2024  Discharge date: 06/23/24  Discharge Physician: AIDA CHO   PCP: Pcp, No   Recommendations at discharge:   Follow-up with PCP in 1 to 2 weeks  Discharge Diagnoses: Principal Problem:   Left-sided weakness  Resolved Problems:   * No resolved hospital problems. [facility] Course:  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  He was admitted to the hospital for observation because of concern for acute stroke/TIA.  MRI brain did not show any evidence of acute stroke.  2D echo did not show any intracardiac thrombus and EF was estimated to be 50 to 55% and there was some evidence of grade 1 diastolic dysfunction though this was a technically difficult study.   She was evaluated by attending neurologist, Dr. Matthews.  She thinks that etiology of symptoms is functional and no additional workup is required. His symptoms have improved.  Speech and swallowing are normal.  Left-sided weakness and facial droop have improved though he still reports persistent mild numbness in the left arm.  He feels better and he wants to be discharged home today.  Vital signs are stable.    Patient is deemed stable for discharge to home today.        Consultants: Neurologist Procedures performed: None Disposition: Home Diet recommendation:  Discharge Diet Orders (From admission, onward)     Start     Ordered   06/23/24 0000  Diet - low sodium heart healthy        06/23/24 1644           Cardiac diet DISCHARGE MEDICATION: Allergies as of 06/23/2024       Reactions   Prednisone Other (See Comments)   irritable Other Reaction(s): Not available Psychosis   Psychosis Psychosis Psychosis, Psychosis   Codeine Itching        Medication List     TAKE these medications    benazepril 40 MG tablet Commonly known as: LOTENSIN Take 40 mg by mouth at bedtime.   colchicine 0.6 MG tablet Take 0.6 mg by mouth 2 (two) times daily.   diphenhydrAMINE  25 mg capsule Commonly known as: BENADRYL  Take 50 mg by mouth at bedtime as needed for itching.   DULoxetine 30 MG capsule Commonly known as: CYMBALTA Take 30 mg by mouth daily.   hydroxychloroquine  200 MG tablet Commonly known as: PLAQUENIL  Take 200 mg by mouth 2 (two) times daily.   metFORMIN 500 MG 24 hr tablet Commonly known as: GLUCOPHAGE-XR Take 500 mg by mouth 2 (two) times daily with a meal.   Ozempic (1 MG/DOSE) 4 MG/3ML Sopn Generic drug: Semaglutide (1 MG/DOSE) Inject 1 mg into the skin once a week.   rivaroxaban  20 MG Tabs tablet Commonly known as: XARELTO  Take 20 mg by mouth daily with supper.   rosuvastatin 10 MG tablet Commonly known as: CRESTOR Take 10 mg by mouth at bedtime.   spironolactone  50 MG tablet Commonly known as: ALDACTONE  Take 1 tablet by mouth daily.   Symbicort 160-4.5 MCG/ACT inhaler Generic drug: budesonide-formoterol Inhale 2 puffs into the lungs daily as needed.   tamsulosin  0.4 MG Caps capsule Commonly known as: FLOMAX  Take 0.4 mg by mouth daily.   torsemide   20 MG tablet Commonly known as: DEMADEX  Take 40 mg by mouth daily.        Discharge Exam: Filed Weights   06/22/24 1237 06/23/24 0500  Weight: (!) 164.2 kg (!) 164 kg   GEN: NAD SKIN: Warm and dry EYES: No pallor or icterus ENT: MMM CV: RRR PULM: CTA B ABD: soft, obese, NT, +BS CNS: AAO x 3, ? mild left hemiparesis (power 4/5 in left upper and lower extremities) EXT: Mild left leg edema.  No erythema or tenderness   Condition at discharge: good  The results of significant diagnostics from this hospitalization (including imaging, microbiology, ancillary and  laboratory) are listed below for reference.   Imaging Studies: ECHOCARDIOGRAM COMPLETE Result Date: 06/23/2024    ECHOCARDIOGRAM REPORT   Patient Name:   Hector Hancock Date of Exam: 06/23/2024 Medical Rec #:  [id number]     Height:       72.8 in Accession #:    [id number]    Weight:       361.6 lb Date of Birth:  01/12/1966      BSA:          2.763 m Patient Age:    58 years      BP:           125/47 mmHg Patient Gender: M             HR:           82 bpm. Exam Location:  [facility] Procedure: 2D Echo, Cardiac Doppler, Color Doppler and Intracardiac            Opacification Agent (Both Spectral and Color Flow Doppler were            utilized during procedure). Indications:     TIA G45.9  History:         Patient has prior history of Echocardiogram examinations, most                  recent 08/26/2022. TIA.  Sonographer:     Ashley McNeely-Sloane Referring Phys:  [id number] [facility] POKHREL Diagnosing Phys: Dwayne D Callwood MD IMPRESSIONS  1. TDS.  2. Left ventricular ejection fraction, by estimation, is 50 to 55%. The left ventricle has low normal function. The left ventricle has no regional wall motion abnormalities. The left ventricular internal cavity size was mildly dilated. Left ventricular diastolic parameters are consistent with Grade I diastolic dysfunction (impaired relaxation).  3. Right ventricular systolic function is normal. The right ventricular size is normal.  4. The mitral valve is normal in structure. Trivial mitral valve regurgitation.  5. The aortic valve is normal in structure. Aortic valve regurgitation is not visualized. Aortic valve sclerosis is present, with no evidence of aortic valve stenosis. Conclusion(s)/Recommendation(s): Poor windows for evaluation of left ventricular function by transthoracic echocardiography. Would recommend an alternative means of evaluation. No intracardiac source of embolism detected on this transthoracic study. Consider a transesophageal echocardiogram to exclude cardiac  source of embolism if clinically indicated. No left ventricular mural or apical thrombus/thrombi. FINDINGS  Left Ventricle: Left ventricular ejection fraction, by estimation, is 50 to 55%. The left ventricle has low normal function. The left ventricle has no regional wall motion abnormalities. Definity contrast agent was given IV to delineate the left ventricular endocardial borders. Strain was performed and the global longitudinal strain is indeterminate. The left ventricular internal cavity size was mildly dilated. There is borderline concentric left ventricular hypertrophy. Left ventricular diastolic parameters are consistent with  Grade I diastolic dysfunction (impaired relaxation). Right Ventricle: The right ventricular size is normal. No increase in right ventricular wall thickness. Right ventricular systolic function is normal. Left Atrium: Left atrial size was normal in size. Right Atrium: Right atrial size was normal in size. Pericardium: There is no evidence of pericardial effusion. Mitral Valve: The mitral valve is normal in structure. Trivial mitral valve regurgitation. MV peak gradient, 2.8 mmHg. The mean mitral valve gradient is 1.0 mmHg. Tricuspid Valve: The tricuspid valve is normal in structure. Tricuspid valve regurgitation is not demonstrated. Aortic Valve: The aortic valve is normal in structure. Aortic valve regurgitation is not visualized. Aortic valve sclerosis is present, with no evidence of aortic valve stenosis. Aortic valve mean gradient measures 1.0 mmHg. Aortic valve peak gradient measures 2.5 mmHg. Aortic valve area, by VTI measures 3.66 cm. Pulmonic Valve: The pulmonic valve was normal in structure. Pulmonic valve regurgitation is trivial. Aorta: The ascending aorta was not well visualized. IAS/Shunts: No atrial level shunt detected by color flow Doppler. Additional Comments: TDS. 3D was performed not requiring image post processing on an independent workstation and was indeterminate.   LEFT VENTRICLE PLAX 2D LVIDd:         5.34 cm      Diastology LVIDs:         2.96 cm      LV e' medial:    8.49 cm/s LV PW:         1.27 cm      LV E/e' medial:  7.2 LV IVS:        1.09 cm      LV e' lateral:   8.81 cm/s LVOT diam:     2.20 cm      LV E/e' lateral: 6.9 LV SV:         51 LV SV Index:   18 LVOT Area:     3.80 cm  LV Volumes (MOD) LV vol d, MOD A2C: 66.2 ml LV vol d, MOD A4C: 104.0 ml LV vol s, MOD A2C: 32.7 ml LV vol s, MOD A4C: 56.9 ml LV SV MOD A2C:     33.5 ml LV SV MOD A4C:     104.0 ml LV SV MOD BP:      40.4 ml RIGHT VENTRICLE RV S prime:     8.49 cm/s TAPSE (M-mode): 2.8 cm LEFT ATRIUM           Index LA diam:      4.10 cm 1.48 cm/m LA Vol (A2C): 20.2 ml 7.31 ml/m  AORTIC VALVE                    PULMONIC VALVE AV Area (Vmax):    3.82 cm     PV Vmax:        0.73 m/s AV Area (Vmean):   3.71 cm     PV Vmean:       49.300 cm/s AV Area (VTI):     3.66 cm     PV VTI:         0.128 m AV Vmax:           78.70 cm/s   PV Peak grad:   2.1 mmHg AV Vmean:          52.500 cm/s  PV Mean grad:   1.0 mmHg AV VTI:            0.138 m      RVOT Peak grad: 1 mmHg AV Peak  Grad:      2.5 mmHg AV Mean Grad:      1.0 mmHg LVOT Vmax:         79.00 cm/s LVOT Vmean:        51.300 cm/s LVOT VTI:          0.133 m LVOT/AV VTI ratio: 0.96  AORTA Ao Root diam: 3.70 cm Ao Asc diam:  3.50 cm MITRAL VALVE MV Area (PHT): 4.21 cm    SHUNTS MV Area VTI:   2.82 cm    Systemic VTI:  0.13 m MV Peak grad:  2.8 mmHg    Systemic Diam: 2.20 cm MV Mean grad:  1.0 mmHg    Pulmonic VTI:  0.115 m MV Vmax:       0.84 m/s MV Vmean:      54.9 cm/s MV Decel Time: 180 msec MV E velocity: 60.80 cm/s MV A velocity: 61.30 cm/s MV E/A ratio:  0.99 Dwayne D Callwood MD Electronically signed by Cara JONETTA Lovelace MD Signature Date/Time: 06/23/2024/4:30:31 PM    Final    MR BRAIN WO CONTRAST Result Date: 06/22/2024 EXAM: MRI BRAIN WITHOUT CONTRAST 06/22/2024 04:06:46 PM TECHNIQUE: Multiplanar multisequence MRI of the head/brain was performed  without the administration of intravenous contrast. COMPARISON: Same day CT head. MRI head 06/19/2014 CLINICAL HISTORY: Right sided weakness, aphasia. FINDINGS: BRAIN AND VENTRICLES: No acute infarct. No intracranial hemorrhage. No mass. No midline shift. No hydrocephalus. Scattered mild to moderate T2 hyperintensities in the white matter and pons are compatible with chronic microvascular ischemic change. Normal flow voids. ORBITS: No acute abnormality. SINUSES AND MASTOIDS: No acute abnormality. BONES AND SOFT TISSUES: Normal marrow signal. No acute soft tissue abnormality. IMPRESSION: 1. No acute intracranial abnormality. 2. Mild to moderate chronic microvascular ischemic change. Electronically signed by: Gilmore Molt MD 06/22/2024 04:29 PM EST RP Workstation: [id number]   CT ANGIO HEAD NECK W WO CM W PERF (CODE STROKE) LKW > 6h Result Date: 06/22/2024 EXAM: CTA Head and Neck with Perfusion 06/22/2024 01:06:33 PM TECHNIQUE: CTA of the head and neck was performed with and without the administration of 100 mL of intravenous iohexol  (OMNIPAQUE ) 350 MG/ML injection. 3D postprocessing with multiplanar reconstructions and MIPs was performed to evaluate the vascular anatomy. Cerebral perfusion analysis using computed tomography with contrast administration, including post-processing of parametric maps with determination of cerebral blood flow, cerebral blood volume, mean transit time and time-to-maximum. Automated exposure control, iterative reconstruction, and/or weight based adjustment of the mA/kV was utilized to reduce the radiation dose to as low as reasonably achievable. Relatively faint contrast bolus is present resulting in decreased conspicuity of distal MCA branch vessels. COMPARISON: None available CLINICAL HISTORY: Neuro deficit, acute, stroke suspected. Left sided weakness and facial droop. Bilateral leg weakness. Expressive aphasia. FINDINGS: CTA NECK: AORTIC ARCH AND ARCH VESSELS: Atherosclerotic  calcifications are present in the distal aortic arch without aneurysm or stenosis. No dissection or arterial injury. No significant stenosis of the brachiocephalic or subclavian arteries. CERVICAL CAROTID ARTERIES: Atherosclerotic calcifications are present at the right carotid bifurcation, right greater than left carotid bifurcation, without significant stenosis relative to the more distal vessel. No dissection or arterial injury. CERVICAL VERTEBRAL ARTERIES: The vertebral arteries are codominant. No dissection, arterial injury, or significant stenosis. LUNGS AND MEDIASTINUM: Unremarkable. SOFT TISSUES: No acute abnormality. BONES: No acute abnormality. CTA HEAD: ANTERIOR CIRCULATION: Relatively faint contrast bolus is present resulting in decreased conspicuity of distal MCA branch vessels. No significant stenosis of the internal carotid arteries. No significant stenosis  of the anterior cerebral arteries. No significant stenosis of the middle cerebral arteries. No aneurysm. POSTERIOR CIRCULATION: No significant stenosis of the posterior cerebral arteries. No significant stenosis of the basilar artery. No significant stenosis of the vertebral arteries. No aneurysm. OTHER: No dural venous sinus thrombosis on this non-dedicated study. CT PERFUSION: EXAM QUALITY: Exam quality is adequate with diagnostic perfusion maps. No significant motion artifact. Appropriate arterial inflow and venous outflow curves. CORE INFARCT (CBF<30% volume): 0 mL TOTAL HYPOPERFUSION (Tmax>6s volume): 0 mL PENUMBRA: Mismatch volume: 0 mL Mismatch ratio: not applicable Location: not applicable IMPRESSION: 1. No acute large vessel occlusion. 2. No hemodynamically significant stenosis or aneurysm in the head or neck vessels. 3. No evidence of ischemia on CT brain perfusion. Electronically signed by: Lonni Necessary MD 06/22/2024 01:31 PM EST RP Workstation: [id number]   CT HEAD CODE STROKE WO CONTRAST Result Date: 06/22/2024 EXAM: CT HEAD  WITHOUT CONTRAST 06/22/2024 01:00:01 PM TECHNIQUE: CT of the head was performed without the administration of intravenous contrast. Automated exposure control, iterative reconstruction, and/or weight based adjustment of the mA/kV was utilized to reduce the radiation dose to as low as reasonably achievable. COMPARISON: Brain MRI 06/19/2014, Head CT 06/18/2014. CLINICAL HISTORY: 58 year old male with acute neuro deficit, stroke suspected. FINDINGS: BRAIN AND VENTRICLES: No acute hemorrhage. No evidence of acute infarct. Cerebral volume remains normal for age. Indistinct new hypodensity at the anterior limb of the left internal capsule on series 3 image 20 compatible with age indeterminate small vessel disease. Although the abnormality is fairly circumscribed on coronal images (series 5 image 27) suggesting a chronic time course. Elsewhere gray white differentiation is within normal limits. No hydrocephalus. No extra-axial collection. No mass effect or midline shift. No suspicious intracranial vascular hyperdensity. ORBITS: No acute abnormality. No gaze deviation. SINUSES: Paranasal sinuses, mastoids, and middle ear cavities remain well aerated. SOFT TISSUES AND SKULL: No acute soft tissue abnormality. No skull fracture. [place] stroke program early CT score (aspects) ----- Ganglionic (caudate, ic, lentiform nucleus, insula, M1-m3): 7 Supraganglionic (m4-m6): 3 Total: 10 IMPRESSION: 1. No acute intracranial abnormality. ASPECTS 10. 2. left internal capsule small vessel disease, likely chronic. 3. These results were communicated to Dr. Matthews at 1305 hours on 06/22/2024 by text page via the [facility] messaging system. Electronically signed by: Helayne Hurst MD 06/22/2024 01:06 PM EST RP Workstation: [id number]    Microbiology: Results for orders placed or performed during the hospital encounter of 08/25/22  Resp panel by RT-PCR (RSV, Flu A&B, Covid) Anterior Nasal Swab     Status: None   Collection Time: 08/26/22 12:50 AM    Specimen: Anterior Nasal Swab  Result Value Ref Range Status   SARS Coronavirus 2 by RT PCR NEGATIVE NEGATIVE Final    Comment: (NOTE) SARS-CoV-2 target nucleic acids are NOT DETECTED.  The SARS-CoV-2 RNA is generally detectable in upper respiratory specimens during the acute phase of infection. The lowest concentration of SARS-CoV-2 viral copies this assay can detect is 138 copies/mL. A negative result does not preclude SARS-Cov-2 infection and should not be used as the sole basis for treatment or other patient management decisions. A negative result may occur with  improper specimen collection/handling, submission of specimen other than nasopharyngeal swab, presence of viral mutation(s) within the areas targeted by this assay, and inadequate number of viral copies(<138 copies/mL). A negative result must be combined with clinical observations, patient history, and epidemiological information. The expected result is Negative.  Fact Sheet for Patients:  bloggercourse.com  Fact Sheet for Healthcare Providers:  seriousbroker.it  This test is no t yet approved or cleared by the [place]  FDA and  has been authorized for detection and/or diagnosis of SARS-CoV-2 by FDA under an Emergency Use Authorization (EUA). This EUA will remain  in effect (meaning this test can be used) for the duration of the COVID-19 declaration under Section 564(b)(1) of the Act, 21 U.S.C.section 360bbb-3(b)(1), unless the authorization is terminated  or revoked sooner.       Influenza A by PCR NEGATIVE NEGATIVE Final   Influenza B by PCR NEGATIVE NEGATIVE Final    Comment: (NOTE) The Xpert Xpress SARS-CoV-2/FLU/RSV plus assay is intended as an aid in the diagnosis of influenza from Nasopharyngeal swab specimens and should not be used as a sole basis for treatment. Nasal washings and aspirates are unacceptable for Xpert Xpress  SARS-CoV-2/FLU/RSV testing.  Fact Sheet for Patients: bloggercourse.com  Fact Sheet for Healthcare Providers: seriousbroker.it  This test is not yet approved or cleared by the [place]  FDA and has been authorized for detection and/or diagnosis of SARS-CoV-2 by FDA under an Emergency Use Authorization (EUA). This EUA will remain in effect (meaning this test can be used) for the duration of the COVID-19 declaration under Section 564(b)(1) of the Act, 21 U.S.C. section 360bbb-3(b)(1), unless the authorization is terminated or revoked.     Resp Syncytial Virus by PCR NEGATIVE NEGATIVE Final    Comment: (NOTE) Fact Sheet for Patients: bloggercourse.com  Fact Sheet for Healthcare Providers: seriousbroker.it  This test is not yet approved or cleared by the [place]  FDA and has been authorized for detection and/or diagnosis of SARS-CoV-2 by FDA under an Emergency Use Authorization (EUA). This EUA will remain in effect (meaning this test can be used) for the duration of the COVID-19 declaration under Section 564(b)(1) of the Act, 21 U.S.C. section 360bbb-3(b)(1), unless the authorization is terminated or revoked.  Performed at [facility], [address] [place], [place] [address]     Labs: CBC: Recent Labs  Lab 06/22/24 1245 06/23/24 0522  WBC 5.9 7.0  NEUTROABS 3.9  --   HGB 15.2 15.2  HCT 46.2 46.0  MCV 89.9 91.3  PLT 197 185   Basic Metabolic Panel: Recent Labs  Lab 06/22/24 1245 06/23/24 0522  NA 136 138  K 3.8 3.9  CL 103 104  CO2 21* 26  GLUCOSE 209* 133*  BUN 14 13  CREATININE 0.91 0.92  CALCIUM 8.7* 8.6*  MG  --  2.2   Liver Function Tests: Recent Labs  Lab 06/22/24 1245  AST 41  ALT 39  ALKPHOS 62  BILITOT 0.9  PROT 7.5  ALBUMIN  3.8   CBG: Recent Labs  Lab 06/22/24 1242  GLUCAP 202*    Discharge time spent:  greater than 30 minutes.  Signed: AIDA CHO, MD [facility] 06/23/2024
